# Patient Record
Sex: Female | Born: 1935 | Race: White | Hispanic: No | Smoking: Never smoker
Health system: Southern US, Community
[De-identification: ages and names within clinical notes are randomized; demographics above are authoritative.]

## PROBLEM LIST (undated history)

## (undated) DIAGNOSIS — D61818 Other pancytopenia: Secondary | ICD-10-CM

## (undated) DIAGNOSIS — F419 Anxiety disorder, unspecified: Secondary | ICD-10-CM

## (undated) DIAGNOSIS — A0472 Enterocolitis due to Clostridium difficile, not specified as recurrent: Secondary | ICD-10-CM

## (undated) DIAGNOSIS — F32A Depression, unspecified: Secondary | ICD-10-CM

## (undated) DIAGNOSIS — K219 Gastro-esophageal reflux disease without esophagitis: Secondary | ICD-10-CM

## (undated) DIAGNOSIS — I519 Heart disease, unspecified: Secondary | ICD-10-CM

## (undated) DIAGNOSIS — D469 Myelodysplastic syndrome, unspecified: Secondary | ICD-10-CM

## (undated) DIAGNOSIS — E785 Hyperlipidemia, unspecified: Secondary | ICD-10-CM

## (undated) DIAGNOSIS — Z860100 Personal history of colon polyps, unspecified: Secondary | ICD-10-CM

## (undated) DIAGNOSIS — D649 Anemia, unspecified: Secondary | ICD-10-CM

## (undated) DIAGNOSIS — M75100 Unspecified rotator cuff tear or rupture of unspecified shoulder, not specified as traumatic: Secondary | ICD-10-CM

## (undated) DIAGNOSIS — F329 Major depressive disorder, single episode, unspecified: Secondary | ICD-10-CM

## (undated) DIAGNOSIS — K5790 Diverticulosis of intestine, part unspecified, without perforation or abscess without bleeding: Secondary | ICD-10-CM

## (undated) DIAGNOSIS — I639 Cerebral infarction, unspecified: Secondary | ICD-10-CM

## (undated) DIAGNOSIS — I10 Essential (primary) hypertension: Secondary | ICD-10-CM

## (undated) DIAGNOSIS — M1990 Unspecified osteoarthritis, unspecified site: Secondary | ICD-10-CM

## (undated) DIAGNOSIS — S72009A Fracture of unspecified part of neck of unspecified femur, initial encounter for closed fracture: Secondary | ICD-10-CM

## (undated) DIAGNOSIS — I7300 Raynaud's syndrome without gangrene: Secondary | ICD-10-CM

## (undated) DIAGNOSIS — I509 Heart failure, unspecified: Secondary | ICD-10-CM

## (undated) DIAGNOSIS — K2960 Other gastritis without bleeding: Secondary | ICD-10-CM

## (undated) DIAGNOSIS — K222 Esophageal obstruction: Secondary | ICD-10-CM

## (undated) DIAGNOSIS — E7211 Homocystinuria: Secondary | ICD-10-CM

## (undated) DIAGNOSIS — N189 Chronic kidney disease, unspecified: Secondary | ICD-10-CM

## (undated) HISTORY — DX: Raynaud's syndrome without gangrene: I73.00

## (undated) HISTORY — DX: Major depressive disorder, single episode, unspecified: F32.9

## (undated) HISTORY — DX: Other gastritis without bleeding: K29.60

## (undated) HISTORY — DX: Esophageal obstruction: K22.2

## (undated) HISTORY — PX: CATARACT EXTRACTION: SUR2

## (undated) HISTORY — PX: HIP FRACTURE SURGERY: SHX118

## (undated) HISTORY — DX: Depression, unspecified: F32.A

## (undated) HISTORY — PX: SPINE SURGERY: SHX786

## (undated) HISTORY — DX: Personal history of colonic polyps: Z86.010

## (undated) HISTORY — PX: KIDNEY SURGERY: SHX687

## (undated) HISTORY — DX: Anemia, unspecified: D64.9

## (undated) HISTORY — DX: Heart failure, unspecified: I50.9

## (undated) HISTORY — DX: Diverticulosis of intestine, part unspecified, without perforation or abscess without bleeding: K57.90

## (undated) HISTORY — DX: Homocystinuria: E72.11

## (undated) HISTORY — DX: Anxiety disorder, unspecified: F41.9

## (undated) HISTORY — DX: Chronic kidney disease, unspecified: N18.9

## (undated) HISTORY — DX: Essential (primary) hypertension: I10

## (undated) HISTORY — DX: Fracture of unspecified part of neck of unspecified femur, initial encounter for closed fracture: S72.009A

## (undated) HISTORY — DX: Gastro-esophageal reflux disease without esophagitis: K21.9

## (undated) HISTORY — DX: Heart disease, unspecified: I51.9

## (undated) HISTORY — DX: Enterocolitis due to Clostridium difficile, not specified as recurrent: A04.72

## (undated) HISTORY — DX: Unspecified osteoarthritis, unspecified site: M19.90

## (undated) HISTORY — PX: TUBAL LIGATION: SHX77

## (undated) HISTORY — DX: Cerebral infarction, unspecified: I63.9

## (undated) HISTORY — DX: Myelodysplastic syndrome, unspecified: D46.9

## (undated) HISTORY — DX: Other pancytopenia: D61.818

## (undated) HISTORY — DX: Hyperlipidemia, unspecified: E78.5

## (undated) HISTORY — PX: VARICOSE VEIN SURGERY: SHX832

## (undated) HISTORY — PX: ABDOMINAL HYSTERECTOMY: SHX81

## (undated) HISTORY — DX: Personal history of colon polyps, unspecified: Z86.0100

---

## 1997-09-24 ENCOUNTER — Ambulatory Visit (HOSPITAL_COMMUNITY): Admission: RE | Admit: 1997-09-24 | Discharge: 1997-09-24 | Payer: Self-pay | Admitting: Orthopedic Surgery

## 1998-08-20 ENCOUNTER — Other Ambulatory Visit: Admission: RE | Admit: 1998-08-20 | Discharge: 1998-08-20 | Payer: Self-pay | Admitting: Obstetrics and Gynecology

## 1998-09-18 ENCOUNTER — Ambulatory Visit (HOSPITAL_COMMUNITY): Admission: RE | Admit: 1998-09-18 | Discharge: 1998-09-18 | Payer: Self-pay | Admitting: Internal Medicine

## 1998-09-18 ENCOUNTER — Encounter: Payer: Self-pay | Admitting: Internal Medicine

## 1999-05-09 ENCOUNTER — Encounter: Payer: Self-pay | Admitting: Orthopedic Surgery

## 1999-05-09 ENCOUNTER — Encounter: Admission: RE | Admit: 1999-05-09 | Discharge: 1999-05-09 | Payer: Self-pay | Admitting: Orthopedic Surgery

## 1999-05-12 ENCOUNTER — Ambulatory Visit (HOSPITAL_BASED_OUTPATIENT_CLINIC_OR_DEPARTMENT_OTHER): Admission: RE | Admit: 1999-05-12 | Discharge: 1999-05-12 | Payer: Self-pay | Admitting: Orthopedic Surgery

## 1999-06-11 ENCOUNTER — Encounter: Payer: Self-pay | Admitting: Internal Medicine

## 1999-06-11 ENCOUNTER — Ambulatory Visit (HOSPITAL_COMMUNITY): Admission: RE | Admit: 1999-06-11 | Discharge: 1999-06-11 | Payer: Self-pay | Admitting: Internal Medicine

## 2000-02-25 ENCOUNTER — Emergency Department (HOSPITAL_COMMUNITY): Admission: EM | Admit: 2000-02-25 | Discharge: 2000-02-25 | Payer: Self-pay | Admitting: Emergency Medicine

## 2003-04-16 ENCOUNTER — Encounter: Admission: RE | Admit: 2003-04-16 | Discharge: 2003-04-16 | Payer: Self-pay

## 2003-09-14 ENCOUNTER — Inpatient Hospital Stay (HOSPITAL_COMMUNITY): Admission: EM | Admit: 2003-09-14 | Discharge: 2003-09-18 | Payer: Self-pay

## 2003-09-17 ENCOUNTER — Encounter: Payer: Self-pay | Admitting: Cardiology

## 2003-09-18 ENCOUNTER — Inpatient Hospital Stay
Admission: RE | Admit: 2003-09-18 | Discharge: 2003-09-28 | Payer: Self-pay | Admitting: Physical Medicine & Rehabilitation

## 2003-11-01 ENCOUNTER — Emergency Department (HOSPITAL_COMMUNITY): Admission: EM | Admit: 2003-11-01 | Discharge: 2003-11-01 | Payer: Self-pay | Admitting: Emergency Medicine

## 2003-11-05 ENCOUNTER — Encounter
Admission: RE | Admit: 2003-11-05 | Discharge: 2004-02-03 | Payer: Self-pay | Admitting: Physical Medicine & Rehabilitation

## 2003-11-08 ENCOUNTER — Emergency Department (HOSPITAL_COMMUNITY): Admission: EM | Admit: 2003-11-08 | Discharge: 2003-11-08 | Payer: Self-pay | Admitting: Emergency Medicine

## 2003-11-20 ENCOUNTER — Emergency Department (HOSPITAL_COMMUNITY): Admission: EM | Admit: 2003-11-20 | Discharge: 2003-11-20 | Payer: Self-pay | Admitting: *Deleted

## 2004-01-15 ENCOUNTER — Encounter: Admission: RE | Admit: 2004-01-15 | Discharge: 2004-03-13 | Payer: Self-pay | Admitting: Neurology

## 2004-02-25 ENCOUNTER — Encounter
Admission: RE | Admit: 2004-02-25 | Discharge: 2004-05-25 | Payer: Self-pay | Admitting: Physical Medicine & Rehabilitation

## 2004-02-26 ENCOUNTER — Ambulatory Visit: Payer: Self-pay | Admitting: Physical Medicine & Rehabilitation

## 2004-03-20 ENCOUNTER — Ambulatory Visit: Payer: Self-pay | Admitting: Family Medicine

## 2004-04-03 ENCOUNTER — Ambulatory Visit: Payer: Self-pay | Admitting: Family Medicine

## 2004-04-14 ENCOUNTER — Ambulatory Visit: Payer: Self-pay | Admitting: Cardiology

## 2004-04-17 ENCOUNTER — Ambulatory Visit: Payer: Self-pay | Admitting: Family Medicine

## 2004-04-17 ENCOUNTER — Ambulatory Visit: Payer: Self-pay

## 2004-05-03 ENCOUNTER — Emergency Department (HOSPITAL_COMMUNITY): Admission: EM | Admit: 2004-05-03 | Discharge: 2004-05-03 | Payer: Self-pay | Admitting: Emergency Medicine

## 2004-06-24 ENCOUNTER — Ambulatory Visit: Payer: Self-pay | Admitting: Family Medicine

## 2004-07-07 ENCOUNTER — Encounter: Admission: RE | Admit: 2004-07-07 | Discharge: 2004-07-07 | Payer: Self-pay | Admitting: Neurology

## 2004-07-21 ENCOUNTER — Inpatient Hospital Stay (HOSPITAL_COMMUNITY): Admission: EM | Admit: 2004-07-21 | Discharge: 2004-07-26 | Payer: Self-pay | Admitting: Emergency Medicine

## 2004-07-21 ENCOUNTER — Ambulatory Visit: Payer: Self-pay | Admitting: Internal Medicine

## 2004-07-25 ENCOUNTER — Ambulatory Visit: Payer: Self-pay | Admitting: Internal Medicine

## 2004-07-31 ENCOUNTER — Ambulatory Visit: Payer: Self-pay | Admitting: Internal Medicine

## 2004-08-05 ENCOUNTER — Ambulatory Visit: Payer: Self-pay | Admitting: Internal Medicine

## 2004-08-05 ENCOUNTER — Encounter: Admission: RE | Admit: 2004-08-05 | Discharge: 2004-08-05 | Payer: Self-pay | Admitting: Internal Medicine

## 2004-08-22 ENCOUNTER — Ambulatory Visit: Payer: Self-pay | Admitting: Internal Medicine

## 2004-08-29 ENCOUNTER — Emergency Department (HOSPITAL_COMMUNITY): Admission: EM | Admit: 2004-08-29 | Discharge: 2004-08-29 | Payer: Self-pay

## 2004-09-01 ENCOUNTER — Ambulatory Visit: Payer: Self-pay | Admitting: Internal Medicine

## 2004-10-29 ENCOUNTER — Ambulatory Visit: Payer: Self-pay | Admitting: Internal Medicine

## 2004-11-26 ENCOUNTER — Encounter: Admission: RE | Admit: 2004-11-26 | Discharge: 2005-01-28 | Payer: Self-pay | Admitting: Internal Medicine

## 2004-12-09 ENCOUNTER — Ambulatory Visit: Payer: Self-pay | Admitting: Internal Medicine

## 2004-12-15 ENCOUNTER — Encounter: Admission: RE | Admit: 2004-12-15 | Discharge: 2004-12-15 | Payer: Self-pay | Admitting: Internal Medicine

## 2004-12-25 ENCOUNTER — Ambulatory Visit: Payer: Self-pay | Admitting: Internal Medicine

## 2004-12-26 ENCOUNTER — Ambulatory Visit: Payer: Self-pay | Admitting: Internal Medicine

## 2004-12-26 ENCOUNTER — Encounter (INDEPENDENT_AMBULATORY_CARE_PROVIDER_SITE_OTHER): Payer: Self-pay | Admitting: *Deleted

## 2005-02-03 ENCOUNTER — Ambulatory Visit: Payer: Self-pay | Admitting: Internal Medicine

## 2005-02-19 ENCOUNTER — Ambulatory Visit: Payer: Self-pay | Admitting: Internal Medicine

## 2005-03-09 ENCOUNTER — Ambulatory Visit: Payer: Self-pay | Admitting: Internal Medicine

## 2005-03-22 ENCOUNTER — Emergency Department (HOSPITAL_COMMUNITY): Admission: EM | Admit: 2005-03-22 | Discharge: 2005-03-22 | Payer: Self-pay | Admitting: Emergency Medicine

## 2005-03-30 ENCOUNTER — Emergency Department (HOSPITAL_COMMUNITY): Admission: EM | Admit: 2005-03-30 | Discharge: 2005-03-30 | Payer: Self-pay | Admitting: Emergency Medicine

## 2005-04-02 ENCOUNTER — Encounter: Admission: RE | Admit: 2005-04-02 | Discharge: 2005-04-02 | Payer: Self-pay | Admitting: Urology

## 2005-04-15 ENCOUNTER — Observation Stay (HOSPITAL_COMMUNITY): Admission: RE | Admit: 2005-04-15 | Discharge: 2005-04-16 | Payer: Self-pay | Admitting: Interventional Radiology

## 2005-04-15 ENCOUNTER — Encounter (INDEPENDENT_AMBULATORY_CARE_PROVIDER_SITE_OTHER): Payer: Self-pay | Admitting: Specialist

## 2005-05-14 ENCOUNTER — Ambulatory Visit: Payer: Self-pay | Admitting: Internal Medicine

## 2005-05-21 ENCOUNTER — Encounter: Admission: RE | Admit: 2005-05-21 | Discharge: 2005-05-21 | Payer: Self-pay | Admitting: Interventional Radiology

## 2005-05-21 ENCOUNTER — Ambulatory Visit: Payer: Self-pay | Admitting: Internal Medicine

## 2005-05-23 ENCOUNTER — Encounter: Admission: RE | Admit: 2005-05-23 | Discharge: 2005-05-23 | Payer: Self-pay | Admitting: Interventional Radiology

## 2005-06-04 ENCOUNTER — Ambulatory Visit: Payer: Self-pay | Admitting: Internal Medicine

## 2005-06-11 ENCOUNTER — Encounter: Admission: RE | Admit: 2005-06-11 | Discharge: 2005-06-11 | Payer: Self-pay | Admitting: Interventional Radiology

## 2005-06-15 ENCOUNTER — Encounter: Admission: RE | Admit: 2005-06-15 | Discharge: 2005-06-15 | Payer: Self-pay | Admitting: Internal Medicine

## 2005-07-06 ENCOUNTER — Encounter (HOSPITAL_COMMUNITY): Admission: RE | Admit: 2005-07-06 | Discharge: 2005-10-04 | Payer: Self-pay | Admitting: Nephrology

## 2005-07-15 ENCOUNTER — Ambulatory Visit: Payer: Self-pay | Admitting: Internal Medicine

## 2005-07-29 ENCOUNTER — Ambulatory Visit: Payer: Self-pay | Admitting: Internal Medicine

## 2005-07-29 ENCOUNTER — Ambulatory Visit: Payer: Self-pay | Admitting: Physical Medicine & Rehabilitation

## 2005-07-29 ENCOUNTER — Inpatient Hospital Stay (HOSPITAL_COMMUNITY): Admission: EM | Admit: 2005-07-29 | Discharge: 2005-08-03 | Payer: Self-pay | Admitting: Emergency Medicine

## 2005-08-03 ENCOUNTER — Inpatient Hospital Stay (HOSPITAL_COMMUNITY)
Admission: RE | Admit: 2005-08-03 | Discharge: 2005-08-18 | Payer: Self-pay | Admitting: Physical Medicine & Rehabilitation

## 2005-08-03 ENCOUNTER — Ambulatory Visit: Payer: Self-pay | Admitting: Physical Medicine & Rehabilitation

## 2005-10-27 ENCOUNTER — Encounter: Admission: RE | Admit: 2005-10-27 | Discharge: 2005-10-27 | Payer: Self-pay | Admitting: Interventional Radiology

## 2005-12-24 ENCOUNTER — Emergency Department (HOSPITAL_COMMUNITY): Admission: EM | Admit: 2005-12-24 | Discharge: 2005-12-24 | Payer: Self-pay | Admitting: Emergency Medicine

## 2006-02-13 ENCOUNTER — Emergency Department (HOSPITAL_COMMUNITY): Admission: EM | Admit: 2006-02-13 | Discharge: 2006-02-13 | Payer: Self-pay | Admitting: Emergency Medicine

## 2006-03-05 ENCOUNTER — Ambulatory Visit: Payer: Self-pay | Admitting: Internal Medicine

## 2006-04-05 ENCOUNTER — Encounter: Payer: Self-pay | Admitting: Internal Medicine

## 2006-04-07 ENCOUNTER — Ambulatory Visit: Payer: Self-pay | Admitting: Internal Medicine

## 2006-04-29 ENCOUNTER — Encounter: Admission: RE | Admit: 2006-04-29 | Discharge: 2006-04-29 | Payer: Self-pay | Admitting: Interventional Radiology

## 2006-04-30 ENCOUNTER — Encounter (HOSPITAL_COMMUNITY): Admission: RE | Admit: 2006-04-30 | Discharge: 2006-05-27 | Payer: Self-pay | Admitting: Internal Medicine

## 2006-06-08 ENCOUNTER — Ambulatory Visit: Payer: Self-pay | Admitting: Internal Medicine

## 2006-06-10 ENCOUNTER — Ambulatory Visit: Payer: Self-pay | Admitting: Internal Medicine

## 2006-06-10 ENCOUNTER — Encounter: Payer: Self-pay | Admitting: Internal Medicine

## 2006-06-10 ENCOUNTER — Encounter (INDEPENDENT_AMBULATORY_CARE_PROVIDER_SITE_OTHER): Payer: Self-pay | Admitting: *Deleted

## 2006-07-02 ENCOUNTER — Ambulatory Visit: Payer: Self-pay | Admitting: Internal Medicine

## 2006-07-30 ENCOUNTER — Encounter (HOSPITAL_COMMUNITY): Admission: RE | Admit: 2006-07-30 | Discharge: 2006-08-02 | Payer: Self-pay | Admitting: Internal Medicine

## 2006-08-25 ENCOUNTER — Encounter: Payer: Self-pay | Admitting: Internal Medicine

## 2006-08-25 LAB — CONVERTED CEMR LAB: INR: 2

## 2006-09-21 DIAGNOSIS — I5020 Unspecified systolic (congestive) heart failure: Secondary | ICD-10-CM | POA: Insufficient documentation

## 2006-09-21 DIAGNOSIS — M810 Age-related osteoporosis without current pathological fracture: Secondary | ICD-10-CM | POA: Insufficient documentation

## 2006-09-21 DIAGNOSIS — I10 Essential (primary) hypertension: Secondary | ICD-10-CM | POA: Insufficient documentation

## 2006-09-21 DIAGNOSIS — H409 Unspecified glaucoma: Secondary | ICD-10-CM | POA: Insufficient documentation

## 2006-09-21 DIAGNOSIS — E785 Hyperlipidemia, unspecified: Secondary | ICD-10-CM

## 2006-10-11 ENCOUNTER — Ambulatory Visit: Payer: Self-pay | Admitting: Internal Medicine

## 2006-10-12 ENCOUNTER — Encounter: Payer: Self-pay | Admitting: Internal Medicine

## 2006-11-02 ENCOUNTER — Ambulatory Visit: Payer: Self-pay | Admitting: Internal Medicine

## 2006-11-09 ENCOUNTER — Ambulatory Visit: Payer: Self-pay | Admitting: Internal Medicine

## 2006-11-09 DIAGNOSIS — N189 Chronic kidney disease, unspecified: Secondary | ICD-10-CM

## 2006-11-09 DIAGNOSIS — I6350 Cerebral infarction due to unspecified occlusion or stenosis of unspecified cerebral artery: Secondary | ICD-10-CM | POA: Insufficient documentation

## 2006-11-09 DIAGNOSIS — D0910 Carcinoma in situ of unspecified urinary organ: Secondary | ICD-10-CM | POA: Insufficient documentation

## 2006-11-17 ENCOUNTER — Ambulatory Visit: Payer: Self-pay | Admitting: Internal Medicine

## 2006-11-18 ENCOUNTER — Encounter: Payer: Self-pay | Admitting: Internal Medicine

## 2006-11-19 ENCOUNTER — Telehealth (INDEPENDENT_AMBULATORY_CARE_PROVIDER_SITE_OTHER): Payer: Self-pay | Admitting: *Deleted

## 2006-11-22 ENCOUNTER — Encounter: Payer: Self-pay | Admitting: Internal Medicine

## 2006-11-22 LAB — CONVERTED CEMR LAB
Cholesterol: 188 mg/dL (ref 0–200)
HDL: 51 mg/dL (ref >39.0)
Total CHOL/HDL Ratio: 3.7
Triglycerides: 71 mg/dL (ref 0–149)

## 2006-11-23 ENCOUNTER — Encounter (INDEPENDENT_AMBULATORY_CARE_PROVIDER_SITE_OTHER): Payer: Self-pay | Admitting: *Deleted

## 2006-11-23 ENCOUNTER — Encounter: Payer: Self-pay | Admitting: Internal Medicine

## 2006-11-30 ENCOUNTER — Telehealth: Payer: Self-pay | Admitting: Internal Medicine

## 2006-12-07 ENCOUNTER — Ambulatory Visit: Payer: Self-pay

## 2006-12-07 ENCOUNTER — Encounter: Payer: Self-pay | Admitting: Internal Medicine

## 2006-12-10 ENCOUNTER — Telehealth: Payer: Self-pay | Admitting: Internal Medicine

## 2006-12-16 ENCOUNTER — Encounter (INDEPENDENT_AMBULATORY_CARE_PROVIDER_SITE_OTHER): Payer: Self-pay | Admitting: *Deleted

## 2006-12-18 ENCOUNTER — Telehealth (INDEPENDENT_AMBULATORY_CARE_PROVIDER_SITE_OTHER): Payer: Self-pay | Admitting: *Deleted

## 2007-01-06 ENCOUNTER — Telehealth: Payer: Self-pay | Admitting: Internal Medicine

## 2007-02-04 ENCOUNTER — Telehealth (INDEPENDENT_AMBULATORY_CARE_PROVIDER_SITE_OTHER): Payer: Self-pay | Admitting: *Deleted

## 2007-02-22 ENCOUNTER — Encounter: Payer: Self-pay | Admitting: Internal Medicine

## 2007-03-15 ENCOUNTER — Telehealth: Payer: Self-pay | Admitting: Internal Medicine

## 2007-03-16 ENCOUNTER — Encounter (INDEPENDENT_AMBULATORY_CARE_PROVIDER_SITE_OTHER): Payer: Self-pay | Admitting: *Deleted

## 2007-03-28 ENCOUNTER — Inpatient Hospital Stay (HOSPITAL_COMMUNITY): Admission: EM | Admit: 2007-03-28 | Discharge: 2007-04-01 | Payer: Self-pay | Admitting: Emergency Medicine

## 2007-03-30 ENCOUNTER — Ambulatory Visit: Payer: Self-pay | Admitting: Physical Medicine & Rehabilitation

## 2007-04-06 ENCOUNTER — Telehealth (INDEPENDENT_AMBULATORY_CARE_PROVIDER_SITE_OTHER): Payer: Self-pay | Admitting: *Deleted

## 2007-05-16 ENCOUNTER — Telehealth: Payer: Self-pay | Admitting: Internal Medicine

## 2007-05-19 ENCOUNTER — Telehealth (INDEPENDENT_AMBULATORY_CARE_PROVIDER_SITE_OTHER): Payer: Self-pay | Admitting: *Deleted

## 2007-05-24 ENCOUNTER — Ambulatory Visit: Payer: Self-pay | Admitting: Internal Medicine

## 2007-05-25 ENCOUNTER — Telehealth: Payer: Self-pay | Admitting: Internal Medicine

## 2007-05-31 ENCOUNTER — Telehealth (INDEPENDENT_AMBULATORY_CARE_PROVIDER_SITE_OTHER): Payer: Self-pay | Admitting: *Deleted

## 2007-05-31 ENCOUNTER — Encounter: Payer: Self-pay | Admitting: Internal Medicine

## 2007-06-28 ENCOUNTER — Telehealth: Payer: Self-pay | Admitting: Internal Medicine

## 2007-06-29 ENCOUNTER — Encounter: Payer: Self-pay | Admitting: Internal Medicine

## 2007-07-07 ENCOUNTER — Telehealth: Payer: Self-pay | Admitting: Internal Medicine

## 2007-07-08 ENCOUNTER — Telehealth (INDEPENDENT_AMBULATORY_CARE_PROVIDER_SITE_OTHER): Payer: Self-pay | Admitting: *Deleted

## 2007-07-14 ENCOUNTER — Encounter: Payer: Self-pay | Admitting: Internal Medicine

## 2007-07-19 ENCOUNTER — Encounter: Payer: Self-pay | Admitting: Internal Medicine

## 2007-07-26 ENCOUNTER — Encounter: Admission: RE | Admit: 2007-07-26 | Discharge: 2007-07-26 | Payer: Self-pay | Admitting: Interventional Radiology

## 2007-08-05 ENCOUNTER — Encounter (INDEPENDENT_AMBULATORY_CARE_PROVIDER_SITE_OTHER): Payer: Self-pay | Admitting: *Deleted

## 2007-08-05 ENCOUNTER — Encounter: Payer: Self-pay | Admitting: Internal Medicine

## 2007-08-11 ENCOUNTER — Encounter: Admission: RE | Admit: 2007-08-11 | Discharge: 2007-08-11 | Payer: Self-pay | Admitting: Interventional Radiology

## 2007-08-22 ENCOUNTER — Ambulatory Visit: Payer: Self-pay | Admitting: Internal Medicine

## 2007-08-22 DIAGNOSIS — F341 Dysthymic disorder: Secondary | ICD-10-CM

## 2007-08-26 ENCOUNTER — Telehealth (INDEPENDENT_AMBULATORY_CARE_PROVIDER_SITE_OTHER): Payer: Self-pay | Admitting: *Deleted

## 2007-08-29 ENCOUNTER — Encounter: Payer: Self-pay | Admitting: Internal Medicine

## 2007-09-01 ENCOUNTER — Telehealth (INDEPENDENT_AMBULATORY_CARE_PROVIDER_SITE_OTHER): Payer: Self-pay | Admitting: *Deleted

## 2007-09-16 ENCOUNTER — Telehealth (INDEPENDENT_AMBULATORY_CARE_PROVIDER_SITE_OTHER): Payer: Self-pay | Admitting: *Deleted

## 2007-09-19 ENCOUNTER — Ambulatory Visit: Payer: Self-pay | Admitting: Internal Medicine

## 2007-09-19 DIAGNOSIS — M109 Gout, unspecified: Secondary | ICD-10-CM

## 2007-09-23 ENCOUNTER — Telehealth (INDEPENDENT_AMBULATORY_CARE_PROVIDER_SITE_OTHER): Payer: Self-pay | Admitting: *Deleted

## 2007-09-23 LAB — CONVERTED CEMR LAB
ALT: 10 units/L (ref 0–35)
Albumin: 3.2 g/dL — ABNORMAL LOW (ref 3.5–5.2)
BUN: 51 mg/dL — ABNORMAL HIGH (ref 6–23)
Calcium: 9 mg/dL (ref 8.4–10.5)
Creatinine, Ser: 2 mg/dL — ABNORMAL HIGH (ref 0.4–1.2)
GFR calc Af Amer: 31 mL/min
Glucose, Bld: 114 mg/dL — ABNORMAL HIGH (ref 70–99)
Sodium: 139 meq/L (ref 135–145)
Total Protein: 6.3 g/dL (ref 6.0–8.3)
Uric Acid, Serum: 12 mg/dL — ABNORMAL HIGH (ref 2.4–7.0)

## 2007-09-26 ENCOUNTER — Telehealth (INDEPENDENT_AMBULATORY_CARE_PROVIDER_SITE_OTHER): Payer: Self-pay | Admitting: *Deleted

## 2007-09-28 ENCOUNTER — Ambulatory Visit: Payer: Self-pay | Admitting: Internal Medicine

## 2007-09-28 LAB — CONVERTED CEMR LAB
Bacteria, UA: NONE SEEN
Bilirubin Urine: NEGATIVE
Glucose, Urine, Semiquant: NEGATIVE
RBC / HPF: NONE SEEN (ref <3)
Specific Gravity, Urine: 1.01
pH: 5

## 2007-09-29 ENCOUNTER — Encounter: Payer: Self-pay | Admitting: Internal Medicine

## 2007-10-05 ENCOUNTER — Telehealth (INDEPENDENT_AMBULATORY_CARE_PROVIDER_SITE_OTHER): Payer: Self-pay | Admitting: *Deleted

## 2007-10-06 ENCOUNTER — Encounter: Payer: Self-pay | Admitting: Internal Medicine

## 2007-10-14 ENCOUNTER — Telehealth (INDEPENDENT_AMBULATORY_CARE_PROVIDER_SITE_OTHER): Payer: Self-pay | Admitting: *Deleted

## 2007-10-18 ENCOUNTER — Telehealth (INDEPENDENT_AMBULATORY_CARE_PROVIDER_SITE_OTHER): Payer: Self-pay | Admitting: *Deleted

## 2007-10-31 ENCOUNTER — Encounter: Payer: Self-pay | Admitting: Internal Medicine

## 2007-10-31 ENCOUNTER — Encounter: Admission: RE | Admit: 2007-10-31 | Discharge: 2007-10-31 | Payer: Self-pay | Admitting: Neurological Surgery

## 2007-11-17 ENCOUNTER — Telehealth: Payer: Self-pay | Admitting: Internal Medicine

## 2007-12-08 ENCOUNTER — Inpatient Hospital Stay (HOSPITAL_COMMUNITY): Admission: AD | Admit: 2007-12-08 | Discharge: 2007-12-09 | Payer: Self-pay | Admitting: Neurological Surgery

## 2008-01-09 ENCOUNTER — Encounter: Admission: RE | Admit: 2008-01-09 | Discharge: 2008-01-09 | Payer: Self-pay | Admitting: Neurological Surgery

## 2008-01-19 ENCOUNTER — Encounter: Payer: Self-pay | Admitting: Internal Medicine

## 2008-02-21 ENCOUNTER — Telehealth: Payer: Self-pay | Admitting: Internal Medicine

## 2008-02-24 ENCOUNTER — Encounter: Admission: RE | Admit: 2008-02-24 | Discharge: 2008-02-24 | Payer: Self-pay | Admitting: Neurological Surgery

## 2008-03-09 DIAGNOSIS — Z8719 Personal history of other diseases of the digestive system: Secondary | ICD-10-CM

## 2008-03-09 DIAGNOSIS — K5730 Diverticulosis of large intestine without perforation or abscess without bleeding: Secondary | ICD-10-CM | POA: Insufficient documentation

## 2008-03-09 DIAGNOSIS — K219 Gastro-esophageal reflux disease without esophagitis: Secondary | ICD-10-CM | POA: Insufficient documentation

## 2008-03-09 DIAGNOSIS — K449 Diaphragmatic hernia without obstruction or gangrene: Secondary | ICD-10-CM | POA: Insufficient documentation

## 2008-03-13 ENCOUNTER — Telehealth (INDEPENDENT_AMBULATORY_CARE_PROVIDER_SITE_OTHER): Payer: Self-pay | Admitting: *Deleted

## 2008-03-14 ENCOUNTER — Encounter: Payer: Self-pay | Admitting: Internal Medicine

## 2008-04-06 ENCOUNTER — Telehealth (INDEPENDENT_AMBULATORY_CARE_PROVIDER_SITE_OTHER): Payer: Self-pay | Admitting: *Deleted

## 2008-04-09 ENCOUNTER — Ambulatory Visit: Payer: Self-pay | Admitting: Internal Medicine

## 2008-04-12 ENCOUNTER — Ambulatory Visit: Payer: Self-pay | Admitting: Internal Medicine

## 2008-04-19 ENCOUNTER — Ambulatory Visit: Payer: Self-pay | Admitting: Internal Medicine

## 2008-04-23 ENCOUNTER — Telehealth: Payer: Self-pay | Admitting: Internal Medicine

## 2008-04-24 ENCOUNTER — Telehealth (INDEPENDENT_AMBULATORY_CARE_PROVIDER_SITE_OTHER): Payer: Self-pay | Admitting: *Deleted

## 2008-04-24 LAB — CONVERTED CEMR LAB
TSH: 1.51 microintl units/mL (ref 0.35–5.50)
Total CHOL/HDL Ratio: 3.5

## 2008-05-01 ENCOUNTER — Encounter: Admission: RE | Admit: 2008-05-01 | Discharge: 2008-05-01 | Payer: Self-pay | Admitting: Neurological Surgery

## 2008-05-08 ENCOUNTER — Telehealth (INDEPENDENT_AMBULATORY_CARE_PROVIDER_SITE_OTHER): Payer: Self-pay | Admitting: *Deleted

## 2008-05-09 ENCOUNTER — Ambulatory Visit: Payer: Self-pay | Admitting: Family Medicine

## 2008-05-24 ENCOUNTER — Telehealth: Payer: Self-pay | Admitting: Family Medicine

## 2008-05-24 ENCOUNTER — Encounter (INDEPENDENT_AMBULATORY_CARE_PROVIDER_SITE_OTHER): Payer: Self-pay | Admitting: *Deleted

## 2008-05-26 LAB — CONVERTED CEMR LAB
CO2: 32 meq/L (ref 19–32)
Calcium: 8.9 mg/dL (ref 8.4–10.5)
Chloride: 107 meq/L (ref 96–112)
Creatinine, Ser: 2 mg/dL — ABNORMAL HIGH (ref 0.4–1.2)
GFR calc non Af Amer: 25 mL/min
Sodium: 143 meq/L (ref 135–145)
Uric Acid, Serum: 7.5 mg/dL — ABNORMAL HIGH (ref 2.4–7.0)

## 2008-06-08 ENCOUNTER — Telehealth (INDEPENDENT_AMBULATORY_CARE_PROVIDER_SITE_OTHER): Payer: Self-pay | Admitting: *Deleted

## 2008-06-11 ENCOUNTER — Telehealth (INDEPENDENT_AMBULATORY_CARE_PROVIDER_SITE_OTHER): Payer: Self-pay | Admitting: *Deleted

## 2008-06-14 ENCOUNTER — Ambulatory Visit: Payer: Self-pay | Admitting: Internal Medicine

## 2008-06-26 ENCOUNTER — Encounter: Admission: RE | Admit: 2008-06-26 | Discharge: 2008-06-26 | Payer: Self-pay | Admitting: Interventional Radiology

## 2008-06-26 ENCOUNTER — Ambulatory Visit (HOSPITAL_COMMUNITY): Admission: RE | Admit: 2008-06-26 | Discharge: 2008-06-26 | Payer: Self-pay | Admitting: Interventional Radiology

## 2008-07-19 ENCOUNTER — Encounter: Admission: RE | Admit: 2008-07-19 | Discharge: 2008-07-19 | Payer: Self-pay | Admitting: Internal Medicine

## 2008-07-23 ENCOUNTER — Encounter (INDEPENDENT_AMBULATORY_CARE_PROVIDER_SITE_OTHER): Payer: Self-pay | Admitting: *Deleted

## 2008-07-26 ENCOUNTER — Telehealth (INDEPENDENT_AMBULATORY_CARE_PROVIDER_SITE_OTHER): Payer: Self-pay | Admitting: *Deleted

## 2008-09-11 ENCOUNTER — Encounter: Payer: Self-pay | Admitting: Internal Medicine

## 2008-09-17 ENCOUNTER — Telehealth (INDEPENDENT_AMBULATORY_CARE_PROVIDER_SITE_OTHER): Payer: Self-pay | Admitting: *Deleted

## 2008-09-21 ENCOUNTER — Ambulatory Visit: Payer: Self-pay | Admitting: Oncology

## 2008-09-28 ENCOUNTER — Ambulatory Visit: Payer: Self-pay | Admitting: Internal Medicine

## 2008-10-03 ENCOUNTER — Other Ambulatory Visit: Admission: RE | Admit: 2008-10-03 | Discharge: 2008-10-03 | Payer: Self-pay | Admitting: Oncology

## 2008-10-03 ENCOUNTER — Encounter: Payer: Self-pay | Admitting: Oncology

## 2008-10-03 ENCOUNTER — Encounter: Payer: Self-pay | Admitting: Internal Medicine

## 2008-10-03 LAB — CBC WITH DIFFERENTIAL/PLATELET
Basophils Absolute: 0 10*3/uL (ref 0.0–0.1)
Eosinophils Absolute: 0.1 10*3/uL (ref 0.0–0.5)
HCT: 27 % — ABNORMAL LOW (ref 34.8–46.6)
HGB: 9.5 g/dL — ABNORMAL LOW (ref 11.6–15.9)
MONO#: 0.1 10*3/uL (ref 0.1–0.9)
NEUT#: 0.9 10*3/uL — ABNORMAL LOW (ref 1.5–6.5)
NEUT%: 31.7 % — ABNORMAL LOW (ref 38.4–76.8)
WBC: 2.9 10*3/uL — ABNORMAL LOW (ref 3.9–10.3)
lymph#: 1.8 10*3/uL (ref 0.9–3.3)

## 2008-10-03 LAB — COMPREHENSIVE METABOLIC PANEL
ALT: 8 U/L (ref 0–35)
Albumin: 3.8 g/dL (ref 3.5–5.2)
CO2: 24 mEq/L (ref 19–32)
Glucose, Bld: 94 mg/dL (ref 70–99)
Potassium: 5.2 mEq/L (ref 3.5–5.3)
Sodium: 142 mEq/L (ref 135–145)
Total Protein: 6.5 g/dL (ref 6.0–8.3)

## 2008-10-03 LAB — LACTATE DEHYDROGENASE: LDH: 183 U/L (ref 94–250)

## 2008-10-09 LAB — FLOW CYTOMETRY

## 2008-10-15 ENCOUNTER — Ambulatory Visit (HOSPITAL_COMMUNITY): Admission: RE | Admit: 2008-10-15 | Discharge: 2008-10-15 | Payer: Self-pay | Admitting: Oncology

## 2008-10-15 ENCOUNTER — Ambulatory Visit: Payer: Self-pay | Admitting: Oncology

## 2008-10-15 ENCOUNTER — Encounter: Payer: Self-pay | Admitting: Oncology

## 2008-10-19 ENCOUNTER — Encounter: Payer: Self-pay | Admitting: Internal Medicine

## 2008-10-22 ENCOUNTER — Encounter: Admission: RE | Admit: 2008-10-22 | Discharge: 2008-12-06 | Payer: Self-pay | Admitting: Internal Medicine

## 2008-11-02 ENCOUNTER — Telehealth (INDEPENDENT_AMBULATORY_CARE_PROVIDER_SITE_OTHER): Payer: Self-pay | Admitting: *Deleted

## 2008-11-08 ENCOUNTER — Encounter: Payer: Self-pay | Admitting: Internal Medicine

## 2008-11-16 ENCOUNTER — Telehealth: Payer: Self-pay | Admitting: Internal Medicine

## 2008-11-19 ENCOUNTER — Ambulatory Visit: Payer: Self-pay | Admitting: Internal Medicine

## 2008-11-19 LAB — CONVERTED CEMR LAB
Blood in Urine, dipstick: NEGATIVE
Glucose, Urine, Semiquant: NEGATIVE
Ketones, urine, test strip: NEGATIVE
Nitrite: NEGATIVE
WBC Urine, dipstick: NEGATIVE
pH: 5

## 2008-11-20 ENCOUNTER — Encounter: Payer: Self-pay | Admitting: Internal Medicine

## 2008-11-20 ENCOUNTER — Ambulatory Visit: Payer: Self-pay | Admitting: Oncology

## 2008-11-20 LAB — CONVERTED CEMR LAB
RBC / HPF: NONE SEEN (ref <3)
WBC, UA: NONE SEEN cells/hpf (ref <3)

## 2008-11-22 ENCOUNTER — Encounter: Payer: Self-pay | Admitting: Internal Medicine

## 2008-11-22 LAB — CBC WITH DIFFERENTIAL/PLATELET
BASO%: 0.4 % (ref 0.0–2.0)
EOS%: 3.1 % (ref 0.0–7.0)
HCT: 27.3 % — ABNORMAL LOW (ref 34.8–46.6)
HGB: 9.6 g/dL — ABNORMAL LOW (ref 11.6–15.9)
MCH: 39.1 pg — ABNORMAL HIGH (ref 25.1–34.0)
MCHC: 35.3 g/dL (ref 31.5–36.0)
MONO#: 0.1 10*3/uL (ref 0.1–0.9)
NEUT%: 35.2 % — ABNORMAL LOW (ref 38.4–76.8)
RDW: 14.6 % — ABNORMAL HIGH (ref 11.2–14.5)
WBC: 2.4 10*3/uL — ABNORMAL LOW (ref 3.9–10.3)
lymph#: 1.4 10*3/uL (ref 0.9–3.3)

## 2008-11-26 LAB — SPEP & IFE WITH QIG
Albumin ELP: 59.6 % (ref 55.8–66.1)
Alpha-1-Globulin: 7 % — ABNORMAL HIGH (ref 2.9–4.9)
Beta 2: 3 % — ABNORMAL LOW (ref 3.2–6.5)
IgM, Serum: 76 mg/dL (ref 60–263)
Total Protein, Serum Electrophoresis: 6.5 g/dL (ref 6.0–8.3)

## 2008-11-26 LAB — DIRECT ANTIGLOBULIN TEST (NOT AT ARMC): DAT (Complement): NEGATIVE

## 2008-11-26 LAB — ANA: Anti Nuclear Antibody(ANA): NEGATIVE

## 2008-12-06 ENCOUNTER — Telehealth (INDEPENDENT_AMBULATORY_CARE_PROVIDER_SITE_OTHER): Payer: Self-pay | Admitting: *Deleted

## 2008-12-10 ENCOUNTER — Telehealth (INDEPENDENT_AMBULATORY_CARE_PROVIDER_SITE_OTHER): Payer: Self-pay | Admitting: *Deleted

## 2008-12-11 ENCOUNTER — Encounter: Payer: Self-pay | Admitting: Internal Medicine

## 2008-12-28 ENCOUNTER — Telehealth (INDEPENDENT_AMBULATORY_CARE_PROVIDER_SITE_OTHER): Payer: Self-pay | Admitting: *Deleted

## 2008-12-31 ENCOUNTER — Telehealth (INDEPENDENT_AMBULATORY_CARE_PROVIDER_SITE_OTHER): Payer: Self-pay | Admitting: *Deleted

## 2009-01-11 ENCOUNTER — Telehealth (INDEPENDENT_AMBULATORY_CARE_PROVIDER_SITE_OTHER): Payer: Self-pay | Admitting: *Deleted

## 2009-01-23 ENCOUNTER — Encounter: Admission: RE | Admit: 2009-01-23 | Discharge: 2009-01-23 | Payer: Self-pay | Admitting: Orthopedic Surgery

## 2009-01-25 ENCOUNTER — Ambulatory Visit: Payer: Self-pay | Admitting: Oncology

## 2009-01-29 ENCOUNTER — Encounter: Payer: Self-pay | Admitting: Internal Medicine

## 2009-01-29 LAB — CBC WITH DIFFERENTIAL/PLATELET
Basophils Absolute: 0 10*3/uL (ref 0.0–0.1)
EOS%: 2.4 % (ref 0.0–7.0)
Eosinophils Absolute: 0.1 10*3/uL (ref 0.0–0.5)
HCT: 23.4 % — ABNORMAL LOW (ref 34.8–46.6)
HGB: 8.3 g/dL — ABNORMAL LOW (ref 11.6–15.9)
MCH: 40 pg — ABNORMAL HIGH (ref 25.1–34.0)
MCV: 112.9 fL — ABNORMAL HIGH (ref 79.5–101.0)
MONO%: 1.6 % (ref 0.0–14.0)
NEUT#: 0.7 10*3/uL — ABNORMAL LOW (ref 1.5–6.5)
NEUT%: 29.7 % — ABNORMAL LOW (ref 38.4–76.8)
Platelets: 51 10*3/uL — ABNORMAL LOW (ref 145–400)

## 2009-01-29 LAB — FOLATE: Folate: >20 ng/mL

## 2009-01-29 LAB — IRON AND TIBC
%SAT: 42 % (ref 20–55)
UIBC: 157 ug/dL

## 2009-02-15 ENCOUNTER — Encounter: Payer: Self-pay | Admitting: Internal Medicine

## 2009-02-15 ENCOUNTER — Ambulatory Visit: Payer: Self-pay | Admitting: Oncology

## 2009-02-15 LAB — CBC WITH DIFFERENTIAL/PLATELET
Basophils Absolute: 0 10*3/uL (ref 0.0–0.1)
EOS%: 0.8 % (ref 0.0–7.0)
Eosinophils Absolute: 0 10*3/uL (ref 0.0–0.5)
HCT: 21.5 % — ABNORMAL LOW (ref 34.8–46.6)
HGB: 8.2 g/dL — ABNORMAL LOW (ref 11.6–15.9)
MCH: 43.5 pg — ABNORMAL HIGH (ref 25.1–34.0)
NEUT#: 0.9 10*3/uL — ABNORMAL LOW (ref 1.5–6.5)
NEUT%: 35.9 % — ABNORMAL LOW (ref 38.4–76.8)
lymph#: 1.5 10*3/uL (ref 0.9–3.3)

## 2009-02-25 ENCOUNTER — Encounter (HOSPITAL_COMMUNITY): Admission: RE | Admit: 2009-02-25 | Discharge: 2009-02-28 | Payer: Self-pay | Admitting: Oncology

## 2009-02-25 ENCOUNTER — Ambulatory Visit: Payer: Self-pay | Admitting: Oncology

## 2009-02-25 LAB — CBC WITH DIFFERENTIAL/PLATELET
Basophils Absolute: 0 10*3/uL (ref 0.0–0.1)
Eosinophils Absolute: 0 10*3/uL (ref 0.0–0.5)
HCT: 21.7 % — ABNORMAL LOW (ref 34.8–46.6)
HGB: 7.6 g/dL — ABNORMAL LOW (ref 11.6–15.9)
LYMPH%: 62.6 % — ABNORMAL HIGH (ref 14.0–49.7)
MCV: 115.6 fL — ABNORMAL HIGH (ref 79.5–101.0)
MONO#: 0 10*3/uL — ABNORMAL LOW (ref 0.1–0.9)
MONO%: 1.9 % (ref 0.0–14.0)
NEUT#: 0.7 10*3/uL — ABNORMAL LOW (ref 1.5–6.5)
NEUT%: 33.4 % — ABNORMAL LOW (ref 38.4–76.8)
Platelets: 39 10*3/uL — ABNORMAL LOW (ref 145–400)
RBC: 1.87 10*6/uL — ABNORMAL LOW (ref 3.70–5.45)

## 2009-03-14 ENCOUNTER — Encounter: Payer: Self-pay | Admitting: Internal Medicine

## 2009-03-14 LAB — CBC WITH DIFFERENTIAL/PLATELET
BASO%: 0.2 % (ref 0.0–2.0)
HCT: 24.6 % — ABNORMAL LOW (ref 34.8–46.6)
MCHC: 35.8 g/dL (ref 31.5–36.0)
MONO#: 0 10*3/uL — ABNORMAL LOW (ref 0.1–0.9)
RBC: 2.3 10*6/uL — ABNORMAL LOW (ref 3.70–5.45)
WBC: 2.8 10*3/uL — ABNORMAL LOW (ref 3.9–10.3)
lymph#: 1.7 10*3/uL (ref 0.9–3.3)

## 2009-03-14 LAB — RENAL FUNCTION PANEL
CO2: 23 mEq/L (ref 19–32)
Chloride: 105 mEq/L (ref 96–112)
Sodium: 138 mEq/L (ref 135–145)

## 2009-03-18 ENCOUNTER — Encounter: Payer: Self-pay | Admitting: Internal Medicine

## 2009-03-18 LAB — CONVERTED CEMR LAB
Calcium: 9.8 mg/dL
Glucose, Bld: 98 mg/dL
Sodium, serum: 140 mmol/L

## 2009-03-22 ENCOUNTER — Encounter: Payer: Self-pay | Admitting: Internal Medicine

## 2009-03-22 LAB — CONVERTED CEMR LAB
BUN: 63 mg/dL
CO2, serum: 28 mmol/L
Calcium: 9.2 mg/dL
Creatinine, Ser: 2.29 mg/dL
Glucose, Bld: 83 mg/dL
Sodium, serum: 141 mmol/L

## 2009-03-28 ENCOUNTER — Ambulatory Visit: Payer: Self-pay | Admitting: Oncology

## 2009-03-28 LAB — CBC WITH DIFFERENTIAL/PLATELET
Eosinophils Absolute: 0.1 10*3/uL (ref 0.0–0.5)
HCT: 24 % — ABNORMAL LOW (ref 34.8–46.6)
LYMPH%: 77.2 % — ABNORMAL HIGH (ref 14.0–49.7)
MONO#: 0.1 10*3/uL (ref 0.1–0.9)
NEUT#: 0.6 10*3/uL — ABNORMAL LOW (ref 1.5–6.5)
NEUT%: 18.6 % — ABNORMAL LOW (ref 38.4–76.8)
Platelets: 39 10*3/uL — ABNORMAL LOW (ref 145–400)
RBC: 2.27 10*6/uL — ABNORMAL LOW (ref 3.70–5.45)
WBC: 3.4 10*3/uL — ABNORMAL LOW (ref 3.9–10.3)
nRBC: 0 % (ref 0–0)

## 2009-04-08 ENCOUNTER — Ambulatory Visit: Payer: Self-pay | Admitting: Internal Medicine

## 2009-04-08 DIAGNOSIS — D469 Myelodysplastic syndrome, unspecified: Secondary | ICD-10-CM

## 2009-04-09 ENCOUNTER — Telehealth: Payer: Self-pay | Admitting: Internal Medicine

## 2009-04-11 ENCOUNTER — Telehealth (INDEPENDENT_AMBULATORY_CARE_PROVIDER_SITE_OTHER): Payer: Self-pay | Admitting: *Deleted

## 2009-04-11 ENCOUNTER — Encounter: Payer: Self-pay | Admitting: Internal Medicine

## 2009-04-11 ENCOUNTER — Encounter (HOSPITAL_COMMUNITY): Admission: RE | Admit: 2009-04-11 | Discharge: 2009-05-30 | Payer: Self-pay | Admitting: Oncology

## 2009-04-11 LAB — CBC WITH DIFFERENTIAL/PLATELET
BASO%: 0.4 % (ref 0.0–2.0)
EOS%: 4.3 % (ref 0.0–7.0)
HCT: 20.3 % — ABNORMAL LOW (ref 34.8–46.6)
MCH: 39 pg — ABNORMAL HIGH (ref 25.1–34.0)
MCHC: 35 g/dL (ref 31.5–36.0)
NEUT%: 19.5 % — ABNORMAL LOW (ref 38.4–76.8)
RBC: 1.82 10*6/uL — ABNORMAL LOW (ref 3.70–5.45)
RDW: 22.4 % — ABNORMAL HIGH (ref 11.2–14.5)
lymph#: 1.5 10*3/uL (ref 0.9–3.3)

## 2009-04-12 LAB — VITAMIN B12: Vitamin B-12: 623 pg/mL (ref 211–911)

## 2009-04-12 LAB — TYPE & CROSSMATCH - CHCC

## 2009-04-13 ENCOUNTER — Telehealth: Payer: Self-pay | Admitting: Family Medicine

## 2009-04-15 ENCOUNTER — Telehealth: Payer: Self-pay | Admitting: Internal Medicine

## 2009-04-16 ENCOUNTER — Telehealth: Payer: Self-pay | Admitting: Internal Medicine

## 2009-04-17 ENCOUNTER — Telehealth: Payer: Self-pay | Admitting: Internal Medicine

## 2009-04-23 ENCOUNTER — Ambulatory Visit: Payer: Self-pay | Admitting: Internal Medicine

## 2009-04-24 ENCOUNTER — Encounter: Payer: Self-pay | Admitting: Internal Medicine

## 2009-04-24 LAB — CBC WITH DIFFERENTIAL/PLATELET
BASO%: 0.2 % (ref 0.0–2.0)
LYMPH%: 43.9 % (ref 14.0–49.7)
MCHC: 34.2 g/dL (ref 31.5–36.0)
MONO#: 0 10*3/uL — ABNORMAL LOW (ref 0.1–0.9)
MONO%: 1.1 % (ref 0.0–14.0)
Platelets: 35 10*3/uL — ABNORMAL LOW (ref 145–400)
RBC: 2.6 10*6/uL — ABNORMAL LOW (ref 3.70–5.45)
WBC: 1.9 10*3/uL — ABNORMAL LOW (ref 3.9–10.3)

## 2009-04-28 ENCOUNTER — Telehealth: Payer: Self-pay | Admitting: Family Medicine

## 2009-04-29 ENCOUNTER — Telehealth: Payer: Self-pay | Admitting: Internal Medicine

## 2009-04-29 ENCOUNTER — Ambulatory Visit: Payer: Self-pay | Admitting: Internal Medicine

## 2009-04-30 ENCOUNTER — Telehealth (INDEPENDENT_AMBULATORY_CARE_PROVIDER_SITE_OTHER): Payer: Self-pay | Admitting: *Deleted

## 2009-04-30 LAB — CONVERTED CEMR LAB
CO2: 31 meq/L (ref 19–32)
Chloride: 105 meq/L (ref 96–112)
Creatinine, Ser: 1.7 mg/dL — ABNORMAL HIGH (ref 0.4–1.2)
Potassium: 4.2 meq/L (ref 3.5–5.1)
Sodium: 143 meq/L (ref 135–145)

## 2009-05-01 ENCOUNTER — Encounter: Payer: Self-pay | Admitting: Internal Medicine

## 2009-05-01 ENCOUNTER — Telehealth: Payer: Self-pay | Admitting: Internal Medicine

## 2009-05-03 ENCOUNTER — Telehealth: Payer: Self-pay | Admitting: Internal Medicine

## 2009-05-06 ENCOUNTER — Ambulatory Visit: Payer: Self-pay | Admitting: Oncology

## 2009-05-07 ENCOUNTER — Ambulatory Visit: Payer: Self-pay | Admitting: Internal Medicine

## 2009-05-07 ENCOUNTER — Telehealth: Payer: Self-pay | Admitting: Internal Medicine

## 2009-05-08 LAB — CONVERTED CEMR LAB
BUN: 60 mg/dL — ABNORMAL HIGH (ref 6–23)
CO2: 29 meq/L (ref 19–32)
Chloride: 107 meq/L (ref 96–112)
GFR calc non Af Amer: 20.55 mL/min (ref >60)
Glucose, Bld: 101 mg/dL — ABNORMAL HIGH (ref 70–99)
Potassium: 4.4 meq/L (ref 3.5–5.1)
Sodium: 144 meq/L (ref 135–145)

## 2009-05-09 ENCOUNTER — Telehealth: Payer: Self-pay | Admitting: Internal Medicine

## 2009-05-09 ENCOUNTER — Encounter: Payer: Self-pay | Admitting: Internal Medicine

## 2009-05-09 LAB — CBC WITH DIFFERENTIAL/PLATELET
BASO%: 0.4 % (ref 0.0–2.0)
Basophils Absolute: 0 10*3/uL (ref 0.0–0.1)
EOS%: 9.3 % — ABNORMAL HIGH (ref 0.0–7.0)
HCT: 27.3 % — ABNORMAL LOW (ref 34.8–46.6)
HGB: 9.4 g/dL — ABNORMAL LOW (ref 11.6–15.9)
MCH: 37 pg — ABNORMAL HIGH (ref 25.1–34.0)
MCHC: 34.5 g/dL (ref 31.5–36.0)
MCV: 107.5 fL — ABNORMAL HIGH (ref 79.5–101.0)
MONO%: 1.9 % (ref 0.0–14.0)
NEUT%: 30.7 % — ABNORMAL LOW (ref 38.4–76.8)

## 2009-05-13 ENCOUNTER — Ambulatory Visit: Payer: Self-pay | Admitting: Internal Medicine

## 2009-05-15 ENCOUNTER — Telehealth (INDEPENDENT_AMBULATORY_CARE_PROVIDER_SITE_OTHER): Payer: Self-pay | Admitting: *Deleted

## 2009-05-20 ENCOUNTER — Telehealth: Payer: Self-pay | Admitting: Internal Medicine

## 2009-05-22 ENCOUNTER — Encounter: Payer: Self-pay | Admitting: Internal Medicine

## 2009-05-22 ENCOUNTER — Ambulatory Visit: Payer: Self-pay | Admitting: Oncology

## 2009-05-22 LAB — CBC WITH DIFFERENTIAL/PLATELET
BASO%: 0.5 % (ref 0.0–2.0)
EOS%: 1.9 % (ref 0.0–7.0)
LYMPH%: 40.3 % (ref 14.0–49.7)
MCH: 38.4 pg — ABNORMAL HIGH (ref 25.1–34.0)
MCHC: 35.1 g/dL (ref 31.5–36.0)
MCV: 109.3 fL — ABNORMAL HIGH (ref 79.5–101.0)
MONO%: 1.7 % (ref 0.0–14.0)
Platelets: 48 10*3/uL — ABNORMAL LOW (ref 145–400)
RBC: 2.31 10*6/uL — ABNORMAL LOW (ref 3.70–5.45)
RDW: 23.6 % — ABNORMAL HIGH (ref 11.2–14.5)

## 2009-05-28 ENCOUNTER — Ambulatory Visit: Payer: Self-pay | Admitting: Internal Medicine

## 2009-05-29 LAB — CONVERTED CEMR LAB
CO2: 29 meq/L (ref 19–32)
Calcium: 9.4 mg/dL (ref 8.4–10.5)
Chloride: 109 meq/L (ref 96–112)
Creatinine, Ser: 2.5 mg/dL — ABNORMAL HIGH (ref 0.4–1.2)
Glucose, Bld: 103 mg/dL — ABNORMAL HIGH (ref 70–99)

## 2009-06-03 ENCOUNTER — Ambulatory Visit: Payer: Self-pay | Admitting: Oncology

## 2009-06-04 ENCOUNTER — Ambulatory Visit: Payer: Self-pay | Admitting: Internal Medicine

## 2009-06-10 ENCOUNTER — Encounter (HOSPITAL_COMMUNITY): Admission: RE | Admit: 2009-06-10 | Discharge: 2009-09-08 | Payer: Self-pay | Admitting: Oncology

## 2009-06-10 LAB — CBC WITH DIFFERENTIAL/PLATELET
BASO%: 0.6 % (ref 0.0–2.0)
Eosinophils Absolute: 0 10*3/uL (ref 0.0–0.5)
HCT: 18.7 % — ABNORMAL LOW (ref 34.8–46.6)
MCHC: 35.5 g/dL (ref 31.5–36.0)
MONO#: 0 10*3/uL — ABNORMAL LOW (ref 0.1–0.9)
NEUT#: 0.8 10*3/uL — ABNORMAL LOW (ref 1.5–6.5)
NEUT%: 49 % (ref 38.4–76.8)
Platelets: 41 10*3/uL — ABNORMAL LOW (ref 145–400)
WBC: 1.5 10*3/uL — ABNORMAL LOW (ref 3.9–10.3)
lymph#: 0.7 10*3/uL — ABNORMAL LOW (ref 0.9–3.3)

## 2009-06-11 LAB — TYPE & CROSSMATCH - CHCC

## 2009-06-12 ENCOUNTER — Ambulatory Visit: Payer: Self-pay | Admitting: Internal Medicine

## 2009-06-12 LAB — CONVERTED CEMR LAB
Basophils Absolute: 0 10*3/uL (ref 0.0–0.1)
Eosinophils Relative: 5 % (ref 0.0–5.0)
HCT: 27.5 % — ABNORMAL LOW (ref 36.0–46.0)
Hemoglobin: 9.4 g/dL — ABNORMAL LOW (ref 12.0–15.0)
Lymphs Abs: 1.2 10*3/uL (ref 0.7–4.0)
MCV: 101.5 fL — ABNORMAL HIGH (ref 78.0–100.0)
Monocytes Absolute: 0 10*3/uL — ABNORMAL LOW (ref 0.1–1.0)
Monocytes Relative: 2 % — ABNORMAL LOW (ref 3.0–12.0)
Neutro Abs: 0.8 10*3/uL — ABNORMAL LOW (ref 1.4–7.7)
RDW: 26.5 % — ABNORMAL HIGH (ref 11.5–14.6)
Rhuematoid fact SerPl-aCnc: 20 intl units/mL (ref 0.0–20.0)

## 2009-06-17 ENCOUNTER — Telehealth: Payer: Self-pay | Admitting: Internal Medicine

## 2009-06-21 ENCOUNTER — Encounter: Payer: Self-pay | Admitting: Internal Medicine

## 2009-06-21 LAB — CBC WITH DIFFERENTIAL/PLATELET
Basophils Absolute: 0 10*3/uL (ref 0.0–0.1)
Eosinophils Absolute: 0.2 10*3/uL (ref 0.0–0.5)
HGB: 9.1 g/dL — ABNORMAL LOW (ref 11.6–15.9)
MONO#: 0 10*3/uL — ABNORMAL LOW (ref 0.1–0.9)
NEUT#: 0.8 10*3/uL — ABNORMAL LOW (ref 1.5–6.5)
RBC: 2.72 10*6/uL — ABNORMAL LOW (ref 3.70–5.45)
RDW: 23.3 % — ABNORMAL HIGH (ref 11.2–14.5)
WBC: 2.9 10*3/uL — ABNORMAL LOW (ref 3.9–10.3)
nRBC: 0 % (ref 0–0)

## 2009-06-24 ENCOUNTER — Telehealth: Payer: Self-pay | Admitting: Internal Medicine

## 2009-07-02 ENCOUNTER — Encounter: Payer: Self-pay | Admitting: Internal Medicine

## 2009-07-03 ENCOUNTER — Encounter (INDEPENDENT_AMBULATORY_CARE_PROVIDER_SITE_OTHER): Payer: Self-pay | Admitting: Internal Medicine

## 2009-07-03 ENCOUNTER — Ambulatory Visit: Payer: Self-pay | Admitting: Oncology

## 2009-07-04 ENCOUNTER — Encounter (INDEPENDENT_AMBULATORY_CARE_PROVIDER_SITE_OTHER): Payer: Self-pay | Admitting: *Deleted

## 2009-07-08 ENCOUNTER — Encounter: Payer: Self-pay | Admitting: Internal Medicine

## 2009-07-09 ENCOUNTER — Telehealth: Payer: Self-pay | Admitting: Internal Medicine

## 2009-07-15 ENCOUNTER — Ambulatory Visit: Payer: Self-pay | Admitting: Oncology

## 2009-07-17 ENCOUNTER — Encounter: Payer: Self-pay | Admitting: Internal Medicine

## 2009-07-17 LAB — CBC WITH DIFFERENTIAL/PLATELET
BASO%: 0.3 % (ref 0.0–2.0)
Eosinophils Absolute: 0.1 10*3/uL (ref 0.0–0.5)
MCHC: 33.4 g/dL (ref 31.5–36.0)
MCV: 95.8 fL (ref 79.5–101.0)
MONO%: 2.3 % (ref 0.0–14.0)
NEUT#: 1 10*3/uL — ABNORMAL LOW (ref 1.5–6.5)
RBC: 3.06 10*6/uL — ABNORMAL LOW (ref 3.70–5.45)
RDW: 21.6 % — ABNORMAL HIGH (ref 11.2–14.5)
WBC: 3.4 10*3/uL — ABNORMAL LOW (ref 3.9–10.3)
nRBC: 0 % (ref 0–0)

## 2009-07-18 ENCOUNTER — Encounter: Payer: Self-pay | Admitting: Internal Medicine

## 2009-07-22 ENCOUNTER — Telehealth: Payer: Self-pay | Admitting: Internal Medicine

## 2009-07-23 ENCOUNTER — Telehealth: Payer: Self-pay | Admitting: Internal Medicine

## 2009-07-30 ENCOUNTER — Encounter: Payer: Self-pay | Admitting: Internal Medicine

## 2009-07-31 ENCOUNTER — Ambulatory Visit: Payer: Self-pay | Admitting: Internal Medicine

## 2009-07-31 LAB — CBC WITH DIFFERENTIAL/PLATELET
BASO%: 0.3 % (ref 0.0–2.0)
HCT: 27.1 % — ABNORMAL LOW (ref 34.8–46.6)
MCHC: 32.8 g/dL (ref 31.5–36.0)
MONO#: 0.1 10*3/uL (ref 0.1–0.9)
NEUT%: 43.2 % (ref 38.4–76.8)
RBC: 2.71 10*6/uL — ABNORMAL LOW (ref 3.70–5.45)
WBC: 3.9 10*3/uL (ref 3.9–10.3)
lymph#: 2 10*3/uL (ref 0.9–3.3)
nRBC: 0 % (ref 0–0)

## 2009-08-09 ENCOUNTER — Emergency Department (HOSPITAL_COMMUNITY)
Admission: EM | Admit: 2009-08-09 | Discharge: 2009-08-09 | Payer: Self-pay | Source: Home / Self Care | Admitting: Emergency Medicine

## 2009-08-09 ENCOUNTER — Ambulatory Visit: Payer: Self-pay | Admitting: Oncology

## 2009-08-09 ENCOUNTER — Telehealth: Payer: Self-pay | Admitting: Internal Medicine

## 2009-08-13 ENCOUNTER — Encounter: Payer: Self-pay | Admitting: Internal Medicine

## 2009-08-13 LAB — CBC WITH DIFFERENTIAL/PLATELET
Basophils Absolute: 0 10*3/uL (ref 0.0–0.1)
Eosinophils Absolute: 0.1 10*3/uL (ref 0.0–0.5)
HGB: 8.4 g/dL — ABNORMAL LOW (ref 11.6–15.9)
MONO#: 0 10*3/uL — ABNORMAL LOW (ref 0.1–0.9)
NEUT#: 0.8 10*3/uL — ABNORMAL LOW (ref 1.5–6.5)
RDW: 23.9 % — ABNORMAL HIGH (ref 11.2–14.5)
lymph#: 1.7 10*3/uL (ref 0.9–3.3)

## 2009-08-13 LAB — T4, FREE: Free T4: 0.91 ng/dL (ref 0.80–1.80)

## 2009-08-13 LAB — T4: T4, Total: 8.4 ug/dL (ref 5.0–12.5)

## 2009-08-13 LAB — TSH: TSH: 1.477 u[IU]/mL (ref 0.350–4.500)

## 2009-08-13 LAB — T3: T3, Total: 99.8 ng/dL (ref 80.0–204.0)

## 2009-08-21 ENCOUNTER — Encounter: Admission: RE | Admit: 2009-08-21 | Discharge: 2009-08-21 | Payer: Self-pay | Admitting: Interventional Radiology

## 2009-08-22 ENCOUNTER — Encounter: Payer: Self-pay | Admitting: Internal Medicine

## 2009-08-22 LAB — CONVERTED CEMR LAB
BUN: 33 mg/dL
Chloride, Serum: 108 mmol/L
Creatinine, Ser: 1.66 mg/dL
Glucose, Bld: 91 mg/dL
Potassium, serum: 4.7 mmol/L

## 2009-08-23 ENCOUNTER — Encounter: Admission: RE | Admit: 2009-08-23 | Discharge: 2009-08-23 | Payer: Self-pay | Admitting: Internal Medicine

## 2009-08-28 LAB — CBC WITH DIFFERENTIAL/PLATELET
Basophils Absolute: 0 10*3/uL (ref 0.0–0.1)
EOS%: 5 % (ref 0.0–7.0)
LYMPH%: 65.9 % — ABNORMAL HIGH (ref 14.0–49.7)
MCH: 34.9 pg — ABNORMAL HIGH (ref 25.1–34.0)
MCV: 101.2 fL — ABNORMAL HIGH (ref 79.5–101.0)
MONO%: 2.5 % (ref 0.0–14.0)
RBC: 3.14 10*6/uL — ABNORMAL LOW (ref 3.70–5.45)
RDW: 25 % — ABNORMAL HIGH (ref 11.2–14.5)

## 2009-08-29 ENCOUNTER — Encounter: Admission: RE | Admit: 2009-08-29 | Discharge: 2009-08-29 | Payer: Self-pay | Admitting: Internal Medicine

## 2009-08-29 LAB — HM MAMMOGRAPHY: HM Mammogram: NORMAL

## 2009-09-02 ENCOUNTER — Ambulatory Visit: Payer: Self-pay | Admitting: Oncology

## 2009-09-02 ENCOUNTER — Ambulatory Visit (HOSPITAL_COMMUNITY): Admission: RE | Admit: 2009-09-02 | Discharge: 2009-09-02 | Payer: Self-pay | Admitting: Oncology

## 2009-09-05 ENCOUNTER — Telehealth: Payer: Self-pay | Admitting: Internal Medicine

## 2009-09-05 ENCOUNTER — Encounter: Payer: Self-pay | Admitting: Internal Medicine

## 2009-09-11 ENCOUNTER — Ambulatory Visit: Payer: Self-pay | Admitting: Oncology

## 2009-09-11 ENCOUNTER — Encounter (HOSPITAL_COMMUNITY): Admission: RE | Admit: 2009-09-11 | Discharge: 2009-12-10 | Payer: Self-pay | Admitting: Oncology

## 2009-09-11 LAB — CBC WITH DIFFERENTIAL/PLATELET
Eosinophils Absolute: 0.1 10*3/uL (ref 0.0–0.5)
HCT: 28.6 % — ABNORMAL LOW (ref 34.8–46.6)
LYMPH%: 44.7 % (ref 14.0–49.7)
MCHC: 32.2 g/dL (ref 31.5–36.0)
MCV: 104.4 fL — ABNORMAL HIGH (ref 79.5–101.0)
MONO#: 0.1 10*3/uL (ref 0.1–0.9)
MONO%: 3.1 % (ref 0.0–14.0)
NEUT#: 1.3 10*3/uL — ABNORMAL LOW (ref 1.5–6.5)
NEUT%: 49.8 % (ref 38.4–76.8)
Platelets: 27 10*3/uL — ABNORMAL LOW (ref 145–400)
WBC: 2.6 10*3/uL — ABNORMAL LOW (ref 3.9–10.3)

## 2009-09-26 ENCOUNTER — Encounter: Payer: Self-pay | Admitting: Internal Medicine

## 2009-09-26 LAB — CBC WITH DIFFERENTIAL/PLATELET
Eosinophils Absolute: 0.1 10*3/uL (ref 0.0–0.5)
MCV: 106.6 fL — ABNORMAL HIGH (ref 79.5–101.0)
MONO#: 0 10*3/uL — ABNORMAL LOW (ref 0.1–0.9)
MONO%: 1.5 % (ref 0.0–14.0)
NEUT#: 0.7 10*3/uL — ABNORMAL LOW (ref 1.5–6.5)
RBC: 2.71 10*6/uL — ABNORMAL LOW (ref 3.70–5.45)
RDW: 20.7 % — ABNORMAL HIGH (ref 11.2–14.5)
WBC: 2 10*3/uL — ABNORMAL LOW (ref 3.9–10.3)
nRBC: 0 % (ref 0–0)

## 2009-10-10 LAB — CBC WITH DIFFERENTIAL/PLATELET
BASO%: 0 % (ref 0.0–2.0)
Basophils Absolute: 0 10*3/uL (ref 0.0–0.1)
EOS%: 5.6 % (ref 0.0–7.0)
HCT: 29.3 % — ABNORMAL LOW (ref 34.8–46.6)
HGB: 9.7 g/dL — ABNORMAL LOW (ref 11.6–15.9)
LYMPH%: 53.7 % — ABNORMAL HIGH (ref 14.0–49.7)
MCH: 35.5 pg — ABNORMAL HIGH (ref 25.1–34.0)
MCHC: 33.1 g/dL (ref 31.5–36.0)
MCV: 107.3 fL — ABNORMAL HIGH (ref 79.5–101.0)
MONO%: 3.1 % (ref 0.0–14.0)
NEUT%: 37.6 % — ABNORMAL LOW (ref 38.4–76.8)

## 2009-10-14 ENCOUNTER — Ambulatory Visit: Payer: Self-pay | Admitting: Oncology

## 2009-10-15 ENCOUNTER — Ambulatory Visit: Payer: Self-pay | Admitting: Gastroenterology

## 2009-10-15 ENCOUNTER — Telehealth: Payer: Self-pay | Admitting: Internal Medicine

## 2009-10-15 ENCOUNTER — Inpatient Hospital Stay (HOSPITAL_COMMUNITY): Admission: AD | Admit: 2009-10-15 | Discharge: 2009-10-18 | Payer: Self-pay | Admitting: Gastroenterology

## 2009-10-15 DIAGNOSIS — Z860100 Personal history of colon polyps, unspecified: Secondary | ICD-10-CM | POA: Insufficient documentation

## 2009-10-16 ENCOUNTER — Encounter: Payer: Self-pay | Admitting: Gastroenterology

## 2009-10-16 ENCOUNTER — Ambulatory Visit: Payer: Self-pay | Admitting: Gastroenterology

## 2009-10-17 ENCOUNTER — Encounter: Payer: Self-pay | Admitting: Gastroenterology

## 2009-10-21 ENCOUNTER — Telehealth (INDEPENDENT_AMBULATORY_CARE_PROVIDER_SITE_OTHER): Payer: Self-pay | Admitting: *Deleted

## 2009-10-21 ENCOUNTER — Ambulatory Visit: Payer: Self-pay | Admitting: Oncology

## 2009-10-22 ENCOUNTER — Encounter: Payer: Self-pay | Admitting: Internal Medicine

## 2009-10-22 LAB — CBC WITH DIFFERENTIAL/PLATELET
EOS%: 5.7 % (ref 0.0–7.0)
Eosinophils Absolute: 0.1 10*3/uL (ref 0.0–0.5)
LYMPH%: 56.5 % — ABNORMAL HIGH (ref 14.0–49.7)
MCH: 38.4 pg — ABNORMAL HIGH (ref 25.1–34.0)
MCHC: 34.8 g/dL (ref 31.5–36.0)
MCV: 110.4 fL — ABNORMAL HIGH (ref 79.5–101.0)
MONO%: 2.4 % (ref 0.0–14.0)
Platelets: 27 10*3/uL — ABNORMAL LOW (ref 145–400)
RBC: 2.43 10*6/uL — ABNORMAL LOW (ref 3.70–5.45)
RDW: 19 % — ABNORMAL HIGH (ref 11.2–14.5)

## 2009-10-22 LAB — COMPREHENSIVE METABOLIC PANEL
AST: 20 U/L (ref 0–37)
Alkaline Phosphatase: 236 U/L — ABNORMAL HIGH (ref 39–117)
Glucose, Bld: 91 mg/dL (ref 70–99)
Potassium: 4.6 mEq/L (ref 3.5–5.3)
Sodium: 143 mEq/L (ref 135–145)
Total Bilirubin: 0.5 mg/dL (ref 0.3–1.2)
Total Protein: 5.3 g/dL — ABNORMAL LOW (ref 6.0–8.3)

## 2009-10-24 ENCOUNTER — Encounter (INDEPENDENT_AMBULATORY_CARE_PROVIDER_SITE_OTHER): Payer: Self-pay | Admitting: *Deleted

## 2009-10-24 ENCOUNTER — Encounter: Payer: Self-pay | Admitting: Internal Medicine

## 2009-10-25 ENCOUNTER — Telehealth: Payer: Self-pay | Admitting: Internal Medicine

## 2009-10-25 ENCOUNTER — Encounter: Payer: Self-pay | Admitting: Internal Medicine

## 2009-10-25 ENCOUNTER — Encounter (INDEPENDENT_AMBULATORY_CARE_PROVIDER_SITE_OTHER): Payer: Self-pay | Admitting: Internal Medicine

## 2009-10-25 ENCOUNTER — Inpatient Hospital Stay (HOSPITAL_COMMUNITY): Admission: EM | Admit: 2009-10-25 | Discharge: 2009-10-27 | Payer: Self-pay | Admitting: Emergency Medicine

## 2009-10-27 ENCOUNTER — Encounter (INDEPENDENT_AMBULATORY_CARE_PROVIDER_SITE_OTHER): Payer: Self-pay | Admitting: *Deleted

## 2009-10-28 ENCOUNTER — Encounter (INDEPENDENT_AMBULATORY_CARE_PROVIDER_SITE_OTHER): Payer: Self-pay | Admitting: *Deleted

## 2009-10-28 ENCOUNTER — Inpatient Hospital Stay (HOSPITAL_COMMUNITY)
Admission: EM | Admit: 2009-10-28 | Discharge: 2009-11-05 | Disposition: A | Discharge status: Nursing Home (Includes ICF) | Payer: Self-pay | Source: Home / Self Care | Admitting: Emergency Medicine

## 2009-11-01 ENCOUNTER — Encounter: Payer: Self-pay | Admitting: Gastroenterology

## 2009-11-01 ENCOUNTER — Encounter: Payer: Self-pay | Admitting: Internal Medicine

## 2009-11-05 ENCOUNTER — Institutional Professional Consult (permissible substitution): Admission: RE | Admit: 2009-11-05 | Discharge: 2009-11-23 | Payer: Self-pay | Admitting: Internal Medicine

## 2009-11-20 ENCOUNTER — Ambulatory Visit: Payer: Self-pay | Admitting: Oncology

## 2009-12-03 ENCOUNTER — Telehealth: Payer: Self-pay | Admitting: Internal Medicine

## 2009-12-03 ENCOUNTER — Ambulatory Visit: Payer: Self-pay | Admitting: Internal Medicine

## 2009-12-03 LAB — CONVERTED CEMR LAB
Basophils Absolute: 0 10*3/uL (ref 0.0–0.1)
Eosinophils Absolute: 0 10*3/uL (ref 0.0–0.7)
HCT: 26.2 % — ABNORMAL LOW (ref 36.0–46.0)
Lymphs Abs: 1.9 10*3/uL (ref 0.7–4.0)
MCV: 113 fL — ABNORMAL HIGH (ref 78.0–100.0)
Monocytes Absolute: 0 10*3/uL — ABNORMAL LOW (ref 0.1–1.0)
Platelets: 31 10*3/uL — CL (ref 150.0–400.0)
RDW: 15.6 % — ABNORMAL HIGH (ref 11.5–14.6)

## 2009-12-04 ENCOUNTER — Telehealth: Payer: Self-pay | Admitting: Internal Medicine

## 2009-12-04 ENCOUNTER — Encounter: Payer: Self-pay | Admitting: Internal Medicine

## 2009-12-04 LAB — CBC WITH DIFFERENTIAL/PLATELET
Basophils Absolute: 0 10*3/uL (ref 0.0–0.1)
Eosinophils Absolute: 0 10*3/uL (ref 0.0–0.5)
HGB: 8.5 g/dL — ABNORMAL LOW (ref 11.6–15.9)
LYMPH%: 70.8 % — ABNORMAL HIGH (ref 14.0–49.7)
MCV: 112.7 fL — ABNORMAL HIGH (ref 79.5–101.0)
MONO#: 0 10*3/uL — ABNORMAL LOW (ref 0.1–0.9)
MONO%: 1 % (ref 0.0–14.0)
NEUT#: 0.6 10*3/uL — ABNORMAL LOW (ref 1.5–6.5)
Platelets: 23 10*3/uL — ABNORMAL LOW (ref 145–400)
WBC: 2.3 10*3/uL — ABNORMAL LOW (ref 3.9–10.3)

## 2009-12-09 ENCOUNTER — Telehealth: Payer: Self-pay | Admitting: Internal Medicine

## 2009-12-11 ENCOUNTER — Telehealth: Payer: Self-pay | Admitting: Internal Medicine

## 2009-12-13 ENCOUNTER — Ambulatory Visit: Payer: Self-pay | Admitting: Internal Medicine

## 2009-12-13 LAB — CONVERTED CEMR LAB
Glucose, Urine, Semiquant: NEGATIVE
Protein, U semiquant: 100
Specific Gravity, Urine: 1.015
WBC Urine, dipstick: NEGATIVE
pH: 5

## 2009-12-20 ENCOUNTER — Ambulatory Visit: Payer: Self-pay | Admitting: Oncology

## 2009-12-20 ENCOUNTER — Encounter (HOSPITAL_COMMUNITY)
Admission: RE | Admit: 2009-12-20 | Discharge: 2010-01-23 | Payer: Self-pay | Source: Home / Self Care | Admitting: Oncology

## 2009-12-20 ENCOUNTER — Encounter: Payer: Self-pay | Admitting: Internal Medicine

## 2009-12-20 LAB — CBC WITH DIFFERENTIAL/PLATELET
Eosinophils Absolute: 0 10*3/uL (ref 0.0–0.5)
HCT: 21.7 % — ABNORMAL LOW (ref 34.8–46.6)
LYMPH%: 73 % — ABNORMAL HIGH (ref 14.0–49.7)
MCHC: 35.5 g/dL (ref 31.5–36.0)
MCV: 114.1 fL — ABNORMAL HIGH (ref 79.5–101.0)
MONO#: 0 10*3/uL — ABNORMAL LOW (ref 0.1–0.9)
MONO%: 1.2 % (ref 0.0–14.0)
NEUT#: 0.7 10*3/uL — ABNORMAL LOW (ref 1.5–6.5)
NEUT%: 24.5 % — ABNORMAL LOW (ref 38.4–76.8)
Platelets: 34 10*3/uL — ABNORMAL LOW (ref 145–400)
RBC: 1.9 10*6/uL — ABNORMAL LOW (ref 3.70–5.45)
WBC: 2.7 10*3/uL — ABNORMAL LOW (ref 3.9–10.3)

## 2009-12-23 LAB — TYPE & CROSSMATCH - CHCC

## 2009-12-27 ENCOUNTER — Ambulatory Visit: Payer: Self-pay | Admitting: Internal Medicine

## 2009-12-27 ENCOUNTER — Telehealth: Payer: Self-pay | Admitting: Internal Medicine

## 2009-12-30 LAB — CONVERTED CEMR LAB
Basophils Relative: 0.2 % (ref 0.0–3.0)
Bilirubin, Direct: 0.1 mg/dL (ref 0.0–0.3)
CO2: 24 meq/L (ref 19–32)
Calcium: 9.1 mg/dL (ref 8.4–10.5)
Creatinine, Ser: 1.8 mg/dL — ABNORMAL HIGH (ref 0.4–1.2)
Eosinophils Absolute: 0 10*3/uL (ref 0.0–0.7)
Hemoglobin: 9.2 g/dL — ABNORMAL LOW (ref 12.0–15.0)
Lymphocytes Relative: 67.5 % — ABNORMAL HIGH (ref 12.0–46.0)
MCHC: 34.4 g/dL (ref 30.0–36.0)
Monocytes Relative: 2.5 % — ABNORMAL LOW (ref 3.0–12.0)
Neutro Abs: 0.6 10*3/uL — ABNORMAL LOW (ref 1.4–7.7)
RBC: 2.48 M/uL — ABNORMAL LOW (ref 3.87–5.11)
Total Protein: 5.9 g/dL — ABNORMAL LOW (ref 6.0–8.3)

## 2010-01-01 ENCOUNTER — Telehealth: Payer: Self-pay | Admitting: Internal Medicine

## 2010-01-02 ENCOUNTER — Ambulatory Visit: Payer: Self-pay | Admitting: Internal Medicine

## 2010-01-02 LAB — CBC WITH DIFFERENTIAL/PLATELET
Basophils Absolute: 0 10*3/uL (ref 0.0–0.1)
Eosinophils Absolute: 0 10*3/uL (ref 0.0–0.5)
HCT: 24.1 % — ABNORMAL LOW (ref 34.8–46.6)
LYMPH%: 73.5 % — ABNORMAL HIGH (ref 14.0–49.7)
MCV: 105.7 fL — ABNORMAL HIGH (ref 79.5–101.0)
MONO#: 0 10*3/uL — ABNORMAL LOW (ref 0.1–0.9)
MONO%: 1.9 % (ref 0.0–14.0)
NEUT#: 0.5 10*3/uL — ABNORMAL LOW (ref 1.5–6.5)
NEUT%: 22.8 % — ABNORMAL LOW (ref 38.4–76.8)
Platelets: 25 10*3/uL — ABNORMAL LOW (ref 145–400)
RBC: 2.28 10*6/uL — ABNORMAL LOW (ref 3.70–5.45)
WBC: 2.3 10*3/uL — ABNORMAL LOW (ref 3.9–10.3)

## 2010-01-07 ENCOUNTER — Telehealth: Payer: Self-pay | Admitting: Internal Medicine

## 2010-01-15 ENCOUNTER — Telehealth: Payer: Self-pay | Admitting: Internal Medicine

## 2010-01-16 LAB — CBC WITH DIFFERENTIAL/PLATELET
BASO%: 0.2 % (ref 0.0–2.0)
HCT: 21.9 % — ABNORMAL LOW (ref 34.8–46.6)
LYMPH%: 73.7 % — ABNORMAL HIGH (ref 14.0–49.7)
MCH: 38.7 pg — ABNORMAL HIGH (ref 25.1–34.0)
MCHC: 36 g/dL (ref 31.5–36.0)
MCV: 107.7 fL — ABNORMAL HIGH (ref 79.5–101.0)
MONO#: 0.1 10*3/uL (ref 0.1–0.9)
MONO%: 2.5 % (ref 0.0–14.0)
NEUT%: 22.5 % — ABNORMAL LOW (ref 38.4–76.8)
Platelets: 29 10*3/uL — ABNORMAL LOW (ref 145–400)
RBC: 2.03 10*6/uL — ABNORMAL LOW (ref 3.70–5.45)

## 2010-01-23 ENCOUNTER — Ambulatory Visit: Payer: Self-pay | Admitting: Oncology

## 2010-01-23 LAB — CBC WITH DIFFERENTIAL/PLATELET
Basophils Absolute: 0 10*3/uL (ref 0.0–0.1)
Eosinophils Absolute: 0 10*3/uL (ref 0.0–0.5)
HCT: 22.5 % — ABNORMAL LOW (ref 34.8–46.6)
HGB: 7.3 g/dL — ABNORMAL LOW (ref 11.6–15.9)
LYMPH%: 71.9 % — ABNORMAL HIGH (ref 14.0–49.7)
MONO#: 0.1 10*3/uL (ref 0.1–0.9)
NEUT#: 0.5 10*3/uL — ABNORMAL LOW (ref 1.5–6.5)
NEUT%: 23.5 % — ABNORMAL LOW (ref 38.4–76.8)
Platelets: 27 10*3/uL — ABNORMAL LOW (ref 145–400)
WBC: 2.2 10*3/uL — ABNORMAL LOW (ref 3.9–10.3)
lymph#: 1.6 10*3/uL (ref 0.9–3.3)

## 2010-01-27 ENCOUNTER — Ambulatory Visit: Payer: Self-pay | Admitting: Internal Medicine

## 2010-01-30 ENCOUNTER — Encounter: Payer: Self-pay | Admitting: Internal Medicine

## 2010-01-30 LAB — CBC WITH DIFFERENTIAL/PLATELET
Basophils Absolute: 0 10*3/uL (ref 0.0–0.1)
EOS%: 1.4 % (ref 0.0–7.0)
HGB: 9.5 g/dL — ABNORMAL LOW (ref 11.6–15.9)
MCH: 36.2 pg — ABNORMAL HIGH (ref 25.1–34.0)
MCV: 104.8 fL — ABNORMAL HIGH (ref 79.5–101.0)
MONO%: 2.6 % (ref 0.0–14.0)
NEUT%: 30.2 % — ABNORMAL LOW (ref 38.4–76.8)
RDW: 24.4 % — ABNORMAL HIGH (ref 11.2–14.5)

## 2010-02-10 ENCOUNTER — Telehealth: Payer: Self-pay | Admitting: Internal Medicine

## 2010-02-13 LAB — CBC WITH DIFFERENTIAL/PLATELET
BASO%: 0.3 % (ref 0.0–2.0)
EOS%: 1.7 % (ref 0.0–7.0)
MCH: 36 pg — ABNORMAL HIGH (ref 25.1–34.0)
MCHC: 34 g/dL (ref 31.5–36.0)
MONO%: 2.3 % (ref 0.0–14.0)
RBC: 2.57 10*6/uL — ABNORMAL LOW (ref 3.70–5.45)
RDW: 23.6 % — ABNORMAL HIGH (ref 11.2–14.5)
lymph#: 1.9 10*3/uL (ref 0.9–3.3)

## 2010-02-14 ENCOUNTER — Encounter: Payer: Self-pay | Admitting: Internal Medicine

## 2010-02-24 ENCOUNTER — Ambulatory Visit: Payer: Self-pay | Admitting: Internal Medicine

## 2010-02-25 ENCOUNTER — Ambulatory Visit: Payer: Self-pay | Admitting: Oncology

## 2010-02-26 ENCOUNTER — Encounter: Payer: Self-pay | Admitting: Internal Medicine

## 2010-02-27 ENCOUNTER — Encounter: Payer: Self-pay | Admitting: Internal Medicine

## 2010-02-27 ENCOUNTER — Encounter
Admission: RE | Admit: 2010-02-27 | Discharge: 2010-02-27 | Payer: Self-pay | Source: Home / Self Care | Attending: Oncology | Admitting: Oncology

## 2010-02-27 LAB — CBC WITH DIFFERENTIAL/PLATELET
BASO%: 0.4 % (ref 0.0–2.0)
EOS%: 0 % (ref 0.0–7.0)
MCH: 38.5 pg — ABNORMAL HIGH (ref 25.1–34.0)
MCHC: 35.5 g/dL (ref 31.5–36.0)
MCV: 108.6 fL — ABNORMAL HIGH (ref 79.5–101.0)
MONO%: 3.5 % (ref 0.0–14.0)
NEUT%: 54.7 % (ref 38.4–76.8)
RDW: 23.4 % — ABNORMAL HIGH (ref 11.2–14.5)
lymph#: 0.9 10*3/uL (ref 0.9–3.3)

## 2010-02-27 LAB — COMPREHENSIVE METABOLIC PANEL
AST: 26 U/L (ref 0–37)
Albumin: 3.9 g/dL (ref 3.5–5.2)
Alkaline Phosphatase: 83 U/L (ref 39–117)
BUN: 45 mg/dL — ABNORMAL HIGH (ref 6–23)
Potassium: 5.1 mEq/L (ref 3.5–5.3)
Sodium: 143 mEq/L (ref 135–145)
Total Bilirubin: 0.8 mg/dL (ref 0.3–1.2)

## 2010-03-10 ENCOUNTER — Telehealth: Payer: Self-pay | Admitting: Internal Medicine

## 2010-03-11 LAB — CBC WITH DIFFERENTIAL/PLATELET
BASO%: 0.4 % (ref 0.0–2.0)
Basophils Absolute: 0 10*3/uL (ref 0.0–0.1)
Eosinophils Absolute: 0 10*3/uL (ref 0.0–0.5)
HCT: 31.7 % — ABNORMAL LOW (ref 34.8–46.6)
HGB: 10.9 g/dL — ABNORMAL LOW (ref 11.6–15.9)
MONO#: 0.1 10*3/uL (ref 0.1–0.9)
NEUT#: 0.8 10*3/uL — ABNORMAL LOW (ref 1.5–6.5)
NEUT%: 34.8 % — ABNORMAL LOW (ref 38.4–76.8)
WBC: 2.4 10*3/uL — ABNORMAL LOW (ref 3.9–10.3)
lymph#: 1.4 10*3/uL (ref 0.9–3.3)

## 2010-03-13 ENCOUNTER — Encounter: Payer: Self-pay | Admitting: Internal Medicine

## 2010-03-18 ENCOUNTER — Ambulatory Visit: Payer: Self-pay | Admitting: Internal Medicine

## 2010-03-19 ENCOUNTER — Encounter: Payer: Self-pay | Admitting: Internal Medicine

## 2010-03-19 ENCOUNTER — Telehealth: Payer: Self-pay | Admitting: Internal Medicine

## 2010-03-20 ENCOUNTER — Telehealth: Payer: Self-pay | Admitting: Internal Medicine

## 2010-03-25 ENCOUNTER — Telehealth: Payer: Self-pay | Admitting: Internal Medicine

## 2010-03-26 ENCOUNTER — Telehealth: Payer: Self-pay | Admitting: Internal Medicine

## 2010-03-26 ENCOUNTER — Encounter: Payer: Self-pay | Admitting: Internal Medicine

## 2010-03-26 LAB — CBC WITH DIFFERENTIAL/PLATELET
Basophils Absolute: 0 10*3/uL (ref 0.0–0.1)
EOS%: 3.1 % (ref 0.0–7.0)
HCT: 26.9 % — ABNORMAL LOW (ref 34.8–46.6)
HGB: 9.4 g/dL — ABNORMAL LOW (ref 11.6–15.9)
MCH: 37.1 pg — ABNORMAL HIGH (ref 25.1–34.0)
MCV: 105.7 fL — ABNORMAL HIGH (ref 79.5–101.0)
NEUT%: 26.4 % — ABNORMAL LOW (ref 38.4–76.8)
Platelets: 22 10*3/uL — ABNORMAL LOW (ref 145–400)
lymph#: 1.4 10*3/uL (ref 0.9–3.3)

## 2010-03-27 ENCOUNTER — Telehealth: Payer: Self-pay | Admitting: Internal Medicine

## 2010-03-31 ENCOUNTER — Telehealth: Payer: Self-pay | Admitting: Internal Medicine

## 2010-04-02 ENCOUNTER — Telehealth: Payer: Self-pay | Admitting: Internal Medicine

## 2010-04-03 ENCOUNTER — Ambulatory Visit: Payer: Self-pay | Admitting: Gastroenterology

## 2010-04-03 LAB — CONVERTED CEMR LAB
Basophils Relative: 0.4 % (ref 0.0–3.0)
CRP, High Sensitivity: 8.35 — ABNORMAL HIGH (ref 0.00–5.00)
Eosinophils Relative: 4.5 % (ref 0.0–5.0)
HCT: 26.1 % — ABNORMAL LOW (ref 36.0–46.0)
Hemoglobin: 9.2 g/dL — ABNORMAL LOW (ref 12.0–15.0)
Lymphs Abs: 1.4 10*3/uL (ref 0.7–4.0)
Monocytes Relative: 2.7 % — ABNORMAL LOW (ref 3.0–12.0)
Neutro Abs: 0.7 10*3/uL — ABNORMAL LOW (ref 1.4–7.7)
RBC: 2.44 M/uL — ABNORMAL LOW (ref 3.87–5.11)
WBC: 2.3 10*3/uL — ABNORMAL LOW (ref 4.5–10.5)

## 2010-04-09 ENCOUNTER — Other Ambulatory Visit: Payer: Self-pay | Admitting: Oncology

## 2010-04-09 LAB — CBC WITH DIFFERENTIAL/PLATELET
Basophils Absolute: 0 10*3/uL (ref 0.0–0.1)
Eosinophils Absolute: 0 10*3/uL (ref 0.0–0.5)
HGB: 9.1 g/dL — ABNORMAL LOW (ref 11.6–15.9)
MCV: 108.4 fL — ABNORMAL HIGH (ref 79.5–101.0)
NEUT#: 0.9 10*3/uL — ABNORMAL LOW (ref 1.5–6.5)
RDW: 20.4 % — ABNORMAL HIGH (ref 11.2–14.5)
lymph#: 0.7 10*3/uL — ABNORMAL LOW (ref 0.9–3.3)

## 2010-04-17 ENCOUNTER — Ambulatory Visit: Payer: Self-pay | Admitting: Family Medicine

## 2010-04-17 LAB — CONVERTED CEMR LAB
Bilirubin Urine: NEGATIVE
Ketones, urine, test strip: NEGATIVE
pH: 6.5

## 2010-04-18 ENCOUNTER — Encounter: Payer: Self-pay | Admitting: Internal Medicine

## 2010-04-22 ENCOUNTER — Ambulatory Visit: Payer: Self-pay | Admitting: Gastroenterology

## 2010-04-22 ENCOUNTER — Telehealth: Payer: Self-pay | Admitting: Internal Medicine

## 2010-04-22 ENCOUNTER — Encounter: Payer: Self-pay | Admitting: Internal Medicine

## 2010-04-23 ENCOUNTER — Encounter: Payer: Self-pay | Admitting: Internal Medicine

## 2010-04-23 ENCOUNTER — Other Ambulatory Visit: Payer: Self-pay | Admitting: Medical

## 2010-04-23 LAB — CBC WITH DIFFERENTIAL/PLATELET
Basophils Absolute: 0 10*3/uL (ref 0.0–0.1)
Eosinophils Absolute: 0 10*3/uL (ref 0.0–0.5)
HCT: 24.2 % — ABNORMAL LOW (ref 34.8–46.6)
HGB: 8.6 g/dL — ABNORMAL LOW (ref 11.6–15.9)
LYMPH%: 29.2 % (ref 14.0–49.7)
MCV: 110.5 fL — ABNORMAL HIGH (ref 79.5–101.0)
MONO%: 1.4 % (ref 0.0–14.0)
NEUT#: 1.2 10*3/uL — ABNORMAL LOW (ref 1.5–6.5)
Platelets: 20 10*3/uL — ABNORMAL LOW (ref 145–400)

## 2010-04-28 ENCOUNTER — Encounter: Payer: Self-pay | Admitting: Internal Medicine

## 2010-04-29 ENCOUNTER — Ambulatory Visit: Payer: Self-pay | Admitting: Internal Medicine

## 2010-04-30 ENCOUNTER — Ambulatory Visit: Payer: Self-pay | Admitting: Internal Medicine

## 2010-05-01 ENCOUNTER — Encounter: Payer: Self-pay | Admitting: Internal Medicine

## 2010-05-01 ENCOUNTER — Ambulatory Visit: Payer: Self-pay | Admitting: Internal Medicine

## 2010-05-01 LAB — CONVERTED CEMR LAB
Nitrite: NEGATIVE
Specific Gravity, Urine: 1.025

## 2010-05-07 ENCOUNTER — Ambulatory Visit: Payer: Self-pay | Admitting: Oncology

## 2010-05-07 LAB — CBC WITH DIFFERENTIAL/PLATELET
Basophils Absolute: 0 10*3/uL (ref 0.0–0.1)
Eosinophils Absolute: 0 10*3/uL (ref 0.0–0.5)
HCT: 22.3 % — ABNORMAL LOW (ref 34.8–46.6)
HGB: 7.8 g/dL — ABNORMAL LOW (ref 11.6–15.9)
MONO#: 0 10*3/uL — ABNORMAL LOW (ref 0.1–0.9)
NEUT%: 15.8 % — ABNORMAL LOW (ref 38.4–76.8)
Platelets: 21 10*3/uL — ABNORMAL LOW (ref 145–400)
WBC: 3.4 10*3/uL — ABNORMAL LOW (ref 3.9–10.3)
lymph#: 2.8 10*3/uL (ref 0.9–3.3)

## 2010-05-08 ENCOUNTER — Inpatient Hospital Stay (HOSPITAL_COMMUNITY): Admission: EM | Admit: 2010-05-08 | Discharge: 2009-07-05 | Payer: Self-pay | Admitting: Emergency Medicine

## 2010-05-09 ENCOUNTER — Encounter (HOSPITAL_COMMUNITY)
Admission: RE | Admit: 2010-05-09 | Discharge: 2010-05-30 | Payer: Self-pay | Source: Home / Self Care | Attending: Medical | Admitting: Medical

## 2010-05-21 LAB — CBC WITH DIFFERENTIAL/PLATELET
Basophils Absolute: 0 10*3/uL (ref 0.0–0.1)
Eosinophils Absolute: 0 10*3/uL (ref 0.0–0.5)
HCT: 27.1 % — ABNORMAL LOW (ref 34.8–46.6)
HGB: 9.3 g/dL — ABNORMAL LOW (ref 11.6–15.9)
LYMPH%: 79.3 % — ABNORMAL HIGH (ref 14.0–49.7)
MCV: 107.3 fL — ABNORMAL HIGH (ref 79.5–101.0)
MONO%: 1.1 % (ref 0.0–14.0)
NEUT#: 0.5 10*3/uL — ABNORMAL LOW (ref 1.5–6.5)
NEUT%: 18.6 % — ABNORMAL LOW (ref 38.4–76.8)
Platelets: 15 10*3/uL — ABNORMAL LOW (ref 145–400)
RDW: 26.6 % — ABNORMAL HIGH (ref 11.2–14.5)

## 2010-05-21 LAB — URINALYSIS, MICROSCOPIC - CHCC
Bilirubin (Urine): NEGATIVE
Blood: NEGATIVE
pH: 6 (ref 4.6–8.0)

## 2010-05-23 LAB — URINE CULTURE

## 2010-05-29 ENCOUNTER — Ambulatory Visit (HOSPITAL_COMMUNITY)
Admission: RE | Admit: 2010-05-29 | Discharge: 2010-05-29 | Payer: Self-pay | Source: Home / Self Care | Attending: Oncology | Admitting: Oncology

## 2010-06-04 ENCOUNTER — Encounter: Payer: Self-pay | Admitting: Internal Medicine

## 2010-06-04 LAB — COMPREHENSIVE METABOLIC PANEL
ALT: <8 U/L (ref 0–35)
AST: 13 U/L (ref 0–37)
Albumin: 3.9 g/dL (ref 3.5–5.2)
Alkaline Phosphatase: 87 U/L (ref 39–117)
BUN: 59 mg/dL — ABNORMAL HIGH (ref 6–23)
CO2: 19 mEq/L (ref 19–32)
Calcium: 9 mg/dL (ref 8.4–10.5)
Chloride: 111 mEq/L (ref 96–112)
Creatinine, Ser: 1.79 mg/dL — ABNORMAL HIGH (ref 0.40–1.20)
Glucose, Bld: 137 mg/dL — ABNORMAL HIGH (ref 70–99)
Potassium: 5.3 mEq/L (ref 3.5–5.3)
Sodium: 139 mEq/L (ref 135–145)
Total Bilirubin: 0.4 mg/dL (ref 0.3–1.2)
Total Protein: 6.4 g/dL (ref 6.0–8.3)

## 2010-06-04 LAB — CBC WITH DIFFERENTIAL/PLATELET
BASO%: 0.2 % (ref 0.0–2.0)
Basophils Absolute: 0 10*3/uL (ref 0.0–0.1)
EOS%: 1.4 % (ref 0.0–7.0)
Eosinophils Absolute: 0 10*3/uL (ref 0.0–0.5)
HCT: 22.6 % — ABNORMAL LOW (ref 34.8–46.6)
HGB: 7.9 g/dL — ABNORMAL LOW (ref 11.6–15.9)
LYMPH%: 79.6 % — ABNORMAL HIGH (ref 14.0–49.7)
MCH: 38 pg — ABNORMAL HIGH (ref 25.1–34.0)
MCHC: 35 g/dL (ref 31.5–36.0)
MCV: 108.3 fL — ABNORMAL HIGH (ref 79.5–101.0)
MONO#: 0 10*3/uL — ABNORMAL LOW (ref 0.1–0.9)
MONO%: 1 % (ref 0.0–14.0)
NEUT#: 0.3 10*3/uL — CL (ref 1.5–6.5)
NEUT%: 17.8 % — ABNORMAL LOW (ref 38.4–76.8)
Platelets: 13 10*3/uL — ABNORMAL LOW (ref 145–400)
RBC: 2.09 10*6/uL — ABNORMAL LOW (ref 3.70–5.45)
RDW: 26 % — ABNORMAL HIGH (ref 11.2–14.5)
WBC: 1.9 10*3/uL — ABNORMAL LOW (ref 3.9–10.3)
lymph#: 1.5 10*3/uL (ref 0.9–3.3)

## 2010-06-05 ENCOUNTER — Encounter (HOSPITAL_COMMUNITY)
Admission: RE | Admit: 2010-06-05 | Discharge: 2010-07-01 | Payer: Self-pay | Source: Home / Self Care | Attending: Oncology | Admitting: Oncology

## 2010-06-05 LAB — TYPE & CROSSMATCH - CHCC

## 2010-06-06 LAB — CROSSMATCH
ABO/RH(D): O NEG
Antibody Screen: NEGATIVE
Unit division: 0
Unit division: 0

## 2010-06-10 ENCOUNTER — Other Ambulatory Visit: Payer: Self-pay | Admitting: Internal Medicine

## 2010-06-10 ENCOUNTER — Telehealth: Payer: Self-pay | Admitting: Internal Medicine

## 2010-06-10 ENCOUNTER — Ambulatory Visit
Admission: RE | Admit: 2010-06-10 | Discharge: 2010-06-10 | Payer: Self-pay | Source: Home / Self Care | Attending: Internal Medicine | Admitting: Internal Medicine

## 2010-06-10 ENCOUNTER — Encounter: Payer: Self-pay | Admitting: Internal Medicine

## 2010-06-10 DIAGNOSIS — R109 Unspecified abdominal pain: Secondary | ICD-10-CM | POA: Insufficient documentation

## 2010-06-10 DIAGNOSIS — C649 Malignant neoplasm of unspecified kidney, except renal pelvis: Secondary | ICD-10-CM | POA: Insufficient documentation

## 2010-06-10 LAB — CONVERTED CEMR LAB
Blood in Urine, dipstick: NEGATIVE
Ketones, urine, test strip: NEGATIVE
Nitrite: POSITIVE
Urobilinogen, UA: 0.2

## 2010-06-10 LAB — BASIC METABOLIC PANEL WITH GFR
BUN: 56 mg/dL — ABNORMAL HIGH (ref 6–23)
CO2: 25 meq/L (ref 19–32)
Calcium: 9.5 mg/dL (ref 8.4–10.5)
Chloride: 114 meq/L — ABNORMAL HIGH (ref 96–112)
Creatinine, Ser: 1.9 mg/dL — ABNORMAL HIGH (ref 0.4–1.2)
GFR: 26.52 mL/min — ABNORMAL LOW
Glucose, Bld: 90 mg/dL (ref 70–99)
Potassium: 5.9 meq/L — ABNORMAL HIGH (ref 3.5–5.1)
Sodium: 143 meq/L (ref 135–145)

## 2010-06-10 LAB — HEPATIC FUNCTION PANEL
ALT: 9 U/L (ref 0–35)
AST: 12 U/L (ref 0–37)
Albumin: 3.5 g/dL (ref 3.5–5.2)
Alkaline Phosphatase: 90 U/L (ref 39–117)
Bilirubin, Direct: 0.1 mg/dL (ref 0.0–0.3)
Total Bilirubin: 0.6 mg/dL (ref 0.3–1.2)
Total Protein: 6 g/dL (ref 6.0–8.3)

## 2010-06-10 LAB — AMYLASE: Amylase: 76 U/L (ref 27–131)

## 2010-06-11 ENCOUNTER — Encounter
Admission: RE | Admit: 2010-06-11 | Discharge: 2010-06-11 | Payer: Self-pay | Source: Home / Self Care | Attending: Oncology | Admitting: Oncology

## 2010-06-11 ENCOUNTER — Telehealth: Payer: Self-pay | Admitting: Internal Medicine

## 2010-06-12 LAB — CONVERTED CEMR LAB
Basophils Relative: 0 % (ref 0–1)
Eosinophils Absolute: 0 10*3/uL (ref 0.0–0.7)
Eosinophils Relative: 1 % (ref 0–5)
Hemoglobin: 10.6 g/dL — ABNORMAL LOW (ref 12.0–15.0)
MCHC: 32.6 g/dL (ref 30.0–36.0)
MCV: 106.6 fL — ABNORMAL HIGH (ref 78.0–100.0)
Monocytes Absolute: 0 10*3/uL — ABNORMAL LOW (ref 0.1–1.0)
Monocytes Relative: 2 % — ABNORMAL LOW (ref 3–12)
RBC: 3.05 M/uL — ABNORMAL LOW (ref 3.87–5.11)

## 2010-06-13 ENCOUNTER — Ambulatory Visit
Admission: RE | Admit: 2010-06-13 | Discharge: 2010-06-13 | Payer: Self-pay | Source: Home / Self Care | Attending: Internal Medicine | Admitting: Internal Medicine

## 2010-06-13 ENCOUNTER — Other Ambulatory Visit: Payer: Self-pay | Admitting: Internal Medicine

## 2010-06-13 LAB — BASIC METABOLIC PANEL
BUN: 57 mg/dL — ABNORMAL HIGH (ref 6–23)
CO2: 24 mEq/L (ref 19–32)
Calcium: 9.2 mg/dL (ref 8.4–10.5)
Chloride: 111 mEq/L (ref 96–112)
Creatinine, Ser: 2.1 mg/dL — ABNORMAL HIGH (ref 0.4–1.2)
GFR: 24.59 mL/min — ABNORMAL LOW (ref >60.00)
Glucose, Bld: 104 mg/dL — ABNORMAL HIGH (ref 70–99)
Potassium: 5.6 mEq/L — ABNORMAL HIGH (ref 3.5–5.1)
Sodium: 141 mEq/L (ref 135–145)

## 2010-06-16 ENCOUNTER — Ambulatory Visit: Payer: Self-pay | Admitting: Oncology

## 2010-06-18 LAB — CBC WITH DIFFERENTIAL/PLATELET
BASO%: 0.4 % (ref 0.0–2.0)
Basophils Absolute: 0 10*3/uL (ref 0.0–0.1)
EOS%: 1.2 % (ref 0.0–7.0)
Eosinophils Absolute: 0 10*3/uL (ref 0.0–0.5)
HCT: 28.4 % — ABNORMAL LOW (ref 34.8–46.6)
HGB: 9.8 g/dL — ABNORMAL LOW (ref 11.6–15.9)
LYMPH%: 68.9 % — ABNORMAL HIGH (ref 14.0–49.7)
MCH: 36.1 pg — ABNORMAL HIGH (ref 25.1–34.0)
MCHC: 34.6 g/dL (ref 31.5–36.0)
MCV: 104.4 fL — ABNORMAL HIGH (ref 79.5–101.0)
MONO#: 0 10*3/uL — ABNORMAL LOW (ref 0.1–0.9)
MONO%: 1.7 % (ref 0.0–14.0)
NEUT#: 0.6 10*3/uL — ABNORMAL LOW (ref 1.5–6.5)
NEUT%: 27.8 % — ABNORMAL LOW (ref 38.4–76.8)
Platelets: 15 10*3/uL — ABNORMAL LOW (ref 145–400)
RBC: 2.72 10*6/uL — ABNORMAL LOW (ref 3.70–5.45)
RDW: 24.5 % — ABNORMAL HIGH (ref 11.2–14.5)
WBC: 2.2 10*3/uL — ABNORMAL LOW (ref 3.9–10.3)
lymph#: 1.5 10*3/uL (ref 0.9–3.3)

## 2010-06-21 ENCOUNTER — Encounter: Payer: Self-pay | Admitting: Interventional Radiology

## 2010-06-22 ENCOUNTER — Encounter: Payer: Self-pay | Admitting: Interventional Radiology

## 2010-06-22 ENCOUNTER — Encounter: Payer: Self-pay | Admitting: Internal Medicine

## 2010-06-22 ENCOUNTER — Encounter: Payer: Self-pay | Admitting: Oncology

## 2010-06-27 ENCOUNTER — Ambulatory Visit (HOSPITAL_COMMUNITY)
Admission: RE | Admit: 2010-06-27 | Discharge: 2010-06-27 | Payer: Self-pay | Source: Home / Self Care | Attending: Interventional Radiology | Admitting: Interventional Radiology

## 2010-06-27 LAB — CBC
HCT: 26.7 % — ABNORMAL LOW (ref 36.0–46.0)
Hemoglobin: 9 g/dL — ABNORMAL LOW (ref 12.0–15.0)
MCHC: 33.7 g/dL (ref 30.0–36.0)
RBC: 2.52 MIL/uL — ABNORMAL LOW (ref 3.87–5.11)

## 2010-06-30 ENCOUNTER — Inpatient Hospital Stay (HOSPITAL_COMMUNITY)
Admission: RE | Admit: 2010-06-30 | Discharge: 2010-07-03 | DRG: 690 | Disposition: A | Discharge status: Home / Self Care | Payer: Medicare Other | Attending: Oncology | Admitting: Oncology

## 2010-06-30 DIAGNOSIS — K869 Disease of pancreas, unspecified: Secondary | ICD-10-CM | POA: Diagnosis present

## 2010-06-30 DIAGNOSIS — M810 Age-related osteoporosis without current pathological fracture: Secondary | ICD-10-CM | POA: Diagnosis present

## 2010-06-30 DIAGNOSIS — B9689 Other specified bacterial agents as the cause of diseases classified elsewhere: Secondary | ICD-10-CM | POA: Diagnosis present

## 2010-06-30 DIAGNOSIS — J90 Pleural effusion, not elsewhere classified: Secondary | ICD-10-CM | POA: Diagnosis present

## 2010-06-30 DIAGNOSIS — N281 Cyst of kidney, acquired: Secondary | ICD-10-CM | POA: Diagnosis present

## 2010-06-30 DIAGNOSIS — C649 Malignant neoplasm of unspecified kidney, except renal pelvis: Secondary | ICD-10-CM | POA: Diagnosis present

## 2010-06-30 DIAGNOSIS — I129 Hypertensive chronic kidney disease with stage 1 through stage 4 chronic kidney disease, or unspecified chronic kidney disease: Secondary | ICD-10-CM | POA: Diagnosis present

## 2010-06-30 DIAGNOSIS — M109 Gout, unspecified: Secondary | ICD-10-CM | POA: Diagnosis present

## 2010-06-30 DIAGNOSIS — N189 Chronic kidney disease, unspecified: Secondary | ICD-10-CM | POA: Diagnosis present

## 2010-06-30 DIAGNOSIS — D469 Myelodysplastic syndrome, unspecified: Secondary | ICD-10-CM | POA: Diagnosis present

## 2010-06-30 DIAGNOSIS — D61818 Other pancytopenia: Secondary | ICD-10-CM | POA: Diagnosis present

## 2010-06-30 DIAGNOSIS — N390 Urinary tract infection, site not specified: Principal | ICD-10-CM | POA: Diagnosis present

## 2010-06-30 LAB — URINE MICROSCOPIC-ADD ON

## 2010-06-30 LAB — CBC WITH DIFFERENTIAL/PLATELET
EOS%: 1.4 % (ref 0.0–7.0)
MCH: 36.6 pg — ABNORMAL HIGH (ref 25.1–34.0)
MCHC: 34.1 g/dL (ref 31.5–36.0)
MCV: 107.3 fL — ABNORMAL HIGH (ref 79.5–101.0)
MONO%: 1.7 % (ref 0.0–14.0)
RBC: 2.46 10*6/uL — ABNORMAL LOW (ref 3.70–5.45)
RDW: 24.6 % — ABNORMAL HIGH (ref 11.2–14.5)

## 2010-06-30 LAB — URINALYSIS, ROUTINE W REFLEX MICROSCOPIC
Bilirubin Urine: NEGATIVE
Ketones, ur: NEGATIVE mg/dL
Leukocytes, UA: NEGATIVE
Nitrite: NEGATIVE
Specific Gravity, Urine: 1.014 (ref 1.005–1.030)
Urobilinogen, UA: 0.2 mg/dL (ref 0.0–1.0)
pH: 6 (ref 5.0–8.0)

## 2010-06-30 LAB — BASIC METABOLIC PANEL
Chloride: 117 mEq/L — ABNORMAL HIGH (ref 96–112)
Creatinine, Ser: 2.12 mg/dL — ABNORMAL HIGH (ref 0.40–1.20)
Potassium: 5.4 mEq/L — ABNORMAL HIGH (ref 3.5–5.3)

## 2010-07-01 DIAGNOSIS — D61818 Other pancytopenia: Secondary | ICD-10-CM

## 2010-07-01 DIAGNOSIS — R509 Fever, unspecified: Secondary | ICD-10-CM

## 2010-07-01 LAB — DIFFERENTIAL
Basophils Relative: 0 % (ref 0–1)
Eosinophils Relative: 0 % (ref 0–5)
Monocytes Relative: 2 % — ABNORMAL LOW (ref 3–12)
Neutrophils Relative %: 68 % (ref 43–77)

## 2010-07-01 LAB — PREPARE PLATELETS

## 2010-07-01 LAB — COMPREHENSIVE METABOLIC PANEL
Albumin: 2.8 g/dL — ABNORMAL LOW (ref 3.5–5.2)
BUN: 49 mg/dL — ABNORMAL HIGH (ref 6–23)
Calcium: 9 mg/dL (ref 8.4–10.5)
Glucose, Bld: 161 mg/dL — ABNORMAL HIGH (ref 70–99)
Potassium: 5.3 mEq/L — ABNORMAL HIGH (ref 3.5–5.1)
Sodium: 143 mEq/L (ref 135–145)
Total Protein: 5.5 g/dL — ABNORMAL LOW (ref 6.0–8.3)

## 2010-07-01 LAB — CBC
HCT: 22.8 % — ABNORMAL LOW (ref 36.0–46.0)
Hemoglobin: 7.6 g/dL — ABNORMAL LOW (ref 12.0–15.0)
RBC: 2.12 MIL/uL — ABNORMAL LOW (ref 3.87–5.11)
WBC: 1.2 10*3/uL — CL (ref 4.0–10.5)

## 2010-07-02 ENCOUNTER — Inpatient Hospital Stay (HOSPITAL_COMMUNITY): Payer: Medicare Other

## 2010-07-02 LAB — PREPARE PLATELETS

## 2010-07-02 LAB — URINE CULTURE
Colony Count: >=100000
Culture  Setup Time: 201201310322

## 2010-07-02 LAB — BASIC METABOLIC PANEL
BUN: 44 mg/dL — ABNORMAL HIGH (ref 6–23)
Calcium: 9 mg/dL (ref 8.4–10.5)
GFR calc non Af Amer: 26 mL/min — ABNORMAL LOW (ref >60)
Glucose, Bld: 102 mg/dL — ABNORMAL HIGH (ref 70–99)

## 2010-07-02 LAB — CBC
HCT: 24.1 % — ABNORMAL LOW (ref 36.0–46.0)
MCHC: 33.6 g/dL (ref 30.0–36.0)
RDW: 21.9 % — ABNORMAL HIGH (ref 11.5–15.5)

## 2010-07-03 LAB — CBC
Hemoglobin: 8.5 g/dL — ABNORMAL LOW (ref 12.0–15.0)
MCHC: 34.8 g/dL (ref 30.0–36.0)
Platelets: 12 10*3/uL — CL (ref 150–400)

## 2010-07-03 NOTE — Letter (Signed)
[facility]   Imported By: Sherian Rein 09/25/2009 09:54:18  _____________________________________________________________________  External Attachment:    Type:   Image     Comment:   External Document

## 2010-07-03 NOTE — Progress Notes (Signed)
Summary: FYI  Phone Note From Other Clinic   Caller: Onalee Hua, PT Genevieve Norlander Call For: Swords Summary of Call: pt was d/c on Wednesday.  He just wanted you to know that she is doing much better.  Her indurance is better and she has better balance.  Initial call taken by: Alfred Levins, CMA,  April 22, 2010 11:09 AM

## 2010-07-03 NOTE — Assessment & Plan Note (Signed)
Summary: rov/mm   Vital Signs:  Patient profile:   77 year old female Weight:      133 pounds Temp:     98.9 degrees F oral Pulse rate:   64 / minute Pulse rhythm:   regular BP sitting:   170 / 64  (left arm) Cuff size:   regular  Vitals Entered By: Alfred Levins, CMA (April 29, 2010 11:51 AM) CC: f/u   Primary Care Provider:  Birdie Sons MD  CC:  f/u.  History of Present Illness:  Follow-Up Visit      This is an 77 year old woman who presents for Follow-up visit.  The patient denies chest pain and palpitations.  Since the last visit the patient notes no new problems or concerns.  The patient reports taking meds as prescribed.  When questioned about possible medication side effects, the patient notes none.    general fatigue but no other compliants in ROS  Current Medications (verified): 1)  Folic Acid 1 Mg Tabs (Folic Acid) .... One P.o. Daily 2)  Alprazolam 0.5 Mg  Tabs (Alprazolam) .Marland Kitchen.. 1 By Mouth Qhs 3)  Vicodin 5-500 Mg Tabs (Hydrocodone-Acetaminophen) .... Take 1/2 Two Times A Day As Needed 4)  Bisoprolol Fumarate 10 Mg Tabs (Bisoprolol Fumarate) .... Take 1 Tablet By Mouth Once A Day 5)  Lisinopril 40 Mg Tabs (Lisinopril) .... One By Mouth Daily 6)  Hydralazine Hcl 25 Mg Tabs (Hydralazine Hcl) .... Take 1 Tablet By Mouth Three Times A Day. Must Get Further Refills From Primary Care Dr! 7)  Omeprazole 20 Mg Cpdr (Omeprazole) .... One By Mouth Daily 8)  Felodipine 5 Mg Xr24h-Tab (Felodipine) .... One By Mouth Once Daily 9)  Miralax  Powd (Polyethylene Glycol 3350) .Marland Kitchen.. 1 Cap Ful At Bedtime 10)  Prednisone 10 Mg Tabs (Prednisone) .... Take 1 Tab Daily  Allergies (verified): 1)  ! Doxycycline Hyclate (Doxycycline Hyclate) 2)  ! Cardura 3)  ! Clonidine Hcl (Clonidine Hcl) 4)  Erythromycin Ethylsuccinate 5)  * Penicillins Group 6)  * Sulfa (Sulfonamides) Group 7)  Demerol (Meperidine Hcl)  Past History:  Past Medical History: Last updated:  04/03/2010 pancytopenia, anemia.  Status-post hematology workup, negative (2010)-MYELODYSPLASTIC SYNDROME LYMPHOCYTIC GASTRITIS DX 7/11 Hypertension anxiety-depression Osteoporosis--s/p forteo x 2 years , per nephrology  no need to Rx actonel Hyperlipidemia HIP FRACTURE, Bilateral (2007-2008) . s/p L hip surgery (Dr Lajoyce Corners) DJD: hip pain, s/p local injection w/  Dr Ethelene Hal, was refered to neurosurgery 10-2007 (Dr Yetta Barre) for spinal stenosis , had surgery Colonoscopy 06-2006: hyperplastic polyps h/o  CVA KIDNEY DISEASE, CHRONIC  h/o CARCINOMA, RENAL CELL--> s/p radiofrecuency ablation STRICTURE, ESOPHAGEAL REMOTE GASTRIC ULCER GLAUCOMA NOS   CONGESTIVE HEART FAILURE w/  SYSTOLIC DYSFUNCTION   RAYNAUD'S DISEASE (ICD-443.0) HYPERHOMOCYSTEINEMIA   Past Surgical History: Last updated: 12/13/2009 Hysterectomy Cataract extraction stripping of varicose veins Tubal Ligation hip surgery x 2  Spine surgery status post RFA right renal carcinoma  Family History: Last updated: 04/03/2010 [place] Family History of Breast Cancer:Sister Family History of Diabetes: Brother No FH of Colon Cancer:  Social History: Last updated: 06/12/2009 used to live w/ husband at their house until 10-08 Now live by self, lost husband in 08-07-07 1 son  Alcohol Use - no Illicit Drug Use - no still drives has a living will, states wouldn't like CPR or intubation , requested patient  provide a copy (06-15-07)  Risk Factors: Smoking Status: never (01/27/2010)  Physical Exam  General:  well-developed, elderly female in no acute  distress. She is using a cane was. HEENT exam atraumatic, normocephalic her muscles are intact. Neck is supple. Chest clear auscultation cardiac exam S1-S2 are regular. Abdomen soft bowel sounds, soft her extremities no clubbing cyanosis or edema   Impression & Recommendations:  Problem # 1:  CONGESTIVE HEART FAILURE (ICD-428.00) no sxs Her updated medication list for this problem  includes:    Bisoprolol Fumarate 10 Mg Tabs (Bisoprolol fumarate) .Marland Kitchen... Take 1 tablet by mouth once a day    Lisinopril 40 Mg Tabs (Lisinopril) ..... One by mouth daily  Problem # 2:  MYELODYSPLASTIC SYNDROME (ICD-238.75) f/u with oncology  Problem # 3:  UNSTEADY GAIT (ICD-781.2) completed [facility] encounter for [facility]   counselling and from completion  Problem # 4:  CVA (ICD-434.91) no recurrence  Problem # 5:  GERD (ICD-530.81) has f/u with dr. Juanda Chance tomorrow---endoscopy Her updated medication list for this problem includes:    Omeprazole 20 Mg Cpdr (Omeprazole) ..... One by mouth daily  Problem # 6:  HYPERTENSION (ICD-401.9) Home BPs in the 130s/70s she will continue to monitor at home (improved BPs) Her updated medication list for this problem includes:    Bisoprolol Fumarate 10 Mg Tabs (Bisoprolol fumarate) .Marland Kitchen... Take 1 tablet by mouth once a day    Lisinopril 40 Mg Tabs (Lisinopril) ..... One by mouth daily    Hydralazine Hcl 25 Mg Tabs (Hydralazine hcl) .Marland Kitchen... Take 1 tablet by mouth three times a day. must get further refills from primary care dr!    Felodipine 5 Mg Xr24h-tab (Felodipine) ..... One by mouth once daily  BP today: 170/64 Prior BP: 140/60 (04/22/2010)  Labs Reviewed: K+: 5.0 (01/02/2010) Creat: : 1.8 (12/27/2009)   Chol: 174 (04/19/2008)   HDL: 49.1 (04/19/2008)   LDL: 105 (04/19/2008)   TG: 99 (04/19/2008)  Complete Medication List: 1)  Folic Acid 1 Mg Tabs (Folic acid) .... One p.o. daily 2)  Alprazolam 0.5 Mg Tabs (Alprazolam) .Marland Kitchen.. 1 by mouth qhs 3)  Vicodin 5-500 Mg Tabs (Hydrocodone-acetaminophen) .... Take 1/2 two times a day as needed 4)  Bisoprolol Fumarate 10 Mg Tabs (Bisoprolol fumarate) .... Take 1 tablet by mouth once a day 5)  Lisinopril 40 Mg Tabs (Lisinopril) .... One by mouth daily 6)  Hydralazine Hcl 25 Mg Tabs (Hydralazine hcl) .... Take 1 tablet by mouth three times a day. must get further refills from primary care dr! 7)  Omeprazole 20  Mg Cpdr (Omeprazole) .... One by mouth daily 8)  Felodipine 5 Mg Xr24h-tab (Felodipine) .... One by mouth once daily 9)  Miralax Powd (Polyethylene glycol 3350) .Marland Kitchen.. 1 cap ful at bedtime 10)  Prednisone 10 Mg Tabs (Prednisone) .... Take 1 tab daily  Patient Instructions: 1)  see me 6 weeks   Orders Added: 1)  Est. Patient Level III [[address]]

## 2010-07-03 NOTE — Progress Notes (Signed)
Summary: hydrocodone question  Phone Note Call from Patient Call back at Home Phone [phone]   Caller: Patient via VM Call For: Birdie Sons MD Summary of Call: Refill alprazolam one every bedtime and hydrocodone  1/2 every AM for back pain to [facility]. Initial call taken by: Gladis Riffle, RN,  January 01, 2010 1:46 PM  Follow-up for Phone Call        Alprazolam was done yesterday.  Do you want to continue hydrocodone?  Was seen as new pt so no history of med to which to refer. Follow-up by: Gladis Riffle, RN,  January 01, 2010 1:47 PM  Additional Follow-up for Phone Call Additional follow up Details #1::        can probably stop hydrocodone  Pt. wants to stay on the pain meds or someting for arthritis???  She is not sure if the hydrocodone is coming in the mail in the next few days, but will call and let us know.  Will use Tylenol in the meantime if she runs out of Hydrocodone. Additional Follow-up by: Birdie Sons MD,  January 03, 2010 10:20 AM

## 2010-07-03 NOTE — Letter (Signed)
[facility]   Imported By: Lester [place] 11/11/2009 10:17:43  _____________________________________________________________________  External Attachment:    Type:   Image     Comment:   External Document

## 2010-07-03 NOTE — Progress Notes (Signed)
Summary: allergic reaction  Phone Note Call from Patient Call back at Home Phone [phone]   Caller: Patient Call For: young Reason for Call: Talk to Nurse Summary of Call: pt says she is having an allergic reaction, she said it is a stinging and burning sensation all over, itch on neck and chest and legs.  Feels like she is on fire.  Please respond.  She is also anemic.   Initial call taken by: Eugene Gavia,  June 17, 2009 11:42 AM  Follow-up for Phone Call        pt states she is burning and itching all over her body. Pt states this has been going on x 2 months. Pt states she has hives all over her body as well. Pt ha not eaten anything different. The only med change was done by CY at last OV and pt was changed from Benazepril to Benicar. Pt denies difficuly breathing or any swelling in face. Please advise. Carron Curie CMA  June 17, 2009 11:50 AM 1)  ! Doxycycline Hyclate (Doxycycline Hyclate) 2)  ! Cardura 3)  Erythromycin Ethylsuccinate 4)  * Penicillins Group 5)  * Sulfa (Sulfonamides) Group 6)  Demerol (Meperidine Hcl)   Additional Follow-up for Phone Call Additional follow up Details #1::        She is still uncomfortable with what looks like a drug rash. If possible, would like Dr Drue Novel to consider taking her off/ substtuting for lasix and amlodipine for a 2 week trial.  Additional Follow-up by: Waymon Budge MD,  June 20, 2009 5:36 PM    Additional Follow-up for Phone Call Additional follow up Details #2::    Stacia: will start by switch Lasix to edecrin 25mg  two tablets in the morning and two tablets at 2 p.m. nurse visit for BP check and BMP in two weeks Naithan Delage E. Marshayla Mitschke MD  June 21, 2009 9:41 AM   discussed with pt -- rx called Follow-up by: Shary Decamp,  June 21, 2009 5:15 PM  New/Updated Medications: EDECRIN 25 MG TABS (ETHACRYNIC ACID) 2 by mouth qam, 2 by mouth @ 2pm, needs blood pressure checked in2 weeks Prescriptions: EDECRIN 25 MG  TABS (ETHACRYNIC ACID) 2 by mouth qam, 2 by mouth @ 2pm, needs blood pressure checked in2 weeks  #120 x 0   Entered by:   Shary Decamp   Authorized by:   Nolon Rod. Neco Kling MD   Signed by:   Shary Decamp on 06/21/2009   Method used:   Electronically to        [facility]  [phone]* (retail)       [address]       [place], [place]  [address]       Ph: [phone] or [phone]       Fax: [phone]   RxID:   [id number]

## 2010-07-03 NOTE — Progress Notes (Signed)
Summary: [facility] refills  Phone Note Refill Request   Refills Requested: Medication #1:  ALPRAZOLAM 0.5 MG  TABS 1 by mouth qhs  Medication #2:  FOLIC ACID 1 MG TABS one p.o. daily  Medication #3:  VICODIN 5-500 MG TABS Take 1/2 two times a day as needed  Medication #4:  BISOPROLOL FUMARATE 10 MG TABS Take 1 tablet by mouth once a day pt need rx fax to [facility] [phone] also lisinopril 40mg  and hydralazine 25 mg #90 with 3 refills  Initial call taken by: Heron Sabins,  March 10, 2010 10:55 AM  Follow-up for Phone Call        Pt is scheduled for OV 10/18    Prescriptions: BISOPROLOL FUMARATE 10 MG TABS (BISOPROLOL FUMARATE) Take 1 tablet by mouth once a day  #30 x 11   Entered by:   Willy Eddy, LPN   Authorized by:   Birdie Sons MD   Signed by:   Willy Eddy, LPN on 47/82/9562   Method used:   Printed then faxed to ...       [facility]  [address]  [phone]* (retail)       [address]       [place], [place]  [address]       Ph: [phone] or [phone]       Fax: [phone]   RxID:   [id number] LISINOPRIL 40 MG TABS (LISINOPRIL) one by mouth daily  #90 x 3   Entered by:   Willy Eddy, LPN   Authorized by:   Birdie Sons MD   Signed by:   Willy Eddy, LPN on 51/88/4166   Method used:   Printed then faxed to ...       [facility]  [address]  [phone]* (retail)       [address]       [place], [place]  [address]       Ph: [phone] or [phone]       Fax: [phone]   RxID:   [id number] ALPRAZOLAM 0.5 MG  TABS (ALPRAZOLAM) 1 by mouth qhs  #90 x 2   Entered by:   Willy Eddy, LPN   Authorized by:   Birdie Sons MD   Signed by:   Willy Eddy, LPN on 62/70/3500   Method used:   Printed then faxed to ...       [facility]  [address]  [phone]* (retail)       [address]       [place], [place]  [address]       Ph: [phone]  or [phone]       Fax: [phone]   RxID:   [id number] FOLIC ACID 1 MG TABS (FOLIC ACID) one p.o. daily  #90 x 3   Entered by:   Willy Eddy, LPN   Authorized by:   Birdie Sons MD   Signed by:   Willy Eddy, LPN on 95/01/3266   Method used:   Printed then faxed to ...       [facility]  [address]  [phone]* (retail)       [address]       [place], [place]  [address]       Ph: [phone] or [phone]  Fax: [phone]   RxID:   [id number]

## 2010-07-03 NOTE — Progress Notes (Signed)
Summary: TRIAGE   Phone Note Call from Patient Call back at Home Phone [phone]   Caller: Patient Call For: Dr. Juanda Chance Reason for Call: Talk to Nurse Summary of Call: not feeling well since EGD Initial call taken by: Vallarie Mare,  December 09, 2009 1:54 PM  Follow-up for Phone Call        Had Endo. and GES on 10-25-09, Flex. in the hospital, 11-28-09, last OV 12-03-09.  Pt. c/o continued fatigue, states she feels worse than when she was in the hospital. C/o leg weakness, dizziness  & back pain. Denies n/v, no abd.pain, constipation, diarrhea, blood, black stools, fever.   DR.Rhyen Mazariego PLEASE ADVISE  Follow-up by: Laureen Ochs LPN,  December 09, 2009 2:52 PM  Additional Follow-up for Phone Call Additional follow up Details #1::        The reason for her not feeling well is her multiple medical problems, mainly her lymphoproliferative disease. She needs to follow up with her oncologist or her PCP. Additional Follow-up by: Hart Carwin MD,  December 09, 2009 3:07 PM    Additional Follow-up for Phone Call Additional follow up Details #2::    Above MD orders reviewed with patient. Pt. instructed to call back as needed.  Follow-up by: Laureen Ochs LPN,  December 09, 2009 3:15 PM

## 2010-07-03 NOTE — Letter (Signed)
Summary: [facility]  [facility]   Imported By: Maryln Gottron 03/17/2010 09:52:29  _____________________________________________________________________  External Attachment:    Type:   Image     Comment:   External Document

## 2010-07-03 NOTE — Letter (Signed)
Summary: EGD Instructions  [facility]  [address] [place], [place] [address]   Phone: [phone]  Fax: [phone]       [name]    1926/05/05    MRN: [id number]       Procedure Day /Date: 04-30-2010     Arrival Time: 9:00 AM      Procedure Time: 10:00 AM     Location of Procedure:                    X      [facility] (4th Floor)   PREPARATION FOR ENDOSCOPY   On11-30-2011 THE DAY OF THE PROCEDURE:  1.   No solid foods, milk or milk products are allowed after midnight the night before your procedure.  2.   Do not drink anything colored red or purple.  Avoid juices with pulp.  No orange juice.  3.  You may drink clear liquids until 8:00 AM , which is 2 hours before your procedure.                                                                                                CLEAR LIQUIDS INCLUDE: Water Jello Ice Popsicles Tea (sugar ok, no milk/cream) Powdered fruit flavored drinks Coffee (sugar ok, no milk/cream) Gatorade Juice: apple, white grape, white cranberry  Lemonade Clear bullion, consomm, broth Carbonated beverages (any kind) Strained chicken noodle soup Hard Candy   MEDICATION INSTRUCTIONS  Unless otherwise instructed, you should take regular prescription medications with a small sip of water as early as possible the morning of your procedure.        OTHER INSTRUCTIONS  You will need a responsible adult at least 75 years of age to accompany you and drive you home.   This person must remain in the waiting room during your procedure.  Wear loose fitting clothing that is easily removed.  Leave jewelry and other valuables at home.  However, you may wish to bring a book to read or an iPod/MP3 player to listen to music as you wait for your procedure to start.  Remove all body piercing jewelry and leave at home.  Total time from sign-in until discharge is approximately 2-3 hours.  You should go home directly after  your procedure and rest.  You can resume normal activities the day after your procedure.  The day of your procedure you should not:   Drive   Make legal decisions   Operate machinery   Drink alcohol   Return to work  You will receive specific instructions about eating, activities and medications before you leave.    The above instructions have been reviewed and explained to me by   _______________________    I fully understand and can verbalize these instructions _____________________________ Date _________

## 2010-07-03 NOTE — Letter (Signed)
Summary: [facility]counter Notice/[facility]  Encounter Notice/[facility]   Imported By: Lanelle Bal 07/11/2009 15:50:43  _____________________________________________________________________  External Attachment:    Type:   Image     Comment:   External Document

## 2010-07-03 NOTE — Assessment & Plan Note (Signed)
Summary: 1 month follow up/cjr   Vital Signs:  Patient profile:   77 year old female Weight:      137 pounds BMI:     23.60 Temp:     98.3 degrees F oral Pulse rate:   60 / minute Pulse rhythm:   regular Resp:     12 per minute BP sitting:   190 / 80  (left arm) Cuff size:   regular  Vitals Entered By: Gladis Riffle, RN (January 27, 2010 11:16 AM) CC: 1 month rov--c/o unsteadiness with walker, uses cane Is Patient Diabetic? No   Primary Care Provider:  Birdie Sons MD  CC:  1 month rov--c/o unsteadiness with walker and uses cane.  History of Present Illness:  Follow-Up Visit      This is an 77 year old woman who presents for Follow-up visit.  The patient denies chest pain and palpitations.  Since the last visit the patient notes no new problems or concerns.  The patient reports taking meds as prescribed.  When questioned about possible medication side effects, the patient notes none.   She monitors BP at home sometimes BP elevated to 190/70---states she is sompliant with meds no complications of htn known.  pain---she uses vicodin. reviewed pharmacy note---multiple prescriptions  All other systems reviewed and were negative a  Preventive Screening-Counseling & Management  Alcohol-Tobacco     Smoking Status: never  Current Problems (verified): 1)  Fatigue  (ICD-780.79) 2)  Personal Hx Colonic Polyps  (ICD-V12.72) 3)  Myelodysplastic Syndrome  (ICD-238.75) 4)  Colitis, Hx of  (ICD-V12.79) 5)  Hiatal Hernia  (ICD-553.3) 6)  Diverticulosis of Colon  (ICD-562.10) 7)  Gerd  (ICD-530.81) 8)  Gout, Unspecified  (ICD-274.9) 9)  Anxiety Depression  (ICD-300.4) 10)  Hip Fracture, Left  (ICD-820.8) 11)  Colonoscopy, Hx of  (ICD-V12.79) 12)  Cva  (ICD-434.91) 13)  Kidney Disease, Chronic Nos  (ICD-585.9) 14)  Carcinoma, Renal Cell  (ICD-233.9) 15)  Glaucoma Nos  (ICD-365.9) 16)  Congestive Heart Failure, Systolic Dysfunction  (ICD-428.20) 17)  Raynaud's Disease   (ICD-443.0) 18)  Hyperlipidemia  (ICD-272.4) 19)  Osteoporosis  (ICD-733.00) 20)  Hypertension  (ICD-401.9)  Current Medications (verified): 1)  Folic Acid 1 Mg Tabs (Folic Acid) .... One P.o. Daily 2)  Alprazolam 0.5 Mg  Tabs (Alprazolam) .Marland Kitchen.. 1 By Mouth Qhs 3)  Vicodin 5-500 Mg Tabs (Hydrocodone-Acetaminophen) .... Take 1/2 Two Times A Day As Needed 4)  Bisoprolol Fumarate 5 Mg Tabs (Bisoprolol Fumarate) .... Take 1 Tablet By Mouth Once Daily. 5)  Lisinopril 10 Mg Tabs (Lisinopril) .... Take 1 Tablet By Mouth Once A Day. 6)  Hydralazine Hcl 25 Mg Tabs (Hydralazine Hcl) .... Take 1 Tablet By Mouth Three Times A Day. Must Get Further Refills From Primary Care Dr!  Allergies: 1)  ! Doxycycline Hyclate (Doxycycline Hyclate) 2)  ! Cardura 3)  Erythromycin Ethylsuccinate 4)  * Penicillins Group 5)  * Sulfa (Sulfonamides) Group 6)  Demerol (Meperidine Hcl)  Physical Exam  General:  alert and well-developed.  she walks with a cane. She appears in no distress. HEENT exam atraumatic, normocephalic. Neck is supple without lymphadenopathy. Chest is clear to auscultation without increased work of breathing. Cardiac exam S1-S2 are normal without murmurs or gallops. Abdominal exam bowel sounds, soft extremities without cyanosis or edema. Neurologic exam she is alert. She carries on a normal conversation. Sensory functions are grossly intact. She does walk with a cane.   Impression & Recommendations:  Problem # 1:  MYELODYSPLASTIC SYNDROME (ICD-238.75) followed by oncology received 2 u prbcs last week  Problem # 2:  HIP FRACTURE, LEFT (ICD-820.8) she reports chronic pain will get pain meds from Dr. Mikel Cella. al.  Problem # 3:  KIDNEY DISEASE, CHRONIC NOS (ICD-585.9) will continue to monitor Labs Reviewed: BUN: 42 (12/27/2009)   Cr: 1.8 (12/27/2009)    Hgb: 9.2 (12/27/2009)   Hct: 26.7 (12/27/2009)   Ca++: 9.1 (12/27/2009)    TP: 5.9 (12/27/2009)   Alb: 3.6 (12/27/2009)  Problem # 4:   GLAUCOMA NOS (ICD-365.9) has f/u with ophthalmology  Problem # 5:  CARCINOMA, RENAL CELL (ICD-233.9)  Problem # 6:  HYPERTENSION (ICD-401.9) not controlled. Needs more aggressive treatment. Will titrate medication slowly due to her age. Increase lisinopril to 20 mg p.o. q. day. She understands this. Prescription was called in. She understands that she could take 210 mg tablets and set up a new prescription. I'll see her back in one month. Her updated medication list for this problem includes:    Bisoprolol Fumarate 5 Mg Tabs (Bisoprolol fumarate) .Marland Kitchen... Take 1 tablet by mouth once daily.    Lisinopril 20 Mg Tabs (Lisinopril) .Marland Kitchen... 1 tablet by mouth daily    Hydralazine Hcl 25 Mg Tabs (Hydralazine hcl) .Marland Kitchen... Take 1 tablet by mouth three times a day. must get further refills from primary care dr!  BP today: 190/80 Prior BP: 124/58 (12/27/2009)  Labs Reviewed: K+: 5.0 (01/02/2010) Creat: : 1.8 (12/27/2009)   Chol: 174 (04/19/2008)   HDL: 49.1 (04/19/2008)   LDL: 105 (04/19/2008)   TG: 99 (04/19/2008)  Complete Medication List: 1)  Folic Acid 1 Mg Tabs (Folic acid) .... One p.o. daily 2)  Alprazolam 0.5 Mg Tabs (Alprazolam) .Marland Kitchen.. 1 by mouth qhs 3)  Vicodin 5-500 Mg Tabs (Hydrocodone-acetaminophen) .... Take 1/2 two times a day as needed 4)  Bisoprolol Fumarate 5 Mg Tabs (Bisoprolol fumarate) .... Take 1 tablet by mouth once daily. 5)  Lisinopril 20 Mg Tabs (Lisinopril) .Marland Kitchen.. 1 tablet by mouth daily 6)  Hydralazine Hcl 25 Mg Tabs (Hydralazine hcl) .... Take 1 tablet by mouth three times a day. must get further refills from primary care dr!  Patient Instructions: 1)  Please schedule a follow-up appointment in 1 month. Prescriptions: LISINOPRIL 20 MG TABS (LISINOPRIL) 1 tablet by mouth daily  #90 x 3   Entered and Authorized by:   Birdie Sons MD   Signed by:   Birdie Sons MD on 01/27/2010   Method used:   Electronically to        [facility]  [phone]* (retail)       [address]       [place], [place]  [address]       Ph: [phone] or [phone]       Fax: [phone]   RxID:   [id number]

## 2010-07-03 NOTE — Progress Notes (Signed)
Summary: FYI  checked on pt  Phone Note Call from Patient Call back at Home Phone [phone]   Summary of Call: pt called says her speech is slurred this am, had  stroke in the past.  Has a friend  taking her to [facility]  hospital Initial call taken by: Kandice Hams,  August 09, 2009 9:19 AM  Follow-up for Phone Call        was seen in the ER with slurred speech, CT head  was non-acute. She was also diagnosed with a UTI, urine culture showed  Klebsiella sensitive to multiple antibiotics. she was given at least one Cipro on the E. R. Please check on the patient what she prescribed antibiotics after she was discharged from the E. R.? Khushbu Pippen E. Teale Goodgame MD  August 13, 2009 2:32 PM   Spoke with pt who was given Cipro 250mg  two times a day for 7 days she is still on this med. -Recommend pt should have urine rechecked after meds, pt says she will call,seeing so many doctors right now .Kandice Hams  August 14, 2009 9:10 AM   Follow-up by: Kandice Hams,  August 14, 2009 9:08 AM

## 2010-07-03 NOTE — Letter (Signed)
[facility]   Imported By: Maryln Gottron 01/16/2010 12:42:35  _____________________________________________________________________  External Attachment:    Type:   Image     Comment:   External Document

## 2010-07-03 NOTE — Letter (Signed)
[facility]   Imported By: Lanelle Bal 08/30/2009 10:31:12  _____________________________________________________________________  External Attachment:    Type:   Image     Comment:   External Document

## 2010-07-03 NOTE — Progress Notes (Signed)
Summary: REQUEST FOR RX  Phone Note From Other Clinic   Caller: Provider Enrigue Catena) w/ [facility] Summary of Call: Adv that pts bp today was  180 / 80 ....Marland KitchenMarland KitchenMarland Kitchen Enrigue Catena w/ [facility] req that Rx for med: Clonidine 0.1 mg  be sent to [facility] .Marland Kitchen... Onalee Hua can be reached at [phone] with any questions or concerns.   Initial call taken by: Debbra Riding,  March 26, 2010 3:35 PM  Follow-up for Phone Call        see previous phone note Follow-up by: Birdie Sons MD,  March 26, 2010 4:20 PM  Additional Follow-up for Phone Call Additional follow up Details #1::        Spoke with pt and adv her that Rx was sent to [facility].... Pt acknowledged same.  Additional Follow-up by: Debbra Riding,  March 26, 2010 4:39 PM

## 2010-07-03 NOTE — Progress Notes (Signed)
Summary: Would like to Switch PCP  Phone Note Refill Request Call back at Home Phone [phone]   Caller: Patient Summary of Call: Patient would like to switch PCP from Dr. Drue Novel to a physician at [facility], prefers a female.  Lives closer to [place].  Husband saw Dr. [name] before he died.  Please advise if ok to switch. Initial call taken by: Trixie Dredge,  Oct 21, 2009 3:48 PM  Follow-up for Phone Call        okay with me Nolon Rod. Paz MD  Oct 21, 2009 4:56 PM   lmom  Follow-up by: Heron Sabins,  November 04, 2009 11:02 AM  Additional Follow-up for Phone Call Additional follow up Details #1::        Per Dr. Fabian Sharp not currently accepting new medicare patients. Additional Follow-up by: Trixie Dredge,  October 31, 2009 11:56 AM    Additional Follow-up for Phone Call Additional follow up Details #2::    lmom lmom 11-06-2009 2pm also lmom 11-13-09 @ 9:19am lmom 11-19-2009 @8 .32am Follow-up by: Heron Sabins,  October 31, 2009 12:55 PM  Additional Follow-up for Phone Call Additional follow up Details #3:: Details for Additional Follow-up Action Taken: left several messages pt did not return call Additional Follow-up by: Heron Sabins,  November 20, 2009 8:03 AM

## 2010-07-03 NOTE — Assessment & Plan Note (Signed)
Summary: allergy consult/hives/kcw   Vital Signs:  Patient profile:   77 year old female Height:      63 inches Weight:      145.50 pounds BMI:     25.87 O2 Sat:      98 % on Room air Pulse rate:   54 / minute BP sitting:   160 / 68  (right arm) Cuff size:   regular  Vitals Entered By: Reynaldo Minium CMA (June 12, 2009 11:48 AM)  O2 Flow:  Room air  Primary Provider/Referring Provider:  Willow Ora MD   History of Present Illness: June 12, 2009- 83 yoF seen on kind referral from Dr Drue Novel for allergy evaluation concerned with hives. I had seen her with her husband in past, but he died in a fall at home. She has hx of renal isufficiency and had hematology consult for pancytopenia with transfusion, Hives began 2 months ago. She was getting Procrit for anemia by Dr Juliette Alcide. When given Aranesp instead of Procrit, hives would flare. Hives may also have flared with Cardura. She describes stinging red blotches, raised and shifting over most of body. Sent to dermatologist who gave creams. She has also tried Permethrin, mupiricin, triamcinolone, lubriderm, Sarna. Is now doing oatmeal baths and a prednisone taper, with hydroxyzine.   Current Medications (verified): 1)  Citalopram Hydrobromide 10 Mg Tabs (Citalopram Hydrobromide) .Marland Kitchen.. 1 By Mouth Once Daily 2)  Folic Acid 1 Mg Tabs (Folic Acid) .... One P.o. Daily 3)  Omeprazole 20 Mg  Cpdr (Omeprazole) .Marland Kitchen.. 1 By Mouth Qd 4)  Lasix 20 Mg  Tabs (Furosemide) .... 2 Every Am, 2 @ 2pm 5)  Alprazolam 0.5 Mg  Tabs (Alprazolam) .Marland Kitchen.. 1 By Mouth Qhs 6)  Metoprolol Tartrate 25 Mg  Tabs (Metoprolol Tartrate) .... 2 By Mouth Two Times A Day 7)  Kionex  Powd (Sodium Polystyrene Sulfonate) .... Dissolve in Water As Directed 8)  Amlodipine Besylate 10 Mg Tabs (Amlodipine Besylate) .Marland Kitchen.. 1 By Mouth Daily 9)  Benazepril Hcl 20 Mg Tabs (Benazepril Hcl) .Marland Kitchen.. 1 By Mouth Once Daily 10)  Zantac 75 75 Mg Tabs (Ranitidine Hcl) .... One A Day 11)  Doxepin Hcl 25 Mg  Caps (Doxepin Hcl) .Marland Kitchen.. 1 By Mouth Qhs 12)  Zyrtec Hives Relief 10 Mg Tabs (Cetirizine Hcl) .Marland Kitchen.. 1 By Mouth Once Daily As Needed 13)  Hydroxyzine Hcl 25 Mg Tabs (Hydroxyzine Hcl) .Marland Kitchen.. 1-2 By Mouth At Bedtime 14)  Prednisone 5 Mg Tabs (Prednisone) .... 4 By Mouth Once Daily X 3, 3x3,2x3,1x3 15)  Florastor 250 Mg Caps (Saccharomyces Boulardii) .... Take 1 By Mouth Once Daily 16)  Allopurinol 100 Mg Tabs (Allopurinol) .... Take 1 By Mouth Two Times A Day 17)  Colchicine 0.6 Mg Tabs (Colchicine) .... Take 1 By Mouth Two Times A Day As Needed Gout  Allergies (verified): 1)  ! Doxycycline Hyclate (Doxycycline Hyclate) 2)  ! Cardura 3)  Erythromycin Ethylsuccinate 4)  * Penicillins Group 5)  * Sulfa (Sulfonamides) Group 6)  Demerol (Meperidine Hcl)  Past History:  Past Medical History: Last updated: 04/08/2009 pancytopenia, anemia.  Status-post hematology workup, negative (2010) Hypertension anxiety-depression Osteoporosis--s/p forteo x 2 years , per nephrology  no need to Rx actonel Hyperlipidemia HIP FRACTURE, Bilateral (2007-2008) . s/p L hip surgery (Dr Lajoyce Corners) DJD: hip pain, s/p local injection w/  Dr Ethelene Hal, was refered to neurosurgery 10-2007 (Dr Yetta Barre) for spinal stenosis , had surgery Colonoscopy 06-2006: hyperplastic polyps h/o  CVA KIDNEY DISEASE, CHRONIC  h/o CARCINOMA, RENAL  CELL--> s/p radiofrecuency ablation STRICTURE, ESOPHAGEAL GLAUCOMA NOS   CONGESTIVE HEART FAILURE w/  SYSTOLIC DYSFUNCTION   RAYNAUD'S DISEASE (ICD-443.0) HYPERHOMOCYSTEINEMIA   Family History: [place] updated[place] 09/28/2008 [place]  Social History: Last updated: 06/12/2009 used to live w/ husband at their house until 10-08 Now live by self, lost husband in 08-07-07 1 son  Alcohol Use - no Illicit Drug Use - no still drives has a living will, states wouldn't like CPR or intubation , requested patient  provide a copy (06-15-07)  Risk Factors: Smoking Status: never (11/09/2006)  Past Surgical  History: Hysterectomy Cataract extraction stripping of varicose veins Tubal Ligation hip surgery x 2  Spine surgery  Social History: used to live w/ husband at their house until 10-08 Now live by self, lost husband in 08-07-07 1 son  Alcohol Use - no Illicit Drug Use - no still drives has a living will, states wouldn't like CPR or intubation , requested patient  provide a copy (06-15-07)  Review of Systems      See HPI       The patient complains of shortness of breath with activity, itching, and rash.  The patient denies shortness of breath at rest, productive cough, non-productive cough, coughing up blood, chest pain, irregular heartbeats, acid heartburn, indigestion, loss of appetite, weight change, abdominal pain, difficulty swallowing, sore throat, tooth/dental problems, headaches, nasal congestion/difficulty breathing through nose, sneezing, ear ache, anxiety, depression, hand/feet swelling, joint stiffness or pain, change in color of mucus, and fever.    Physical Exam  Additional Exam:  General: A/Ox3; pleasant and cooperative, NAD, alert, weak, elderly SKIN: Dry scaling, with fine macular erythematous rash diffusely on arms and trunk, less on back, not excoriated NODES: no lymphadenopathy HEENT: [place]/AT, EOM- WNL, Conjuctivae- clear, PERRLA, TM-WNL, Nose- clear, Throat- clear and wnl NECK: Supple w/ fair ROM, JVD- none, normal carotid impulses w/o bruits Thyroid- CHEST: Clear to P&A HEART: RRR, no m/g/r heard ABDOMEN: Soft and nl;  ZOX:WRUE, nl pulses, no edema  NEURO: Grossly intact to observation      Impression & Recommendations:  Problem # 1:  RASH-NONVESICULAR (ICD-782.1)  She refers to this as "hives", though I don't see obvious urticarial lesions. It may be related to one of her meds or to her pancytopenia.. An obvious association maight be with her benazepril and I will have her substitute an ARB for a while to assess that. There is potential for reaction to any  of her meds and uncertain relationship to the erythrocyte stimulating meds she has been taking. Less likely, she may have developed another systemic illness such as a vasculitis. She continues to see Dr Juliette Alcide at [facility]. I will give her samples we have here of Benicar 40 mg to try instread of Benazepril. I cautioned her to be careful about porential change in BP and orthostasis. It may take a couple of weeks for this drug change to show benefit. We will draw labs for allergic and vasculitic markers.  Medications Added to Medication List This Visit: 1)  Florastor 250 Mg Caps (Saccharomyces boulardii) .... Take 1 by mouth once daily 2)  Allopurinol 100 Mg Tabs (Allopurinol) .... Take 1 by mouth two times a day 3)  Colchicine 0.6 Mg Tabs (Colchicine) .... Take 1 by mouth two times a day as needed gout  Other Orders: Consultation Level IV (45409) T-Antinuclear Antib (ANA) [phone]) T-IgE (Immunoglobulin E) ([id number]) TLB-CBC Platelet - w/Differential (85025-CBCD) TLB-Sedimentation Rate (ESR) (85652-ESR) TLB-Rheumatoid Factor (RA) (84696-EX)  Patient Instructions: 1)  Please schedule a follow-up appointment in 3 weeks. 2)  Lab 3)  Try replacing benazepril for awhile with the samples of Benicar 40, one daily. Watch out for change in blood pressure so you don't get too light headed and unsteady. It may take 2-3 weeks to see improvement IF benazepril was causing the rash.

## 2010-07-03 NOTE — Progress Notes (Signed)
Summary: Triage   Phone Note Call from Patient Call back at Home Phone [phone]   Caller: Patient Call For: Dr. Juanda Chance Reason for Call: Talk to Nurse Summary of Call: Having abd pain "I am afraid to eat" Initial call taken by: Karna Christmas,  April 02, 2010 9:40 AM  Follow-up for Phone Call        Patient called to report nausea since yesterday.  States "I am afraid to eat something because of the nausea." Denies vomiting, diarrhea, constipation or fever. Has not taken anything for nausea. States she does not have any medications for nausea to take. GES and EGD on 10/25/09 and flex sig on 11/01/09. Hx of lymphocytic gastritis, mod. diverticulosis. Patient states she cannot come in today because she has therapy this afternoon Scheduled on 04/03/10 @ 10:30 with Mike Gip, PA. Patient also states she did find some medication for nausea that she had forgotten. Follow-up by: Jesse Fall RN,  April 02, 2010 10:02 AM

## 2010-07-03 NOTE — Progress Notes (Signed)
Summary: gout  Phone Note Call from Patient   Summary of Call: Pt was in the hospital in 10/2009, and was treated with Prednisone for gout.  Is having a flare, and does not have any RX to take. [phone] Initial call taken by: Lynann Beaver CMA,  December 11, 2009 9:41 AM  Follow-up for Phone Call        prednisone 20 mg by mouth once daily for 5 days Follow-up by: Birdie Sons MD,  December 11, 2009 2:51 PM  Additional Follow-up for Phone Call Additional follow up Details #1::        Phone Call Completed Additional Follow-up by: Rudy Jew, RN,  December 11, 2009 4:20 PM    New/Updated Medications: PREDNISONE 20 MG TABS (PREDNISONE) Once daily for 5 days Prescriptions: PREDNISONE 20 MG TABS (PREDNISONE) Once daily for 5 days  #5 x 0   Entered by:   Rudy Jew, RN   Authorized by:   Birdie Sons MD   Signed by:   Rudy Jew, RN on 12/11/2009   Method used:   Electronically to        [facility]  [id number]* (retail)       [address]       [place], [place]  [address]       Ph: [phone] or [phone]       Fax: [phone]   RxID:   [id number]

## 2010-07-03 NOTE — Assessment & Plan Note (Signed)
Summary: hives,swh   Vital Signs:  Patient profile:   77 year old female Height:      63 inches Weight:      142.6 pounds O2 Sat:      91 % on Room air Pulse rate:   69 / minute BP sitting:   140 / 72  Vitals Entered By: Shary Decamp (June 04, 2009 11:29 AM)  O2 Flow:  Room air CC: hives all over, itchy & burning   History of Present Illness: continue with a rash described by the patient as "hives" (+) pruritus derm Rx something for scabies? ("cream to use from the neck down") but she couldn't finish the cream b/c it  caused burning   Current Medications (verified): 1)  Citalopram Hydrobromide 10 Mg Tabs (Citalopram Hydrobromide) .Marland Kitchen.. 1 By Mouth Once Daily 2)  Folic Acid 1 Mg Tabs (Folic Acid) .... One P.o. Daily 3)  Omeprazole 20 Mg  Cpdr (Omeprazole) .Marland Kitchen.. 1 By Mouth Qd 4)  Lasix 20 Mg  Tabs (Furosemide) .... 2 Every Am, 2 @ 2pm 5)  Alprazolam 0.5 Mg  Tabs (Alprazolam) .Marland Kitchen.. 1 By Mouth Qhs 6)  Metoprolol Tartrate 25 Mg  Tabs (Metoprolol Tartrate) .... 2 By Mouth Two Times A Day 7)  Vicodin 5-500 Mg  Tabs (Hydrocodone-Acetaminophen) .... 1.5 By Mouth Three Times A Day 8)  Kionex  Powd (Sodium Polystyrene Sulfonate) .... Dissolve in Water As Directed 9)  Amlodipine Besylate 10 Mg Tabs (Amlodipine Besylate) .Marland Kitchen.. 1 By Mouth Daily 10)  Benazepril Hcl 20 Mg Tabs (Benazepril Hcl) .Marland Kitchen.. 1 By Mouth Once Daily 11)  Zantac 75 75 Mg Tabs (Ranitidine Hcl) .... One A Day 12)  Doxepin Hcl 25 Mg Caps (Doxepin Hcl) .Marland Kitchen.. 1 By Mouth Qhs 13)  Zyrtec Hives Relief 10 Mg Tabs (Cetirizine Hcl) .Marland Kitchen.. 1 By Mouth Once Daily As Needed 14)  Hydroxyzine Hcl 25 Mg Tabs (Hydroxyzine Hcl) .Marland Kitchen.. 1-2 By Mouth At Bedtime  Allergies (verified): 1)  ! Doxycycline Hyclate (Doxycycline Hyclate) 2)  ! Cardura 3)  Erythromycin Ethylsuccinate 4)  * Penicillins Group 5)  * Sulfa (Sulfonamides) Group 6)  Demerol (Meperidine Hcl)  Past History:  Past Medical History: Reviewed history from 04/08/2009 and no  changes required. pancytopenia, anemia.  Status-post hematology workup, negative (2010) Hypertension anxiety-depression Osteoporosis--s/p forteo x 2 years , per nephrology  no need to Rx actonel Hyperlipidemia HIP FRACTURE, Bilateral (2007-2008) . s/p L hip surgery (Dr Lajoyce Corners) DJD: hip pain, s/p local injection w/  Dr Ethelene Hal, was refered to neurosurgery 10-2007 (Dr Yetta Barre) for spinal stenosis , had surgery Colonoscopy 06-2006: hyperplastic polyps h/o  CVA KIDNEY DISEASE, CHRONIC  h/o CARCINOMA, RENAL CELL--> s/p radiofrecuency ablation STRICTURE, ESOPHAGEAL GLAUCOMA NOS   CONGESTIVE HEART FAILURE w/  SYSTOLIC DYSFUNCTION   RAYNAUD'S DISEASE (ICD-443.0) HYPERHOMOCYSTEINEMIA   Past Surgical History: Reviewed history from 06/14/2008 and no changes required. Hysterectomy Cataract extraction stripping of varicose veins Tubal Ligation hip surgery x 2   Social History: Reviewed history from 06/14/2008 and no changes required. used to live w/ husband at their house until 10-08 Now live by self, lost husband in 08-07-07 1 son  Alcohol Use - no Illicit Drug Use - no still drives has a living will, states wouldn't like CPR or intubation , requested patient a copy (06-15-07)  Review of Systems       denies fevers. She admits to some hip  pain, but no arthralgias per se   Physical Exam  General:  alert and well-developed.  Skin:  abdomen and legs :  macular , red, scaly, and dry patches upper back: very few similar lesions  no hives perse    Impression & Recommendations:  Problem # 1:  RASH-NONVESICULAR (ICD-782.1) etiology unclear.  It does get better when she takes prednisone.  1.- current meds for her rash: Zantac 75 75 Mg Tabs (Ranitidine hcl) .... One a day Doxepin Hcl 25 Mg Caps (Doxepin hcl) .Marland Kitchen.. 1 by mouth at bedtime  Zyrtec Hives Relief 10 Mg Tabs (Cetirizine hcl) .Marland Kitchen.. 1 by mouth once daily as needed  Hydroxyzine Hcl 25 Mg Tabs (Hydroxyzine hcl) .Marland Kitchen.. 1-2 by mouth at  bedtime 2.- Drug-related? -- had a mild rash initially, but worsened after Cardura  and after aranesp --in looking at her current medication list I don't see any new drugs  compared to few months ago. 3.- plan: will see  Dr. Maple Hudson in 1 month, will try to get a sooner appointment   4.- prednisone again  Problem # 2:  HYPERTENSION (ICD-401.9) Assessment: Improved improving  Her updated medication list for this problem includes:    Lasix 20 Mg Tabs (Furosemide) .Marland Kitchen... 2 every am, 2 @ 2pm    Metoprolol Tartrate 25 Mg Tabs (Metoprolol tartrate) .Marland Kitchen... 2 by mouth two times a day    Amlodipine Besylate 10 Mg Tabs (Amlodipine besylate) .Marland Kitchen... 1 by mouth daily    Benazepril Hcl 20 Mg Tabs (Benazepril hcl) .Marland Kitchen... 1 by mouth once daily  BP today: 140/72 Prior BP: 144/50 (05/13/2009)  Labs Reviewed: K+: 4.9 (05/28/2009) Creat: : 2.5 (05/28/2009)   Chol: 174 (04/19/2008)   HDL: 49.1 (04/19/2008)   LDL: 105 (04/19/2008)   TG: 99 (04/19/2008)  Complete Medication List: 1)  Citalopram Hydrobromide 10 Mg Tabs (Citalopram hydrobromide) .Marland Kitchen.. 1 by mouth once daily 2)  Folic Acid 1 Mg Tabs (Folic acid) .... One p.o. daily 3)  Omeprazole 20 Mg Cpdr (Omeprazole) .Marland Kitchen.. 1 by mouth qd 4)  Lasix 20 Mg Tabs (Furosemide) .... 2 every am, 2 @ 2pm 5)  Alprazolam 0.5 Mg Tabs (Alprazolam) .Marland Kitchen.. 1 by mouth qhs 6)  Metoprolol Tartrate 25 Mg Tabs (Metoprolol tartrate) .... 2 by mouth two times a day 7)  Vicodin 5-500 Mg Tabs (Hydrocodone-acetaminophen) .... 1.5 by mouth three times a day 8)  Kionex Powd (Sodium polystyrene sulfonate) .... Dissolve in water as directed 9)  Amlodipine Besylate 10 Mg Tabs (Amlodipine besylate) .Marland Kitchen.. 1 by mouth daily 10)  Benazepril Hcl 20 Mg Tabs (Benazepril hcl) .Marland Kitchen.. 1 by mouth once daily 11)  Zantac 75 75 Mg Tabs (Ranitidine hcl) .... One a day 12)  Doxepin Hcl 25 Mg Caps (Doxepin hcl) .Marland Kitchen.. 1 by mouth qhs 13)  Zyrtec Hives Relief 10 Mg Tabs (Cetirizine hcl) .Marland Kitchen.. 1 by mouth once daily as  needed 14)  Hydroxyzine Hcl 25 Mg Tabs (Hydroxyzine hcl) .Marland Kitchen.. 1-2 by mouth at bedtime 15)  Prednisone 5 Mg Tabs (Prednisone) .... 4 by mouth once daily x 3, 3x3,2x3,1x3  Patient Instructions: 1)  take  prednisone as prescribed. 2)  Will refer you to our allergist Prescriptions: PREDNISONE 5 MG TABS (PREDNISONE) 4 by mouth once daily x 3, 3x3,2x3,1x3  #30 x 0   Entered and Authorized by:   Nolon Rod. Xavi Tomasik MD   Signed by:   Nolon Rod. Timon Geissinger MD on 06/04/2009   Method used:   Electronically to        [facility]  [id number]* (retail)       [phone]  [address]       [place], [place]  [address]       Ph: [phone] or [phone]       Fax: [phone]   RxID:   [id number]

## 2010-07-03 NOTE — Miscellaneous (Signed)
[facility]   Imported By: Lanelle Bal 08/05/2009 13:37:23  _____________________________________________________________________  External Attachment:    Type:   Image     Comment:   External Document

## 2010-07-03 NOTE — Progress Notes (Signed)
Summary: FYI-verbal order for PT  Phone Note From Other Clinic Call back at [phone]   Caller: Nurse-[facility] Summary of Call: went to see patient after being discharge from hospital and needed to get verbal order for PT 3x a week at time of evaluation pt was still having some issues w/ balance gave verbal order says patient will also be receiving OT but they will call once they do there evaluation on patient.Marland KitchenMarland KitchenDoristine Devoid  July 09, 2009 5:22 PM   Follow-up for Phone Call        agree w/ PT Sahasra Belue E. Deshawn Witty MD  July 10, 2009 12:49 PM

## 2010-07-03 NOTE — Op Note (Signed)
Summary: [facility]  [facility]   Imported By: Maryln Gottron 03/05/2010 15:01:53  _____________________________________________________________________  External Attachment:    Type:   Image     Comment:   External Document

## 2010-07-03 NOTE — Letter (Signed)
Summary: [facility]  [facility]   Imported By: Lanelle Bal 07/11/2009 16:01:33  _____________________________________________________________________  External Attachment:    Type:   Image     Comment:   External Document

## 2010-07-03 NOTE — Progress Notes (Signed)
  Phone Note Call from Patient   Caller: Patient Call For: Birdie Sons MD Summary of Call: Pt wants a home health referral to [place]. Initial call taken by: Lynann Beaver CMA,  March 19, 2010 1:31 PM  Follow-up for Phone Call        ok Follow-up by: Birdie Sons MD,  March 19, 2010 3:46 PM  Additional Follow-up for Phone Call Additional follow up Details #1::        Faxed order to [facility].  They will contact pt directly to schedule. Additional Follow-up by: Corky Mull,  March 19, 2010 4:39 PM

## 2010-07-03 NOTE — Letter (Signed)
Summary: [facility]  [facility]   Imported By: Maryln Gottron 02/25/2010 14:52:23  _____________________________________________________________________  External Attachment:    Type:   Image     Comment:   External Document

## 2010-07-03 NOTE — Letter (Signed)
Summary: [facility]  [facility]   Imported By: Maryln Gottron 04/21/2010 09:52:05  _____________________________________________________________________  External Attachment:    Type:   Image     Comment:   External Document

## 2010-07-03 NOTE — Letter (Signed)
Summary: [facility]  [facility]   Imported By: Lanelle Bal 11/11/2009 13:29:39  _____________________________________________________________________  External Attachment:    Type:   Image     Comment:   External Document

## 2010-07-03 NOTE — Assessment & Plan Note (Signed)
Summary: Uti?dm   Vital Signs:  Patient profile:   77 year old female Weight:      133 pounds Temp:     97.7 degrees F oral BP sitting:   166 / 62  (left arm) Cuff size:   regular  Vitals Entered By: Alfred Levins, CMA (May 01, 2010 11:48 AM) CC: still having some dysuria   Primary Care Provider:  Birdie Sons MD  CC:  still having some dysuria.  History of Present Illness: patient has developed dysuria over the past one week. No fever, chills, back pain. She denies any hematuria.  Patient denies any other complaints.  Current Medications (verified): 1)  Folic Acid 1 Mg Tabs (Folic Acid) .... One P.o. Daily 2)  Alprazolam 0.5 Mg  Tabs (Alprazolam) .Marland Kitchen.. 1 By Mouth Qhs 3)  Vicodin 5-500 Mg Tabs (Hydrocodone-Acetaminophen) .... Take 1/2 Two Times A Day As Needed 4)  Bisoprolol Fumarate 10 Mg Tabs (Bisoprolol Fumarate) .... Take 1 Tablet By Mouth Once A Day 5)  Lisinopril 40 Mg Tabs (Lisinopril) .... One By Mouth Daily 6)  Hydralazine Hcl 25 Mg Tabs (Hydralazine Hcl) .... Take 1 Tablet By Mouth Three Times A Day. Must Get Further Refills From Primary Care Dr! 7)  Omeprazole 20 Mg Cpdr (Omeprazole) .... One By Mouth Daily 8)  Felodipine 5 Mg Xr24h-Tab (Felodipine) .... One By Mouth Once Daily 9)  Miralax  Powd (Polyethylene Glycol 3350) .Marland Kitchen.. 1 Cap Ful At Bedtime 10)  Prednisone 10 Mg Tabs (Prednisone) .... Take 1 Tab Daily  Allergies (verified): 1)  ! Doxycycline Hyclate (Doxycycline Hyclate) 2)  ! Cardura 3)  ! Clonidine Hcl (Clonidine Hcl) 4)  Erythromycin Ethylsuccinate 5)  * Penicillins Group 6)  * Sulfa (Sulfonamides) Group 7)  Demerol (Meperidine Hcl)  Past History:  Past Medical History: Last updated: 04/03/2010 pancytopenia, anemia.  Status-post hematology workup, negative (2010)-MYELODYSPLASTIC SYNDROME LYMPHOCYTIC GASTRITIS DX 7/11 Hypertension anxiety-depression Osteoporosis--s/p forteo x 2 years , per nephrology  no need to Rx  actonel Hyperlipidemia HIP FRACTURE, Bilateral (2007-2008) . s/p L hip surgery (Dr Lajoyce Corners) DJD: hip pain, s/p local injection w/  Dr Ethelene Hal, was refered to neurosurgery 10-2007 (Dr Yetta Barre) for spinal stenosis , had surgery Colonoscopy 06-2006: hyperplastic polyps h/o  CVA KIDNEY DISEASE, CHRONIC  h/o CARCINOMA, RENAL CELL--> s/p radiofrecuency ablation STRICTURE, ESOPHAGEAL REMOTE GASTRIC ULCER GLAUCOMA NOS   CONGESTIVE HEART FAILURE w/  SYSTOLIC DYSFUNCTION   RAYNAUD'S DISEASE (ICD-443.0) HYPERHOMOCYSTEINEMIA   Past Surgical History: Last updated: 12/13/2009 Hysterectomy Cataract extraction stripping of varicose veins Tubal Ligation hip surgery x 2  Spine surgery status post RFA right renal carcinoma  Family History: Last updated: 04/03/2010 [place] Family History of Breast Cancer:Sister Family History of Diabetes: Brother No FH of Colon Cancer:  Social History: Last updated: 06/12/2009 used to live w/ husband at their house until 10-08 Now live by self, lost husband in 08-07-07 1 son  Alcohol Use - no Illicit Drug Use - no still drives has a living will, states wouldn't like CPR or intubation , requested patient  provide a copy (06-15-07)  Risk Factors: Smoking Status: never (01/27/2010)  Physical Exam  General:  well-developed well-nourished female. HEENT exam atraumatic, normocephalic, neck supple. Chest clear to auscultation there is no CVA or suprapubic tenderness.   Impression & Recommendations:  Problem # 1:  UTI (ICD-599.0) will check urine culture. I don't want to commit her to long-term antibiotics for prophylactic use of antibiotics at this time. Orders: T-Culture, Urine ([id number]) UA Dipstick  w/o Micro (automated)  (81003)  Complete Medication List: 1)  Folic Acid 1 Mg Tabs (Folic acid) .... One p.o. daily 2)  Alprazolam 0.5 Mg Tabs (Alprazolam) .Marland Kitchen.. 1 by mouth qhs 3)  Vicodin 5-500 Mg Tabs (Hydrocodone-acetaminophen) .... Take 1/2 two times a day as  needed 4)  Bisoprolol Fumarate 10 Mg Tabs (Bisoprolol fumarate) .... Take 1 tablet by mouth once a day 5)  Lisinopril 40 Mg Tabs (Lisinopril) .... One by mouth daily 6)  Hydralazine Hcl 25 Mg Tabs (Hydralazine hcl) .... Take 1 tablet by mouth three times a day. must get further refills from primary care dr! 7)  Omeprazole 20 Mg Cpdr (Omeprazole) .... One by mouth daily 8)  Felodipine 5 Mg Xr24h-tab (Felodipine) .... One by mouth once daily 9)  Miralax Powd (Polyethylene glycol 3350) .Marland Kitchen.. 1 cap ful at bedtime 10)  Prednisone 10 Mg Tabs (Prednisone) .... Take 1 tab daily  Patient Instructions: 1)  . Prescriptions: NITROFURANTOIN MACROCRYSTAL 100 MG CAPS (NITROFURANTOIN MACROCRYSTAL) Take 1 tablet by mouth two times a day for 10 days.  #20 x 0   Entered and Authorized by:   Birdie Sons MD   Signed by:   Birdie Sons MD on 05/01/2010   Method used:   Electronically to        [facility]  [phone]* (retail)       [address]       [place], [place]  [address]       Ph: [phone] or [phone]       Fax: [phone]   RxID:   [id number]    Orders Added: 1)  Est. Patient Level III [10932] 2)  T-Culture, Urine [[id number]] 3)  UA Dipstick w/o Micro (automated)  [81003]    Laboratory Results   Urine Tests    Routine Urinalysis   Color: yellow Appearance: Clear Glucose: negative   (Normal Range: Negative) Bilirubin: negative   (Normal Range: Negative) Ketone: negative   (Normal Range: Negative) Spec. Gravity: 1.025   (Normal Range: 1.003-1.035) Blood: 1+   (Normal Range: Negative) pH: 5.0   (Normal Range: 5.0-8.0) Protein: 3+   (Normal Range: Negative) Urobilinogen: 0.2   (Normal Range: 0-1) Nitrite: negative   (Normal Range: Negative) Leukocyte Esterace: 1+   (Normal Range: Negative)    Comments: Rita Ohara  May 01, 2010 11:42 AM

## 2010-07-03 NOTE — Progress Notes (Signed)
  Phone Note Outgoing Call   Call placed by: [facility] CMA AAMA,  June 11, 2010 11:28 AM Call placed to: Specialist Action Taken: Phone Call Completed, Information Sent Reason for Call: Discuss lab or test results Summary of Call: Hematology called with abnormal labs, and faxed to Dr. Alver Fisher office with attention to [facility]. Initial call taken by: [facility] CMA AAMA,  June 11, 2010 11:30 AM

## 2010-07-03 NOTE — Progress Notes (Signed)
Summary: BP readings  Phone Note From Other Clinic   Caller: Physical Therapy Call For: Dr. [name] Summary of Call: 188/80 at PT, and wonders if there should be some scale to monitor and give meds by......Marland KitchenHer BP cuff is reading lower than the PT dept. [phone] David Initial call taken by: Lynann Beaver CMA AAMA,  March 25, 2010 9:26 AM  Follow-up for Phone Call        clonidine 0.1 mg by mouth three times a day as needed for BP > 160/90 #30/3 refills Follow-up by: Birdie Sons MD,  March 25, 2010 4:44 PM  Additional Follow-up for Phone Call Additional follow up Details #1::        Left message on PT voice mail. Additional Follow-up by: Lynann Beaver CMA AAMA,  March 26, 2010 10:06 AM

## 2010-07-03 NOTE — Letter (Signed)
Summary: [facility]  [facility]   Imported By: Maryln Gottron 03/11/2010 14:20:57  _____________________________________________________________________  External Attachment:    Type:   Image     Comment:   External Document

## 2010-07-03 NOTE — Assessment & Plan Note (Signed)
Summary: 3 wks/mm   Vital Signs:  Patient profile:   77 year old female BP sitting:   155 / 90  Serial Vital Signs/Assessments:  Time      Position  BP       Pulse  Resp  Temp     By                     155/90                         Birdie Sons MD   Primary Care Provider:  Birdie Sons MD   History of Present Illness: Chronic back pain---followed by dr. Ethelene Hal  She complains of being unsteady---no falls  HTN---tolerating medications  Intermittent dysuria for several weeks  Admits to chronic fatigue  All other systems reviewed and were negative   Allergies: 1)  ! Doxycycline Hyclate (Doxycycline Hyclate) 2)  ! Cardura 3)  Erythromycin Ethylsuccinate 4)  * Penicillins Group 5)  * Sulfa (Sulfonamides) Group 6)  Demerol (Meperidine Hcl)  Past History:  Past Medical History: Last updated: 10/15/2009 pancytopenia, anemia.  Status-post hematology workup, negative (2010)-MYELODYSPLASTIC SYNDROME Hypertension anxiety-depression Osteoporosis--s/p forteo x 2 years , per nephrology  no need to Rx actonel Hyperlipidemia HIP FRACTURE, Bilateral (2007-2008) . s/p L hip surgery (Dr Lajoyce Corners) DJD: hip pain, s/p local injection w/  Dr Ethelene Hal, was refered to neurosurgery 10-2007 (Dr Yetta Barre) for spinal stenosis , had surgery Colonoscopy 06-2006: hyperplastic polyps h/o  CVA KIDNEY DISEASE, CHRONIC  h/o CARCINOMA, RENAL CELL--> s/p radiofrecuency ablation STRICTURE, ESOPHAGEAL REMOTE GASTRIC ULCER GLAUCOMA NOS   CONGESTIVE HEART FAILURE w/  SYSTOLIC DYSFUNCTION   RAYNAUD'S DISEASE (ICD-443.0) HYPERHOMOCYSTEINEMIA   Past Surgical History: Last updated: 12/13/2009 Hysterectomy Cataract extraction stripping of varicose veins Tubal Ligation hip surgery x 2  Spine surgery status post RFA right renal carcinoma  Family History: Last updated: 10/15/2009 [place] Family History of Breast Cancer:Sister Family History of Diabetes: Brother  Social History: Last updated:  06/12/2009 used to live w/ husband at their house until 10-08 Now live by self, lost husband in 08-07-07 1 son  Alcohol Use - no Illicit Drug Use - no still drives has a living will, states wouldn't like CPR or intubation , requested patient  provide a copy (06-15-07)  Risk Factors: Smoking Status: never (01/27/2010)  Physical Exam  General:  alert and well-developed.   Head:  normocephalic and atraumatic.   Eyes:  pupils equal and pupils round.   Ears:  R ear normal and L ear normal.   Neck:  No deformities, masses, or tenderness noted. Lungs:  normal respiratory effort and no intercostal retractions.   Heart:  normal rate and regular rhythm.   Abdomen:  soft and non-tender.   Neurologic:  cranial nerves II-XII intact and gait normal.   Skin:  turgor normal and color normal.     Impression & Recommendations:  Problem # 1:  FATIGUE (ICD-780.79) reviewed labs I suspect related to deconditioning  Problem # 2:  MYELODYSPLASTIC SYNDROME (ICD-238.75) followed by hematology check labsby dr shadad---reviewed our most recent labs documenting pancytopenia.   Problem # 3:  UNSTEADY GAIT (ICD-781.2)  she will consider PT she will call if acceptable to her  Problem # 4:  HYPERTENSION (ICD-401.9) repeat bp is some better continue current medications  see me 6 weeks Her updated medication list for this problem includes:    Bisoprolol Fumarate 10 Mg Tabs (Bisoprolol fumarate) .Marland Kitchen... Take  1 tablet by mouth once a day    Lisinopril 40 Mg Tabs (Lisinopril) ..... One by mouth daily    Hydralazine Hcl 25 Mg Tabs (Hydralazine hcl) .Marland Kitchen... Take 1 tablet by mouth three times a day. must get further refills from primary care dr!  Prior BP: 182/76 (02/24/2010)  Labs Reviewed: K+: 5.0 (01/02/2010) Creat: : 1.8 (12/27/2009)   Chol: 174 (04/19/2008)   HDL: 49.1 (04/19/2008)   LDL: 105 (04/19/2008)   TG: 99 (04/19/2008)  Complete Medication List: 1)  Folic Acid 1 Mg Tabs (Folic acid) .... One  p.o. daily 2)  Alprazolam 0.5 Mg Tabs (Alprazolam) .Marland Kitchen.. 1 by mouth qhs 3)  Vicodin 5-500 Mg Tabs (Hydrocodone-acetaminophen) .... Take 1/2 two times a day as needed 4)  Bisoprolol Fumarate 10 Mg Tabs (Bisoprolol fumarate) .... Take 1 tablet by mouth once a day 5)  Lisinopril 40 Mg Tabs (Lisinopril) .... One by mouth daily 6)  Hydralazine Hcl 25 Mg Tabs (Hydralazine hcl) .... Take 1 tablet by mouth three times a day. must get further refills from primary care dr! 7)  Cipro 250 Mg Tabs (Ciprofloxacin hcl) .Marland Kitchen.. 1 by mouth 2 times daily  Other Orders: UA Dipstick w/o Micro (automated)  (81003) T-Culture, Urine ([id number])  Patient Instructions: 1)  see me 6 weeks   Orders Added: 1)  UA Dipstick w/o Micro (automated)  [81003] 2)  Est. Patient Level IV [14782] 3)  T-Culture, Urine [[id number]]

## 2010-07-03 NOTE — Procedures (Signed)
Summary: Upper Endoscopy  Patient: [name] Note: All result statuses are Final unless otherwise noted.  Tests: (1) Upper Endoscopy (EGD)   EGD Upper Endoscopy       DONE     [facility]     [address] [place], [place]  [address]           ENDOSCOPY PROCEDURE REPORT           PATIENT:  [name], [name]  MR#:  [id number]     BIRTHDATE:  Jun 12, 1925, 84 yrs. old  GENDER:  female           ENDOSCOPIST:  Hedwig Morton. Juanda Chance, MD     Referred by:           PROCEDURE DATE:  10/25/2009     PROCEDURE:  EGD with biopsy     ASA CLASS:  Class III     INDICATIONS:  nausea and vomiting recurrent admision           MEDICATIONS:   Versed 3 mg, Fentanyl 37.5 mcg     TOPICAL ANESTHETIC:  Cetacaine Spray           DESCRIPTION OF PROCEDURE:   After the risks benefits and     alternatives of the procedure were thoroughly explained, informed     consent was obtained.  The  endoscope was introduced through the     mouth and advanced to the second portion of the duodenum, without     limitations.  The instrument was slowly withdrawn as the mucosa     was fully examined.     <<PROCEDUREIMAGES>>           Retained food was present (see image3 and image8). solid and     liquid food retained in the stomach  Mild gastritis was found.     With standard forceps, a biopsy was obtained and sent to pathology     (see image7, image6, and image4).  irregular Z-line in the distal     esophagus. r/o Barrett's esophagus With standard forceps, a biopsy     was obtained and sent to pathology (see image1, image2, and     image9).  Otherwise the examination was normal (see image5).     normal gastric and duodenal outlet,    Retroflexed views revealed     no abnormalities.    The scope was then withdrawn from the patient     and the procedure completed.           COMPLICATIONS:  None           ENDOSCOPIC IMPRESSION:     1) Food, retained     2) Mild gastritis     3) Irregular Z-line in the distal  esophagus     4) Otherwise normal examination     no evidence opf GOO or duodenal obstruction,     RECOMMENDATIONS:     1) Await biopsy results     see chart for orders, GES     begin Reglan 5 mg ac and hs     PPI, Carafate     consider SBFT           REPEAT EXAM:  In 0 year(s) for.           ______________________________     Hedwig Morton. Juanda Chance, MD           CC:  n.     eSIGNED:   Hedwig Morton. Tyrone Pautsch at 10/25/2009 11:39 AM           Page 2 of 3   [name], [name], [id number]  Note: An exclamation mark (!) indicates a result that was not dispersed into the flowsheet. Document Creation Date: 10/25/2009 11:40 AM _______________________________________________________________________  (1) Order result status: Final Collection or observation date-time: 10/25/2009 11:32 Requested date-time:  Receipt date-time:  Reported date-time:  Referring Physician:   Ordering Physician: Lina Sar [phone]) Specimen Source:  Source: Launa Grill Order Number: [id number] Lab site:

## 2010-07-03 NOTE — Discharge Summary (Signed)
Summary: Diarrhea  NAME:  [name], [name]                  ACCOUNT NO.:  [id number]      MEDICAL RECORD NO.:  [id number]          PATIENT TYPE:  INP      LOCATION:  1336                         FACILITY:  [facility]      PHYSICIAN:  Richarda Overlie, MD       DATE OF BIRTH:  [birth year]      DATE OF ADMISSION:  10/24/2009   DATE OF DISCHARGE:  10/27/2009                                  DISCHARGE SUMMARY      PRIMARY CARE PHYSICIAN:  Willow Ora, MD      HEMATOLOGIST/ONCOLOGIST:  Blenda Nicely. Clelia Croft, MD      NEPHROLOGIST:  Primitivo Gauze, MD      GASTROENTEROLOGIST:  Lina Sar, MD.      DISCHARGE DIAGNOSES:   1. Diarrhea of unclear etiology.   2. Uncontrolled hypertension.   3. Stage 3 chronic kidney disease.   4. Myelodysplastic syndrome with pancytopenia and thrombocytopenia.   5. History of renal cell carcinoma.   6. History of cerebrovascular accident.   7. Gastroesophageal reflux disease.   8. History of esophageal stricture   9. Newly diagnosed gastroparesis.   10.Gout.   11.Seasonal allergies.   12.History of peptic ulcer disease.      PROCEDURES:   1. Status post endoscopy that showed retained food in the stomach with       diffuse gastritis, irregular Z line in the distal esophagus, status       post biopsy results pending.  No evidence of gastric outlet       obstruction or duodenal obstruction.   2. Nuclear medicine gastric emptying study showed delayed gastric       emptying consistent with gastroparesis.   3. Abdominal KUB on May 26 shows no acute cardiopulmonary process but       shows left complex lumbar scoliosis, chronic right sided rotator       cuff tear.      SUBJECTIVE:  This is a 77 year old female well known to Dr. Juanda Chance who   follows her for peptic ulcer disease as well as history of esophageal   stricture and gastroesophageal reflux who presents to the ED with a   chief complaint of nausea, vomiting, dehydration and diarrhea over the   past 24 hours.  The  patient was recently hospitalized from the May 18 to   20 for a similar presentation.  At that time she did not undergo EGD   because of low platelet count.  She did have an elevated alkaline   phosphatase at that time and MRCP showed borderline mild intra and extra   chronic biliary dilatation.  Fractionated alkaline phosphatase confirmed   of liver origin.  No further workup was planned at that point and the   patient was asked to follow up in the outpatient setting.  The patient   presented back with nausea, vomiting and diarrhea.  Because of her   recent hospitalization and recent treatment with antibiotics because of   a urinary tract infection, she was admitted to the  hospital.      HOSPITAL COURSE:   1. Nausea, vomiting, diarrhea.  C difficile toxin A and B were found       to be negative.  The patient was empirically started on Florastor.       She had a gastric emptying study that showed gastroparesis and the       patient was started empirically on Reglan with good response.       Nausea and vomiting resolved.  Diarrhea also resolved after 48       hours of hospitalization.  The patient was managed conservatively       with IV fluids and Florastor.  No colonoscopy for the diarrhea has       been planned at this time because of the patient's low platelet       count.   2. Hypertension.  The patient is apparently allergic to Norvasc.  As       her blood pressure was uncontrolled on labetalol  alone, she is       being started on hydralazine.  A test dose will be given in the       hospital prior to her discharge to make sure she is not allergic to       it.   3. Myelodysplastic syndrome.  The patient continued to have a platelet       count of 22-26 which is her baseline with no obvious signs of       bleeding.  This can be followed on an outpatient basis by Dr.       Clelia Croft.   4. Chronic kidney disease.  The patient's creatinine remained stable       at 1.4-1.6 which is her  baseline.      DISPOSITION:  The patient's diet was advanced to mechanical soft diet as   tolerated and the patient did express her desire to go home if she   tolerates a diet.  Dr. Elnoria Howard saw the patient prior to discharge and said   that this would be okay from his standpoint.  He recommends small   frequent meals, continuation of Reglan and follow up with Dr. Juanda Chance in   the next 2-3 weeks.      DISCHARGE MEDICATIONS:   1. Hydralazine 25 mg p.o. three times a day.   2. Protonix 40 mg p.o. twice a day.   3. Reglan 5 mg p.o. q.a.c.   4. Florastor 250 mg p.o. twice a day.   5. Carafate 1 gram p.o. twice daily.   6. Allopurinol 100 mg p.o. daily.   7. Ethacrynic acid 25 mg 1 tablet p.o. twice a day.   8. Folic acid 1 tablet p.o. daily.   9. Labetalol 300 mg p.o. twice a day.   10.Norvasc continued if okay with nephrology.   11.Nystatin 5 mL p.o. three times a day.   12.Procrit one injection subcu every 2 weeks.   13.Tylenol (Equate) 1 tablet p.o. daily as needed at bedtime.   14.Vicodin 5/500, one tablets p.o. daily as needed.   15.Xanax 0.5 mg 1 tablet p.o. daily at bedtime.   16.Zofran 4 mg p.o. q.6 hours as needed.               Richarda Overlie, MD            NA/MEDQ  D:  10/27/2009  T:  10/27/2009  Job:  [id number]      cc:   Willow Ora,  MD   [address] [place], [place] [address]      Blenda Nicely. [place]   Fax: [phone]      [name], M.D.   Fax: [phone]      Hedwig Morton. Juanda Chance, MD   [address]   [place]   [place] [address]

## 2010-07-03 NOTE — Progress Notes (Signed)
Summary: ?edecrin  Phone Note Call from Patient Call back at Home Phone [phone]   Details for Reason: PATIENT LEFT MESSAGE ON VM: Summary of Call: Patient picked up edecrin yesterday & it was $55.  Patient wants to know if you think this is really going to fix her problem because it is so expensive.  Any other alternatives? Shary Decamp  June 24, 2009 10:02 AM   Follow-up for Phone Call        I cannot guarantee her that this will "fix the problem" but  I certainly think she needs to try [facility] E. Judeen Geralds MD  June 24, 2009 11:55 AM   [facility] for pt to return call Shary Decamp  June 24, 2009 4:22 PM  discussed with pt -- pt states that she does not think that she will be able to tolerate 4 tablets a day because she "already has accidents".  Can pt take 1 by mouth two times a day?   Shary Decamp  June 24, 2009 5:00 PM  yes, change medication list [place] E. Vonnie Spagnolo MD  June 24, 2009 5:09 PM     New/Updated Medications: EDECRIN 25 MG TABS (ETHACRYNIC ACID) 1 by mouth two times a day

## 2010-07-03 NOTE — Progress Notes (Signed)
Summary: pt has ?about bp med  Phone Note Call from Patient Call back at [facility] Phone [phone]   Caller: Patient Call For: Birdie Sons MD Action Taken: Patient advised to call 911 Summary of Call: PT has questions concerning clonidine for bp please call pt today. Initial call taken by: Heron Sabins,  March 27, 2010 12:01 PM  Follow-up for Phone Call        Pt states she cannot take Clonidine.  Makes her sleepy and groggy and weak. Follow-up by: Lynann Beaver CMA AAMA,  March 27, 2010 12:28 PM  Additional Follow-up for Phone Call Additional follow up Details #1::        take it as prescribed--as needed for elevated BP (should not have to take it very often at all-if she does we will have to change her scheduled BP meds)---see directions Additional Follow-up by: Birdie Sons MD,  March 27, 2010 1:33 PM    Additional Follow-up for Phone Call Additional follow up Details #2::    Discussed all this with pt, and she states she has to go in the hospital everytime any of her meds are changed and refuses to make changes now. Follow-up by: Lynann Beaver CMA AAMA,  March 27, 2010 2:41 PM  Additional Follow-up for Phone Call Additional follow up Details #3:: Details for Additional Follow-up Action Taken: schedule office visit next week  Already scheduled.  will call earlier if BP is elevated. Lynann Beaver CMA AAMA  March 28, 2010 2:13 PM  Additional Follow-up by: Birdie Sons MD,  March 27, 2010 7:41 PM

## 2010-07-03 NOTE — Procedures (Signed)
Summary: COLON PATHOLOGY  SP Surgical Pathology - STATUS: Final             By: Osie Bond  ,      Perform Date: 10Jan08 00:01  Ordered By: Audrea Muscat           Ordered Date: 11Jan08 11:38  Facility: [facility]                               Department: CPATH  Service Report Text  [facility]   [address] [address]   [place], [place] [address]   Telephone [phone] or [phone] Fax [phone]    REPORT OF SURGICAL PATHOLOGY    Case #: [id number]   Patient Name: [name], [name].   Office Chart Number: [id number]    MRN: [id number]   Pathologist: Renato Battles, M.D.   DOB/Age [birth year] (Age: 77) Gender: F   Date Taken: 06/10/2006   Date Received: 06/11/2006    FINAL DIAGNOSIS    ***MICROSCOPIC EXAMINATION AND DIAGNOSIS***    COLON, BIOPSY AT 10 CM: HYPERPLASTIC POLYP(S). NO ADENOMATOUS   CHANGE OR MALIGNANCY IDENTIFIED.    COMMENT   There are benign colorectal type glands that have a serrated   architecture consistent with a hyperplastic polyp(s). No   adenomatous changes or evidence of malignancy is identified.   (MS:caf 06/14/06)    cf   Date Reported: 06/14/2006 Renato Battles, M.D.   *** Electronically Signed Out By MS ***    Clinical information   Left sided abdominal pain, chronic constipation. R/O adenomatous   (jy)    specimen(s) obtained   Colon, polyp(s at 10 cm    Gross Description   Received in formalin are tan, soft tissue fragments that are   submitted in toto. Number: two.   Size: 0.2 and 0.3 cm, one block. (TB:gt, 06/11/06)    jy/

## 2010-07-03 NOTE — Letter (Signed)
Summary: [facility]  [facility]   Imported By: Lanelle Bal 08/05/2009 09:43:35  _____________________________________________________________________  External Attachment:    Type:   Image     Comment:   External Document

## 2010-07-03 NOTE — Progress Notes (Signed)
Summary: refills to [facility]  Phone Note Refill Request Message from:  Patient on December 27, 2009 10:59 AM  Refills Requested: Medication #1:  BISOPROLOL FUMARATE 5 MG TABS Take 1 tablet by mouth once daily. MUST GET FURTHER REFILLS FROM PRIMARY CARE DR!  Medication #2:  LISINOPRIL 10 MG TABS Take 1 tablet by mouth once a day. MUST GET FURTHER REFILLS FROM PRIMARY CARE DR!  Medication #3:  HYDRALAZINE HCL 25 MG TABS Take 1 tablet by mouth three times a day. MUST GET FURTHER REFILLS FROM PRIMARY CARE DR! Initial call taken by: Kern Reap CMA Duncan Dull),  December 27, 2009 10:59 AM    Prescriptions: HYDRALAZINE HCL 25 MG TABS (HYDRALAZINE HCL) Take 1 tablet by mouth three times a day. MUST GET FURTHER REFILLS FROM PRIMARY CARE DR!  #90 x 4   Entered by:   Kern Reap CMA (AAMA)   Authorized by:   Birdie Sons MD   Signed by:   Kern Reap CMA (AAMA) on 12/27/2009   Method used:   Faxed to ...       [facility] (mail-order)             , [place]         Ph: [phone]       Fax: [phone]   RxID:   [id number] LISINOPRIL 10 MG TABS (LISINOPRIL) Take 1 tablet by mouth once a day. MUST GET FURTHER REFILLS FROM PRIMARY CARE DR!  #90 x 4   Entered by:   Kern Reap CMA (AAMA)   Authorized by:   Birdie Sons MD   Signed by:   Kern Reap CMA (AAMA) on 12/27/2009   Method used:   Faxed to ...       [facility] (mail-order)             , [place]         Ph: [phone]       Fax: [phone]   RxID:   [id number] BISOPROLOL FUMARATE 5 MG TABS (BISOPROLOL FUMARATE) Take 1 tablet by mouth once daily. MUST GET FURTHER REFILLS FROM PRIMARY CARE DR!  #90 x 4   Entered by:   Kern Reap CMA (AAMA)   Authorized by:   Birdie Sons MD   Signed by:   Kern Reap CMA (AAMA) on 12/27/2009   Method used:   Faxed to ...       [facility] (mail-order)             , [place]         Ph: [phone]       Fax: [phone]   RxID:   [id number]

## 2010-07-03 NOTE — Progress Notes (Signed)
Summary: now on labetolol  Phone Note Refill Request   Refills Requested: Medication #1:  omeprazole 20mg  Removed from med list (pt was not taking when d/c'd from hosp earlier this year).  Spoke with pt -- she does take omeprazole as needed [name]  September 05, 2009 4:44 PM     Method Requested: [facility]  Follow-up for Phone Call        FYI -- Dr. Briant Cedar d/c'd metoprolol & started labetolol 300 two times a day [employer]  September 05, 2009 4:46 PM     New/Updated Medications: OMEPRAZOLE 20 MG CPDR (OMEPRAZOLE) 1 by mouth once daily LABETALOL HCL 300 MG TABS (LABETALOL HCL) two times a day Prescriptions: OMEPRAZOLE 20 MG CPDR (OMEPRAZOLE) 1 by mouth once daily  #90 x 0   Entered by:   [name]   Authorized by:   Nolon Rod. Karin Pinedo MD   Signed by:   [name] on 09/05/2009   Method used:   Electronically to        [facility]* (mail-order)             ,          Ph: [phone]       Fax: [phone]   RxID:   [id number]

## 2010-07-03 NOTE — Letter (Signed)
[facility]   Imported By: Sherian Rein 08/09/2009 08:11:06  _____________________________________________________________________  External Attachment:    Type:   Image     Comment:   External Document

## 2010-07-03 NOTE — Initial Assessments (Signed)
Summary: N/V X1 WEEK.  WEAKNESS & FATIGUE.    (DR.BRODIE PT.)   [name]    History of Present Illness Visit Type: Follow-up Visit Primary GI MD: Lina Sar MD Primary Provider: Willow Ora MD Chief Complaint: Nausea,vomiting, weakness & fatique x 1 week History of Present Illness:   77 YO FEMALE KNOWN TO DR. Juanda Chance WITH REMOTE HX OF GASTRIC ULCER,HX CHRONIC CONSTIPATION. SHE HAS HX OF PANCYTOPENIA FELT SECONDARY TO MYELODYSPLASTIC SYNDROME, HX OF CHRONIC RENAL INSUFFICIENCY,,HTN, CVA, AND HAD A RENALCELLCA WHICH WAS ABLATED IN 2006. SHE WAS HOSPITALIZED IN 2/11 WITHHYPERKALEMIA,ACUTE ON CHRONIC RENAL FAILURE,WAS TREATED FOR PNEUMONIS. SHE HAD AN ER VISIT IN 3/11 WITH A UTI. SHE COMES IN NOW WITH C/O 8-9 DAYS OF NO APPETITE,NAUSEA WHICH HAS PROGRESSED TO VOMITING,AND INABILITY TO KEEP DOWN by mouth'S. NO FEVER,CHILLS, BM'S NORMAL,NO MELENA OR HEME. NO DYSPHAGIA. SHE HAS A BAD TASTE IN HER MOUTH. SHE HAS TRIED TO EAT THE PAST 3 DAYS BUT EVERYTHING COMES BACK UP. SHE DID NOT TRY ANYTHING THIS MORNING,INCLUDING HER MEDS. SHE SAYS HER BP IS USUALLY QUITE HIGH-SHE IS NOT SURE WHY IT IS LOWER TODAY,EVEN WITHOUT MEDS.SHE DENIES ANY ABDOMINAL PAIN.SHE HAD A SORE THROAT ON THE RIGHT WHEN HER SXS STARTED WHICH HAS SINCE RESOLVED.   GI Review of Systems    Reports abdominal pain, nausea, and  vomiting.     Location of  Abdominal pain: left side.    Denies acid reflux, belching, bloating, chest pain, dysphagia with liquids, dysphagia with solids, heartburn, loss of appetite, vomiting blood, weight loss, and  weight gain.      Reports diarrhea.     Denies anal fissure, black tarry stools, change in bowel habit, constipation, diverticulosis, fecal incontinence, heme positive stool, hemorrhoids, irritable bowel syndrome, jaundice, light color stool, liver problems, rectal bleeding, and  rectal pain.    Current Medications (verified): 1)  Folic Acid 1 Mg Tabs (Folic Acid) .... One P.o. Daily 2)  Alprazolam 0.5  Mg  Tabs (Alprazolam) .Marland Kitchen.. 1 By Mouth Qhs 3)  Amlodipine Besylate 10 Mg Tabs (Amlodipine Besylate) .... 1/2  By Mouth Daily 4)  Allopurinol 100 Mg Tabs (Allopurinol) .... Take 1 By Mouth Two Times A Day 5)  Labetalol Hcl 300 Mg Tabs (Labetalol Hcl) .... Two Times A Day 6)  Edecrin 25 Mg Tabs (Ethacrynic Acid) .... Two Times A Day 7)  Vicodin 5-500 Mg Tabs (Hydrocodone-Acetaminophen) .... Every Morning  Allergies (verified): 1)  ! Doxycycline Hyclate (Doxycycline Hyclate) 2)  ! Cardura 3)  Erythromycin Ethylsuccinate 4)  * Penicillins Group 5)  * Sulfa (Sulfonamides) Group 6)  Demerol (Meperidine Hcl)  Past History:  Past Medical History: pancytopenia, anemia.  Status-post hematology workup, negative (2010)-MYELODYSPLASTIC SYNDROME Hypertension anxiety-depression Osteoporosis--s/p forteo x 2 years , per nephrology  no need to Rx actonel Hyperlipidemia HIP FRACTURE, Bilateral (2007-2008) . s/p L hip surgery (Dr Lajoyce Corners) DJD: hip pain, s/p local injection w/  Dr Ethelene Hal, was refered to neurosurgery 10-2007 (Dr Yetta Barre) for spinal stenosis , had surgery Colonoscopy 06-2006: hyperplastic polyps h/o  CVA KIDNEY DISEASE, CHRONIC  h/o CARCINOMA, RENAL CELL--> s/p radiofrecuency ablation STRICTURE, ESOPHAGEAL REMOTE GASTRIC ULCER GLAUCOMA NOS   CONGESTIVE HEART FAILURE w/  SYSTOLIC DYSFUNCTION   RAYNAUD'S DISEASE (ICD-443.0) HYPERHOMOCYSTEINEMIA   Past Surgical History: Reviewed history from 06/12/2009 and no changes required. Hysterectomy Cataract extraction stripping of varicose veins Tubal Ligation hip surgery x 2  Spine surgery  Family History: [place] Family History of Breast Cancer:Sister Family History of Diabetes: Brother  Social History: Reviewed history from 06/12/2009 and no changes required. used to live w/ husband at their house until 10-08 Now live by self, lost husband in 08-07-07 1 son  Alcohol Use - no Illicit Drug Use - no still drives has a living will, states  wouldn't like CPR or intubation , requested patient  provide a copy (06-15-07)  Review of Systems       The patient complains of allergy/sinus, anemia, anxiety-new, arthritis/joint pain, back pain, fatigue, itching, shortness of breath, and sore throat.  The patient denies blood in urine, breast changes/lumps, change in vision, confusion, cough, coughing up blood, depression-new, fainting, fever, headaches-new, hearing problems, heart murmur, heart rhythm changes, menstrual pain, muscle pains/cramps, night sweats, nosebleeds, pregnancy symptoms, skin rash, sleeping problems, swelling of feet/legs, swollen lymph glands, thirst - excessive , urination - excessive , urination changes/pain, urine leakage, vision changes, and voice change.         ROS OTHERWISE AS IN HPI  Vital Signs:  Patient profile:   77 year old female Height:      63 inches Weight:      134.50 pounds BMI:     23.91 Pulse rate:   88 / minute Pulse rhythm:   regular BP sitting:   126 / 68  (left arm) Cuff size:   regular  Vitals Entered By: June McMurray CMA Duncan Dull) (Oct 15, 2009 11:11 AM)  Physical Exam  General:  Well developed, well nourished, no acute distress. Head:  Normocephalic and atraumatic. Eyes:  PERRLA, no icterus. Lungs:  Clear throughout to auscultation. Heart:  Regular rate and rhythm; no murmurs, rubs,  or bruits. Abdomen:  SOFT, BS+,MILD MID ABDOMINAL TENDERNESS,WITH SLIGHT FULLNESS,NO HSM, Rectal:  NOT DONE Extremities:  No clubbing, cyanosis, edema or deformities noted. Neurologic:  Alert and  oriented x4;  grossly normal neurologically.weakness noted.   Psych:  Alert and cooperative. Normal mood and affect.   Impression & Recommendations:  Problem # 1:  NAUSEA AND VOMITING (ICD-787.01) Assessment New 77 YO FEMALE WITH 8-9 DAYS HX OF NAUSEA, ANOREXIA,AND VOMITING WITH VERY POOR by mouth INTAKE ,AND ASSOCIATED WEAKNESS,SOME DIZZINESS. ETIOLOGY NOT CLEAR-R/O VIRAL,UTI,OTHER INFECTIOUS PROCESS.SHE  HAS HX OF A GASTRIC ULCER-MUST CONSIDER GASTROPATHY.CONCERNED WITH DEHYDRATION,RENAL FAIRE AT PRESENT GIVEN HER HX.   ADMIT TO [facility] FOR GENTLE HYDRATION,BASELINE LABS,ANTIEMETICS,PPI WILL CHECK UA,CULTURE,CXR,KUB HOLD DIURETIC (EDECRIN) HOLD AMLODIPINE. SEE ORDERS.  Problem # 2:  ANEMIA-UNSPECIFIED (ICD-285.9) Assessment: Comment Only CHRONIC-PANCYTOPENIA SECONDARY TO MYELODYSPLASTIC SYNDROME  Problem # 3:  PERSONAL HX COLONIC POLYPS (ICD-V12.72) Assessment: Comment Only LAST COLON 2008  Problem # 4:  DIVERTICULOSIS OF COLON (ICD-562.10) Assessment: Comment Only  Problem # 5:  UTI'S, RECURRENT (ICD-599.0) Assessment: Comment Only  Problem # 6:  CVA (ICD-434.91) Assessment: Comment Only  Problem # 7:  KIDNEY DISEASE, CHRONIC NOS (ICD-585.9) Assessment: Comment Only  Problem # 8:  CARCINOMA, RENAL CELL (ICD-233.9) Assessment: Comment Only ABLATION 2006  Problem # 9:  CONGESTIVE HEART FAILURE, SYSTOLIC DYSFUNCTION (ICD-428.20) Assessment: Comment Only  Problem # 10:  HYPERTENSION (ICD-401.9) Assessment: Comment Only

## 2010-07-03 NOTE — Progress Notes (Signed)
Summary: Triage   Phone Note Call from Patient Call back at Home Phone [phone]   Caller: Patient Call For: Dr. Juanda Chance Reason for Call: Talk to Nurse Summary of Call: pt is nauseated and vomiting. Said she "cannot keep anything down" x1 week. Initial call taken by: Karna Christmas,  Oct 15, 2009 9:25 AM  Follow-up for Phone Call        Last OV 04-09-2008.  Severe nausea/vomiting for 1 week, "I cannot keep anything down, and everything leaves a terrible taste in my mouth."  Had diarrhea last night. Feels weak.  Was advised by her "kidney and blood"  MD to call GI.  Denies fever, blood, black stools.  Pt. will come to the office for a 10:30am appt. w/Amy Esterwood PAC this morning  Follow-up by: Laureen Ochs LPN,  Oct 15, 2009 9:38 AM

## 2010-07-03 NOTE — Letter (Signed)
Summary: [facility]  [facility]   Imported By: Lanelle Bal 06/12/2009 15:20:04  _____________________________________________________________________  External Attachment:    Type:   Image     Comment:   External Document

## 2010-07-03 NOTE — Progress Notes (Signed)
Summary: verify dose-paged  Phone Note From Pharmacy Call back at [phone]   Caller: Patient Caller: [facility] Call For: Mackenzee Becvar  Summary of Call: need to verify dose for indur Initial call taken by: Rickard Patience,  Oct 25, 2009 9:49 AM  Follow-up for Phone Call        paged pharmacy, will await call back. Carron Curie CMA  Oct 25, 2009 10:10 AM  pt is telling them that she is on Imdur but she is not sure of the dosage so the pharmacy at [facility] was calling to see if we had it on her med list. I do not see where this med has ever been on her med list. I advised she sees Dr. Sherlyn Lick as PMD but we use the same system so no sure he would be much help either. They have also called her pahrmacy and they do dnot have any record of imbur either. Nothing further needed. Carron Curie CMA  Oct 25, 2009 10:21 AM

## 2010-07-03 NOTE — Progress Notes (Signed)
Summary: Call A Nurse   Call-A-Nurse Triage Call Report Triage Record Num: [id number] Operator: Remonia Richter Patient Name: [name] Call Date & Time: 06/10/2010 7:30:33PM Patient Phone: PCP: Nolon Rod. Paz Patient Gender: Female PCP Fax : Patient DOB: 1925-10-13 Practice Name: [facility] Reason for Call: Nancee is calling from [facility] regarding a Platelet Count ordered by Dr [name], done 06/10/10 with Platelets 14, ,WBC 1.9, RBC 3.05,Hgb 10.6,Hct 32.5, Dr Frederica Kuster paged for results and given information, lab faxed to ofice per protocol Protocol(s) Used: PCP Calls, No Triage (Adult) Recommended Outcome per Protocol: Call Provider within 24 Hours Reason for Outcome: Lab calling with test results Care Advice:  ~ 06/10/2010 8:00:56PM Page 1 of 1 [id number]

## 2010-07-03 NOTE — Progress Notes (Signed)
Summary: Hydrocodone  Phone Note Call from Patient   Caller: Patient Call For: Birdie Sons MD Summary of Call: Pt is asking for Hydrocodone refills.  [facility] [phone] Initial call taken by: Lynann Beaver CMA,  January 07, 2010 11:24 AM  Follow-up for Phone Call        can take 1/2 by mouth two times a day as needed #30/1 Follow-up by: Birdie Sons MD,  January 08, 2010 4:15 PM  Additional Follow-up for Phone Call Additional follow up Details #1::        see Rx.  Faxed to [facility].   Additional Follow-up by: Gladis Riffle, RN,  January 09, 2010 10:43 AM    New/Updated Medications: VICODIN 5-500 MG TABS (HYDROCODONE-ACETAMINOPHEN) Take 1/2 two times a day as needed Prescriptions: VICODIN 5-500 MG TABS (HYDROCODONE-ACETAMINOPHEN) Take 1/2 two times a day as needed  #30 x 1   Entered by:   Gladis Riffle, RN   Authorized by:   Birdie Sons MD   Signed by:   Gladis Riffle, RN on 01/09/2010   Method used:   Printed then faxed to ...       [facility] (mail-order)             , [place]         Ph: [phone]       Fax: [phone]   RxID:   [id number]

## 2010-07-03 NOTE — Progress Notes (Signed)
Summary: BP elevation  Phone Note From Other Clinic Call back at [facility] Phone [phone]   Caller: Karsten Ro [phone] PT [name] Summary of Call: 192/70 at rest.  Mild heaache.  Sitting around today.  Bad reaction to Clonidine & not taking it.  No therapy today.   Called patient this am. She thinks she's okay.  Taking all 3 of her BP affecting meds.  She has not checked BP again. No headache this am.  Frequency & pressure over bladder.   Wonders if she's emptying.  All med UTI taken up.  Says she can't manage to get in today.  [facility].  Alergic Pcn, sulfa, demerol, mycins.  Can't take lebetalol, amlodipine, benazepril,benicar.   Initial call taken by: Rudy Jew, RN,  March 31, 2010 8:10 AM  Follow-up for Phone Call        I suspect klonopin is meant to be clonidine. d/c clonidine as she refuses to take it#30/3  Follow-up by: Birdie Sons MD,  March 31, 2010 8:23 AM  Additional Follow-up for Phone Call Additional follow up Details #1::        Clarified that med she can't take is clonidine.  Corrected.  Says she still has a hive on her forehead & chest is spotty from some of the meds she'stried, she thinks.   Additional Follow-up by: Rudy Jew, RN,  March 31, 2010 8:31 AM   New Allergies: ! CLONIDINE HCL (CLONIDINE HCL) Additional Follow-up for Phone Call Additional follow up Details #2::    stop clonidine start felodipine 5 mg by mouth once daily #30/3 Follow-up by: Birdie Sons MD,  March 31, 2010 1:23 PM  New/Updated Medications: FELODIPINE 5 MG XR24H-TAB (FELODIPINE) One by mouth once daily New Allergies: ! CLONIDINE HCL (CLONIDINE HCL)Prescriptions: FELODIPINE 5 MG XR24H-TAB (FELODIPINE) One by mouth once daily  #30 x 3   Entered by:   Rudy Jew, RN   Authorized by:   Birdie Sons MD   Signed by:   Rudy Jew, RN on 03/31/2010   Method used:   Electronically to        [facility]   [phone]* (retail)       [address]       [place], [place]  [address]       Ph: [phone] or [phone]       Fax: [phone]   RxID:   [id number]

## 2010-07-03 NOTE — Letter (Signed)
Summary: Patient [facility] Biopsy Results  [facility]  [address] [place], [place] [address]   Phone: [phone]  Fax: [phone]        May 01, 2010 MRN: [id number]    [facility] [address] [place], [place]  [address]    Dear Ms. Kimoto,  I am pleased to inform you that the biopsies taken during your recent endoscopic examination did not show any evidence of cancer upon pathologic examination.  Additional information/recommendations:  __No further action is needed at this time.  Please follow-up with      your primary care physician for your other healthcare needs.  __ Please call [phone] to schedule a return visit to review      your condition.  _x_ Continue with the treatment plan as outlined on the day of your      exam.     Please call us if you are having persistent problems or have questions about your condition that have not been fully answered at this time.  Sincerely,  Hart Carwin MD  This letter has been electronically signed by your physician.  Appended Document: Patient Notice-Endo Biopsy Results Letter mailed

## 2010-07-03 NOTE — Consult Note (Signed)
Summary: Duard Larsen MD Dermatology  Duard Larsen MD Dermatology   Imported By: Lanelle Bal 07/11/2009 15:38:35  _____________________________________________________________________  External Attachment:    Type:   Image     Comment:   External Document

## 2010-07-03 NOTE — Assessment & Plan Note (Signed)
Summary: 1 MONTH ROV/NJR   Vital Signs:  Patient profile:   77 year old female Weight:      134 pounds Temp:     97.5 degrees F oral Pulse rate:   94 / minute Pulse rhythm:   regular Resp:     12 per minute BP sitting:   182 / 76  Vitals Entered By: Lynann Beaver CMA (February 24, 2010 11:53 AM) CC: rov Is Patient Diabetic? No Pain Assessment Patient in pain? no        Primary Care Sherrill Buikema:  Birdie Sons MD  CC:  rov.  History of Present Illness:  Follow-Up Visit      This is an 77 year old woman who presents for Follow-up visit.  The patient denies chest pain and palpitations.  Since the last visit the patient notes being seen by a specialist---dr ramos for back pain and leg pain.  The patient reports taking meds as prescribed.  When questioned about possible medication side effects, the patient notes none.   states she is sleepy a lot---taking hydrocodone prescribed by ortho and dr Ethelene Hal  chronic difuse pain and fatigue otherwise All other systems reviewed and were negative   Current Problems (verified): 1)  Fatigue  (ICD-780.79) 2)  Personal Hx Colonic Polyps  (ICD-V12.72) 3)  Myelodysplastic Syndrome  (ICD-238.75) 4)  Colitis, Hx of  (ICD-V12.79) 5)  Hiatal Hernia  (ICD-553.3) 6)  Diverticulosis of Colon  (ICD-562.10) 7)  Gerd  (ICD-530.81) 8)  Gout, Unspecified  (ICD-274.9) 9)  Anxiety Depression  (ICD-300.4) 10)  Cva  (ICD-434.91) 11)  Kidney Disease, Chronic Nos  (ICD-585.9) 12)  Carcinoma, Renal Cell  (ICD-233.9) 13)  Glaucoma Nos  (ICD-365.9) 14)  Congestive Heart Failure, Systolic Dysfunction  (ICD-428.20) 15)  Raynaud's Disease  (ICD-443.0) 16)  Hyperlipidemia  (ICD-272.4) 17)  Osteoporosis  (ICD-733.00) 18)  Hypertension  (ICD-401.9)  Current Medications (verified): 1)  Folic Acid 1 Mg Tabs (Folic Acid) .... One P.o. Daily 2)  Alprazolam 0.5 Mg  Tabs (Alprazolam) .Marland Kitchen.. 1 By Mouth Qhs 3)  Vicodin 5-500 Mg Tabs (Hydrocodone-Acetaminophen) .... Take  1/2 Two Times A Day As Needed 4)  Bisoprolol Fumarate 5 Mg Tabs (Bisoprolol Fumarate) .... Take 1 Tablet By Mouth Once Daily. 5)  Lisinopril 40 Mg Tabs (Lisinopril) .... One By Mouth Daily 6)  Hydralazine Hcl 25 Mg Tabs (Hydralazine Hcl) .... Take 1 Tablet By Mouth Three Times A Day. Must Get Further Refills From Primary Care Dr!  Allergies (verified): 1)  ! Doxycycline Hyclate (Doxycycline Hyclate) 2)  ! Cardura 3)  Erythromycin Ethylsuccinate 4)  * Penicillins Group 5)  * Sulfa (Sulfonamides) Group 6)  Demerol (Meperidine Hcl)  Past History:  Past Medical History: Last updated: 10/15/2009 pancytopenia, anemia.  Status-post hematology workup, negative (2010)-MYELODYSPLASTIC SYNDROME Hypertension anxiety-depression Osteoporosis--s/p forteo x 2 years , per nephrology  no need to Rx actonel Hyperlipidemia HIP FRACTURE, Bilateral (2007-2008) . s/p L hip surgery (Dr Lajoyce Corners) DJD: hip pain, s/p local injection w/  Dr Ethelene Hal, was refered to neurosurgery 10-2007 (Dr Yetta Barre) for spinal stenosis , had surgery Colonoscopy 06-2006: hyperplastic polyps h/o  CVA KIDNEY DISEASE, CHRONIC  h/o CARCINOMA, RENAL CELL--> s/p radiofrecuency ablation STRICTURE, ESOPHAGEAL REMOTE GASTRIC ULCER GLAUCOMA NOS   CONGESTIVE HEART FAILURE w/  SYSTOLIC DYSFUNCTION   RAYNAUD'S DISEASE (ICD-443.0) HYPERHOMOCYSTEINEMIA   Past Surgical History: Last updated: 12/13/2009 Hysterectomy Cataract extraction stripping of varicose veins Tubal Ligation hip surgery x 2  Spine surgery status post RFA right renal carcinoma  Family History: Last updated: 10/15/2009 [place] Family History of Breast Cancer:Sister Family History of Diabetes: Brother  Social History: Last updated: 06/12/2009 used to live w/ husband at their house until 10-08 Now live by self, lost husband in 08-07-07 1 son  Alcohol Use - no Illicit Drug Use - no still drives has a living will, states wouldn't like CPR or intubation , requested patient   provide a copy (06-15-07)  Risk Factors: Smoking Status: never (01/27/2010)  Physical Exam  General:  alert and well-developed.  she walks with a cane. She appears in no distress. HEENT exam atraumatic, normocephalic. Neck is supple without lymphadenopathy. Chest is clear to auscultation without increased work of breathing. Cardiac exam S1-S2 are normal without murmurs or gallops. Abdominal exam bowel sounds, soft extremities without cyanosis or edema. Neurologic exam she is alert. She carries on a normal conversation. Sensory functions are grossly intact. She does walk with a cane.   Impression & Recommendations:  Problem # 1:  FATIGUE (ICD-780.79) exacerbated most likely by meds: alprazolam, hydrocodone she depends on these for sleep and pain  Problem # 2:  HYPERTENSION (ICD-401.9) not controlled will change meds Her updated medication list for this problem includes:    Bisoprolol Fumarate 10 Mg Tabs (Bisoprolol fumarate) .Marland Kitchen... Take 1 tablet by mouth once a day    Lisinopril 40 Mg Tabs (Lisinopril) ..... One by mouth daily    Hydralazine Hcl 25 Mg Tabs (Hydralazine hcl) .Marland Kitchen... Take 1 tablet by mouth three times a day. must get further refills from primary care dr!  BP today: 182/76 Prior BP: 190/80 (01/27/2010)  Labs Reviewed: K+: 5.0 (01/02/2010) Creat: : 1.8 (12/27/2009)   Chol: 174 (04/19/2008)   HDL: 49.1 (04/19/2008)   LDL: 105 (04/19/2008)   TG: 99 (04/19/2008)  Complete Medication List: 1)  Folic Acid 1 Mg Tabs (Folic acid) .... One p.o. daily 2)  Alprazolam 0.5 Mg Tabs (Alprazolam) .Marland Kitchen.. 1 by mouth qhs 3)  Vicodin 5-500 Mg Tabs (Hydrocodone-acetaminophen) .... Take 1/2 two times a day as needed 4)  Bisoprolol Fumarate 10 Mg Tabs (Bisoprolol fumarate) .... Take 1 tablet by mouth once a day 5)  Lisinopril 40 Mg Tabs (Lisinopril) .... One by mouth daily 6)  Hydralazine Hcl 25 Mg Tabs (Hydralazine hcl) .... Take 1 tablet by mouth three times a day. must get further refills  from primary care dr!  Patient Instructions: 1)  3 weeks  Prescriptions: BISOPROLOL FUMARATE 10 MG TABS (BISOPROLOL FUMARATE) Take 1 tablet by mouth once a day  #30 x 11   Entered and Authorized by:   Birdie Sons MD   Signed by:   Birdie Sons MD on 02/24/2010   Method used:   Electronically to        [facility]  [phone]* (retail)       [address]       [place], [place]  [address]       Ph: [phone] or [phone]       Fax: [phone]   RxID:   [id number]

## 2010-07-03 NOTE — Progress Notes (Signed)
Summary: PANIC VALUE  Phone Note From Other Clinic   Caller: Nurse Summary of Call: [facility] calling - platelet count is 32,000.  results will be faxed.  Give DEBBY instructions, Fleet Contras is leaving at 3:30 pm. Initial call taken by: Kern Reap CMA Duncan Dull),  December 27, 2009 3:18 PM  Follow-up for Phone Call        long standing problem---no concern Follow-up by: Birdie Sons MD,  December 27, 2009 3:49 PM

## 2010-07-03 NOTE — Letter (Signed)
[facility]   Im[facility] By: Lanelle Bal 07/18/2009 12:20:38  _____________________________________________________________________  External Attachment:    Type:   Image     Comment:   External Document

## 2010-07-03 NOTE — Letter (Signed)
[facility]   Imported By: Lester [place] 10/17/2009 07:50:15  _____________________________________________________________________  External Attachment:    Type:   Image     Comment:   External Document

## 2010-07-03 NOTE — Letter (Signed)
Summary: [facility]  [facility]   Imported By: Lanelle Bal 06/18/2009 11:32:49  _____________________________________________________________________  External Attachment:    Type:   Image     Comment:   External Document

## 2010-07-03 NOTE — Letter (Signed)
[facility]   Imported By: Sherian Rein 01/09/2010 13:58:51  _____________________________________________________________________  External Attachment:    Type:   Image     Comment:   External Document

## 2010-07-03 NOTE — Assessment & Plan Note (Signed)
Summary: [name]   Vital Signs:  Patient Profile:   77 Years Old Female Height:     63 inches Weight:      135.8 pounds Pulse rate:   60 / minute BP sitting:   118 / 78  Vitals Entered By: Shary Decamp (June 14, 2008 9:35 AM)                 PCP:  Willow Ora MD  Chief Complaint:  edema and right hip pain; c/o dizziness w/asa and wants to dicuss lowering dosage.  History of Present Illness: ROV c/o edema-- worse at the end of the day, R>L DJD--right hip pain, pain med reviewed, takes vicodin 5mg  three times a day . Denies getting sleepy w/ pain meds  saw neurosurgery, patient reports they talk about a 2nd back surgery but so far they have not decide about  it. next f/u w/ neurosurgery  07-2008 c/o dizziness -- chronic, not as bad as before, not spinning,worse if stands up, last few seconds  Hypertension-- ambulatory BP yesterday 170s but other times is ok Osteoporosis--s/p forteo x 2 years , we communicate w/ nephrology they did not rec.  actonel renal failure--pt states she started a  "powder to remove the K+" Rx by nephrology      Updated Prior Medication List: CITALOPRAM HYDROBROMIDE 10 MG TABS (CITALOPRAM HYDROBROMIDE) 1/2 by mouth qd ASPIRIN 325 MG TABS (ASPIRIN)  FOLIC ACID 1 MG TABS (FOLIC ACID) one p.o. daily * BD PEN NEEDLES 31G use once daily LOTREL 5-10 MG  CAPS (AMLODIPINE BESY-BENAZEPRIL HCL) 1 by mouth qd OMEPRAZOLE 20 MG  CPDR (OMEPRAZOLE) 1 by mouth qd LASIX 20 MG  TABS (FUROSEMIDE) 2 by mouth qd ALPRAZOLAM 0.5 MG  TABS (ALPRAZOLAM) 1 by mouth qhs METOPROLOL TARTRATE 25 MG  TABS (METOPROLOL TARTRATE) 1 by mouth bid VICODIN 5-500 MG  TABS (HYDROCODONE-ACETAMINOPHEN) 1 by mouth q 4 hours as needed pain LEVSIN/SL 0.125 MG SUBL (HYOSCYAMINE SULFATE) Insert 1 tablet under tongue as needed for abdominal pain. KIONEX  POWD (SODIUM POLYSTYRENE SULFONATE) dissolve in water as directed  Current Allergies (reviewed today): ERYTHROMYCIN ETHYLSUCCINATE *  PENICILLINS GROUP * SULFA (SULFONAMIDES) GROUP DEMEROL (MEPERIDINE HCL)  Past Medical History:    Reviewed history from 04/12/2008 and no changes required:       Hypertension       anxiety-depression       Osteoporosis--s/p forteo x 2 years , nephrology  no need to Rx actonel       Hyperlipidemia       HIP FRACTURE, Bilateral (2007-2008) . s/p L hip surgery (Dr Lajoyce Corners)       DJD: hip pain, s/p local injection w/  Dr Ethelene Hal, was refered to neurosurgery 10-2007 (Dr Yetta Barre) for spinal stenosis , had surgery       Colonoscopy 06-2006: hyperplastic polyps       h/o  CVA       KIDNEY DISEASE, CHRONIC        h/o CARCINOMA, RENAL CELL--> s/p radiofrecuency ablation       STRICTURE, ESOPHAGEAL       GLAUCOMA NOS         CONGESTIVE HEART FAILURE w/  SYSTOLIC DYSFUNCTION         RAYNAUD'S DISEASE (ICD-443.0)       HYPERHOMOCYSTEINEMIA (ICD-270.4)                       Past Surgical History:    Hysterectomy  Cataract extraction    stripping of varicose veins    Tubal Ligation    hip surgery x 2    Social History:    used to live w/ husband at their house until 10-08    Now live by self, lost husband in 08-07-07    1 son     Alcohol Use - no    Illicit Drug Use - no    still drives    has a living will, states wouldn't like CPR or intubation , requested patient a copy (06-15-07)   Risk Factors: Tobacco use:  never Drug use:  no Alcohol use:  no  Mammogram History:    Date of Last Mammogram:  07/30/2007   Review of Systems  General      Denies fever.      has lost some weight, reports poor apetite denies post prandial abd pain, no dysphagia denies depression per se , cryes sometimes when think about her husband  CV      Denies chest pain or discomfort.  Resp      seldom has SOB  GI      Denies abdominal pain and bloody stools.   Physical Exam  General:     alert and well-developed.   Lungs:     Normal respiratory effort, chest expands symmetrically. Lungs are  clear to auscultation, no crackles or wheezes. Heart:     normal rate, regular rhythm, and no murmur.   Extremities:     no pretibial edema bilaterally ,LE symetric in size  Psych:     Oriented X3, memory intact for recent and remote, normally interactive, good eye contact, and not anxious appearing.  Flat affect    Impression & Recommendations:  Problem # 1:  EDEMA- LOCALIZED (ICD-782.3) over all well control, rec to elevate her legs two times a day at least Her updated medication list for this problem includes:    Lasix 20 Mg Tabs (Furosemide) .Marland Kitchen... 2 by mouth qd   Problem # 2:  ANXIETY DEPRESSION (ICD-300.4) counseled,denies depression per se (see ROS), on 5mg  of citalopram, states doesn't like to take more medicine   Problem # 3:  KIDNEY DISEASE, CHRONIC NOS (ICD-585.9) pt states she started a  "powder to remove the K+" Rx by nephrology    Problem # 4:  DEGENERATIVE DISC DISEASE (ICD-722.6) right hip pain, pain med reviewed, takes vicodin 5mg  three times a day .  saw neurosurgery, patient reports they talk about a 2nd back surgery but so far they have not decide to do it. next f/u 07-2008 plan: increase vicodin 5 to 1.5 three times a day   Problem # 5:  OSTEOPOROSIS (ICD-733.00) s/p forteo x 2 years , we communicate w/ nephrology they did not rec.  actonel   Problem # 6:  HYPERTENSION (ICD-401.9) states BP varies, see instructions  Her updated medication list for this problem includes:    Lotrel 5-10 Mg Caps (Amlodipine besy-benazepril hcl) .Marland Kitchen... 1 by mouth qd    Lasix 20 Mg Tabs (Furosemide) .Marland Kitchen... 2 by mouth qd    Metoprolol Tartrate 25 Mg Tabs (Metoprolol tartrate) .Marland Kitchen... 1 by mouth bid  BP today: 118/78 Prior BP: 140/60 (05/09/2008)  Labs Reviewed: Creat: 2.0 (05/09/2008) Chol: 174 (04/19/2008)   HDL: 49.1 (04/19/2008)   LDL: 105 (04/19/2008)   TG: 99 (04/19/2008)   Problem # 7:  F2F 25 min discussing  multiple questions about pain contol , other medical  problems and  end of life  care (see SH)  Complete Medication List: 1)  Citalopram Hydrobromide 10 Mg Tabs (Citalopram hydrobromide) .... 1/2 by mouth qd 2)  Aspirin 325 Mg Tabs (Aspirin) 3)  Folic Acid 1 Mg Tabs (Folic acid) .... One p.o. daily 4)  Bd Pen Needles 31g 5mm  .... Use once daily 5)  Lotrel 5-10 Mg Caps (Amlodipine besy-benazepril hcl) .Marland Kitchen.. 1 by mouth qd 6)  Omeprazole 20 Mg Cpdr (Omeprazole) .Marland Kitchen.. 1 by mouth qd 7)  Lasix 20 Mg Tabs (Furosemide) .... 2 by mouth qd 8)  Alprazolam 0.5 Mg Tabs (Alprazolam) .Marland Kitchen.. 1 by mouth qhs 9)  Metoprolol Tartrate 25 Mg Tabs (Metoprolol tartrate) .Marland Kitchen.. 1 by mouth bid 10)  Vicodin 5-500 Mg Tabs (Hydrocodone-acetaminophen) .... 1.5 by mouth three times a day 11)  Levsin/sl 0.125 Mg Subl (Hyoscyamine sulfate) .... Insert 1 tablet under tongue as needed for abdominal pain. 12)  Kionex Powd (Sodium polystyrene sulfonate) .... Dissolve in water as directed   Patient Instructions: 1)  Check your blood pressure 2 or 3 times a week. If it is more than 140/85 consistently,please let us know  2)  Please schedule a follow-up appointment in 4 months.

## 2010-07-03 NOTE — Progress Notes (Signed)
Summary: BP questions.  Phone Note Call from Patient   Caller: Patient Call For: Birdie Sons MD Summary of Call: BP is running 188/61 and  but has been up to 200 at times.   Has some dizziness. [phone] Initial call taken by: Lynann Beaver CMA,  February 10, 2010 11:47 AM  Follow-up for Phone Call        increase lisinopril to 40 mg by mouth once daily  Follow-up by: Birdie Sons MD,  February 10, 2010 3:10 PM    New/Updated Medications: LISINOPRIL 40 MG TABS (LISINOPRIL) one by mouth daily Prescriptions: LISINOPRIL 40 MG TABS (LISINOPRIL) one by mouth daily  #90 x 3   Entered by:   Lynann Beaver CMA   Authorized by:   Birdie Sons MD   Signed by:   Lynann Beaver CMA on 02/10/2010   Method used:   Electronically to        [facility]* (retail)             ,          Ph: [phone]       Fax: [phone]   RxID:   [id number] LISINOPRIL 40 MG TABS (LISINOPRIL) one by mouth daily  #30 x 4   Entered by:   Lynann Beaver CMA   Authorized by:   Birdie Sons MD   Signed by:   Lynann Beaver CMA on 02/10/2010   Method used:   Electronically to        [facility]  [phone]* (retail)       [address]       [place], [place]  [address]       Ph: [phone] or [phone]       Fax: [phone]   RxID:   [id number]  Pt notified.

## 2010-07-03 NOTE — Letter (Signed)
[facility]   Imported By: Lester [place] 12/25/2009 10:57:14  _____________________________________________________________________  External Attachment:    Type:   Image     Comment:   External Document

## 2010-07-03 NOTE — Letter (Signed)
Summary: [facility]  [facility]   Imported By: Maryln Gottron 03/24/2010 11:08:26  _____________________________________________________________________  External Attachment:    Type:   Image     Comment:   External Document

## 2010-07-03 NOTE — Assessment & Plan Note (Signed)
Summary: UTI/dm   Vital Signs:  Patient profile:   77 year old female Temp:     97.8 degrees F oral BP sitting:   170 / 62  (left arm) Cuff size:   regular  Vitals Entered By: Sid Falcon LPN (April 17, 2010 1:46 PM)  History of Present Illness: Patient seen with one day history of burning with urination and some frequency. Denies any nausea, vomiting, or fever. She has some chronic low back pain which is unchanged. History of frequent UTIs in the past. Multiple drug allergies and no intolerance to Cipro.  recent E. coli sensitive to Cipro  Allergies: 1)  ! Doxycycline Hyclate (Doxycycline Hyclate) 2)  ! Cardura 3)  ! Clonidine Hcl (Clonidine Hcl) 4)  Erythromycin Ethylsuccinate 5)  * Penicillins Group 6)  * Sulfa (Sulfonamides) Group 7)  Demerol (Meperidine Hcl)  Past History:  Past Medical History: Last updated: 04/03/2010 pancytopenia, anemia.  Status-post hematology workup, negative (2010)-MYELODYSPLASTIC SYNDROME LYMPHOCYTIC GASTRITIS DX 7/11 Hypertension anxiety-depression Osteoporosis--s/p forteo x 2 years , per nephrology  no need to Rx actonel Hyperlipidemia HIP FRACTURE, Bilateral (2007-2008) . s/p L hip surgery (Dr Lajoyce Corners) DJD: hip pain, s/p local injection w/  Dr Ethelene Hal, was refered to neurosurgery 10-2007 (Dr Yetta Barre) for spinal stenosis , had surgery Colonoscopy 06-2006: hyperplastic polyps h/o  CVA KIDNEY DISEASE, CHRONIC  h/o CARCINOMA, RENAL CELL--> s/p radiofrecuency ablation STRICTURE, ESOPHAGEAL REMOTE GASTRIC ULCER GLAUCOMA NOS   CONGESTIVE HEART FAILURE w/  SYSTOLIC DYSFUNCTION   RAYNAUD'S DISEASE (ICD-443.0) HYPERHOMOCYSTEINEMIA   Physical Exam  General:  Well-developed,well-nourished,in no acute distress; alert,appropriate and cooperative throughout examination Lungs:  Normal respiratory effort, chest expands symmetrically. Lungs are clear to auscultation, no crackles or wheezes. Heart:  normal rate and regular rhythm.   Msk:  no CVA  tenderness   Impression & Recommendations:  Problem # 1:  DYSURIA (ICD-788.1)  suspect recurrent cystitis. Urine culture. Cipro for 7 days  Her updated medication list for this problem includes:    Ciprofloxacin Hcl 250 Mg Tabs (Ciprofloxacin hcl) ..... One by mouth two times a day for 7 days  Orders: UA Dipstick w/o Micro (manual) (91478) T-Culture, Urine ([id number]) Prescription Created Electronically [id number])  Complete Medication List: 1)  Folic Acid 1 Mg Tabs (Folic acid) .... One p.o. daily 2)  Alprazolam 0.5 Mg Tabs (Alprazolam) .Marland Kitchen.. 1 by mouth qhs 3)  Vicodin 5-500 Mg Tabs (Hydrocodone-acetaminophen) .... Take 1/2 two times a day as needed 4)  Bisoprolol Fumarate 10 Mg Tabs (Bisoprolol fumarate) .... Take 1 tablet by mouth once a day 5)  Lisinopril 40 Mg Tabs (Lisinopril) .... One by mouth daily 6)  Hydralazine Hcl 25 Mg Tabs (Hydralazine hcl) .... Take 1 tablet by mouth three times a day. must get further refills from primary care dr! 7)  Omeprazole 20 Mg Cpdr (Omeprazole) .... One by mouth daily 8)  Felodipine 5 Mg Xr24h-tab (Felodipine) .... One by mouth once daily 9)  Miralax Powd (Polyethylene glycol 3350) .Marland Kitchen.. 1 cap ful at bedtime 10)  Ondansetron Hcl 4 Mg Tabs (Ondansetron hcl) .... Take 1 tab twice daily as needed for nausea 11)  Prednisone 20 Mg Tabs (Prednisone) .... Take 1 daily in the am for 1 month 12)  Ciprofloxacin Hcl 250 Mg Tabs (Ciprofloxacin hcl) .... One by mouth two times a day for 7 days  Patient Instructions: 1)  Drink plenty of fluids up to 3-4 quarts a day. Cranberry juice is especially recommended in addition to large amounts of water. Avoid  caffeine & carbonated drinks, they tend to irritate the bladder, Return in 3-5 days if you're not better: sooner if you're feeling worse.  Prescriptions: CIPROFLOXACIN HCL 250 MG TABS (CIPROFLOXACIN HCL) one by mouth two times a day for 7 days  #14 x 0   Entered and Authorized by:   Evelena Peat MD   Signed  by:   Evelena Peat MD on 04/17/2010   Method used:   Electronically to        [facility]  [phone]* (retail)       [address]       [place], [place]  [address]       Ph: [phone] or [phone]       Fax: [phone]   RxID:   [id number]    Orders Added: 1)  Est. Patient Level III [36644] 2)  UA Dipstick w/o Micro (manual) [81002] 3)  T-Culture, Urine [[id number]] 4)  Prescription Created Electronically [id number]    Laboratory Results   Urine Tests    Routine Urinalysis   Color: lt. yellow Appearance: Hazy Glucose: negative   (Normal Range: Negative) Bilirubin: negative   (Normal Range: Negative) Ketone: negative   (Normal Range: Negative) Spec. Gravity: 1.015   (Normal Range: 1.003-1.035) Blood: moderate   (Normal Range: Negative) pH: 6.5   (Normal Range: 5.0-8.0) Protein: >=300   (Normal Range: Negative) Urobilinogen: 0.2   (Normal Range: 0-1) Nitrite: positive   (Normal Range: Negative) Leukocyte Esterace: large   (Normal Range: Negative)    Comments: Sid Falcon LPN  April 17, 2010 1:58 PM

## 2010-07-03 NOTE — Miscellaneous (Signed)
Summary: meds updated from hosp d/c summary  updated meds per d/c summary 07/05/09  Current Meds:  FOLIC ACID 1 MG TABS (FOLIC ACID) one p.o. daily ALPRAZOLAM 0.5 MG  TABS (ALPRAZOLAM) 1 by mouth qhs METOPROLOL TARTRATE 25 MG  TABS (METOPROLOL TARTRATE) 2 by mouth two times a day AMLODIPINE BESYLATE 10 MG TABS (AMLODIPINE BESYLATE) 1 by mouth daily DOXEPIN HCL 25 MG CAPS (DOXEPIN HCL) 1 by mouth qhs ZYRTEC HIVES RELIEF 10 MG TABS (CETIRIZINE HCL) 1 by mouth once daily as needed HYDROXYZINE HCL 25 MG TABS (HYDROXYZINE HCL) 1-2 by mouth at bedtime ALLOPURINOL 100 MG TABS (ALLOPURINOL) take 1 by mouth two times a day * MVI   Shary Decamp  July 30, 2009 10:52 AM  Medications Added * MVI        Clinical Lists Changes  Medications: Removed medication of OMEPRAZOLE 20 MG  CPDR (OMEPRAZOLE) 1 by mouth qd - Signed Removed medication of KIONEX  POWD (SODIUM POLYSTYRENE SULFONATE) dissolve in water as directed - Signed Removed medication of CITALOPRAM HYDROBROMIDE 10 MG TABS (CITALOPRAM HYDROBROMIDE) 1 by mouth once daily - Signed Removed medication of BENAZEPRIL HCL 20 MG TABS (BENAZEPRIL HCL) 1 by mouth once daily - Signed Removed medication of ZANTAC 75 75 MG TABS (RANITIDINE HCL) one a day - Signed Removed medication of PREDNISONE 5 MG TABS (PREDNISONE) 4 by mouth once daily x 3, 3x3,2x3,1x3 - Signed Removed medication of FLORASTOR 250 MG CAPS (SACCHAROMYCES BOULARDII) take 1 by mouth once daily - Signed Removed medication of COLCHICINE 0.6 MG TABS (COLCHICINE) take 1 by mouth two times a day as needed gout - Signed Removed medication of EDECRIN 25 MG TABS (ETHACRYNIC ACID) 1 by mouth two times a day - Signed Added new medication of * MVI - Signed

## 2010-07-03 NOTE — Assessment & Plan Note (Signed)
Summary: 2 MONTH ROV/NJR   Vital Signs:  Patient profile:   77 year old female Weight:      132 pounds Temp:     97.8 degrees F oral Pulse rate:   68 / minute BP sitting:   142 / 66  (left arm) Cuff size:   regular  Vitals Entered By: Azucena Freed,  MA Student CC: 2 mon rov   Primary Care Provider:  Birdie Sons MD  CC:  2 mon rov.  History of Present Illness: MDS---followed by dr Clelia Croft.  HTN---tolerating meds without difficulty  abdominal discomfort this a.m.---minimal nausea   review of systems:. she admits to ongoing fatigue. She denies specific complaints. She denies any GI blood loss. Her appetite is adequate. no other specific complaints.  Current Medications (verified): 1)  Folic Acid 1 Mg Tabs (Folic Acid) .... One P.o. Daily 2)  Alprazolam 0.5 Mg  Tabs (Alprazolam) .Marland Kitchen.. 1 By Mouth Qhs 3)  Vicodin 5-500 Mg Tabs (Hydrocodone-Acetaminophen) .... Take 1/2 Two Times A Day As Needed 4)  Bisoprolol Fumarate 10 Mg Tabs (Bisoprolol Fumarate) .... Take 1 Tablet By Mouth Once A Day 5)  Lisinopril 40 Mg Tabs (Lisinopril) .... One By Mouth Daily 6)  Hydralazine Hcl 25 Mg Tabs (Hydralazine Hcl) .... Take 1 Tablet By Mouth Three Times A Day. Must Get Further Refills From Primary Care Dr! 7)  Omeprazole 20 Mg Cpdr (Omeprazole) .... One By Mouth Daily 8)  Felodipine 5 Mg Xr24h-Tab (Felodipine) .... One By Mouth Once Daily 9)  Miralax  Powd (Polyethylene Glycol 3350) .Marland Kitchen.. 1 Cap Ful At Bedtime 10)  Prednisone 10 Mg Tabs (Prednisone) .... Take 1 Tab Daily  Allergies (verified): 1)  ! Doxycycline Hyclate (Doxycycline Hyclate) 2)  ! Cardura 3)  ! Clonidine Hcl (Clonidine Hcl) 4)  Erythromycin Ethylsuccinate 5)  * Penicillins Group 6)  * Sulfa (Sulfonamides) Group 7)  Demerol (Meperidine Hcl)  Past History:  Past Medical History: Last updated: 04/03/2010 pancytopenia, anemia.  Status-post hematology workup, negative (2010)-MYELODYSPLASTIC SYNDROME LYMPHOCYTIC GASTRITIS  DX 7/11 Hypertension anxiety-depression Osteoporosis--s/p forteo x 2 years , per nephrology  no need to Rx actonel Hyperlipidemia HIP FRACTURE, Bilateral (2007-2008) . s/p L hip surgery (Dr Lajoyce Corners) DJD: hip pain, s/p local injection w/  Dr Ethelene Hal, was refered to neurosurgery 10-2007 (Dr Yetta Barre) for spinal stenosis , had surgery Colonoscopy 06-2006: hyperplastic polyps h/o  CVA KIDNEY DISEASE, CHRONIC  h/o CARCINOMA, RENAL CELL--> s/p radiofrecuency ablation STRICTURE, ESOPHAGEAL REMOTE GASTRIC ULCER GLAUCOMA NOS   CONGESTIVE HEART FAILURE w/  SYSTOLIC DYSFUNCTION   RAYNAUD'S DISEASE (ICD-443.0) HYPERHOMOCYSTEINEMIA   Past Surgical History: Last updated: 12/13/2009 Hysterectomy Cataract extraction stripping of varicose veins Tubal Ligation hip surgery x 2  Spine surgery status post RFA right renal carcinoma  Family History: Last updated: 04/03/2010 [place] Family History of Breast Cancer:Sister Family History of Diabetes: Brother No FH of Colon Cancer:  Social History: Last updated: 06/12/2009 used to live w/ husband at their house until 10-08 Now live by self, lost husband in 08-07-07 1 son  Alcohol Use - no Illicit Drug Use - no still drives has a living will, states wouldn't like CPR or intubation , requested patient  provide a copy (06-15-07)  Risk Factors: Smoking Status: never (01/27/2010)  Physical Exam  General:   elderly female in no acute distress. HEENT exam atraumatic, normocephalic symmetrical muscles are intact. Neck is supple. Chest clear to auscultation cardiac exam S1-S2 are regular. Extremities no edema. Abdominal exam active bowel sounds, soft.  no tenderness to deep palpation. Extremities no edema.   Impression & Recommendations:  Problem # 1:  ABDOMINAL PAIN (ICD-789.00)  I think she needs further evaluation. We'll check laboratory work. the laboratory work is unremarkable I don't think any further evaluation will be necessary unless worsening of  symptoms. Orders: Venipuncture (16109) Specimen Handling (60454) T- * Misc. Laboratory test [phone]) TLB-BMP (Basic Metabolic Panel-BMET) (80048-METABOL) TLB-Hepatic/Liver Function Pnl (80076-HEPATIC) TLB-Amylase (82150-AMYL)  Problem # 2:  RENAL CELL CANCER (ICD-189.0)  reviewed previous CAT scans. she is scheduled to see interventional radiology  Problem # 3:  HYPERLIPIDEMIA (ICD-272.4)  Labs Reviewed: SGOT: 18 (12/27/2009)   SGPT: 15 (12/27/2009)   HDL:49.1 (04/19/2008), 51.0 (11/17/2006)  LDL:105 (04/19/2008), 123 (11/17/2006)  Chol:174 (04/19/2008), 188 (11/17/2006)  Trig:99 (04/19/2008), 71 (11/17/2006)  Problem # 4:  HYPERTENSION (ICD-401.9)  reasonable control.  continue current medications for the time being. Her updated medication list for this problem includes:    Bisoprolol Fumarate 10 Mg Tabs (Bisoprolol fumarate) .Marland Kitchen... Take 1 tablet by mouth once a day    Lisinopril 40 Mg Tabs (Lisinopril) ..... One by mouth daily    Hydralazine Hcl 25 Mg Tabs (Hydralazine hcl) .Marland Kitchen... Take 1 tablet by mouth three times a day. must get further refills from primary care dr!    Felodipine 5 Mg Xr24h-tab (Felodipine) ..... One by mouth once daily  BP today: 142/66 Prior BP: 166/62 (05/01/2010)  Labs Reviewed: K+: 5.0 (01/02/2010) Creat: : 1.8 (12/27/2009)   Chol: 174 (04/19/2008)   HDL: 49.1 (04/19/2008)   LDL: 105 (04/19/2008)   TG: 99 (04/19/2008)  Complete Medication List: 1)  Folic Acid 1 Mg Tabs (Folic acid) .... One p.o. daily 2)  Alprazolam 0.5 Mg Tabs (Alprazolam) .Marland Kitchen.. 1 by mouth qhs 3)  Vicodin 5-500 Mg Tabs (Hydrocodone-acetaminophen) .... Take 1/2 two times a day as needed 4)  Bisoprolol Fumarate 10 Mg Tabs (Bisoprolol fumarate) .... Take 1 tablet by mouth once a day 5)  Lisinopril 40 Mg Tabs (Lisinopril) .... One by mouth daily 6)  Hydralazine Hcl 25 Mg Tabs (Hydralazine hcl) .... Take 1 tablet by mouth three times a day. must get further refills from primary care dr! 7)   Omeprazole 20 Mg Cpdr (Omeprazole) .... One by mouth daily 8)  Felodipine 5 Mg Xr24h-tab (Felodipine) .... One by mouth once daily 9)  Miralax Powd (Polyethylene glycol 3350) .Marland Kitchen.. 1 cap ful at bedtime 10)  Prednisone 10 Mg Tabs (Prednisone) .... Take 1 tab daily  Other Orders: UA Dipstick w/o Micro (automated)  (81003)   Orders Added: 1)  Venipuncture [91478] 2)  Specimen Handling [99000] 3)  UA Dipstick w/o Micro (automated)  [81003] 4)  T- * Misc. Laboratory test [99999] 5)  TLB-BMP (Basic Metabolic Panel-BMET) [80048-METABOL] 6)  TLB-Hepatic/Liver Function Pnl [80076-HEPATIC] 7)  TLB-Amylase [82150-AMYL] 8)  Est. Patient Level IV [29562]    Laboratory Results   Urine Tests    Routine Urinalysis   Color: yellow Appearance: Clear Glucose: negative   (Normal Range: Negative) Bilirubin: negative   (Normal Range: Negative) Ketone: negative   (Normal Range: Negative) Spec. Gravity: 1.025   (Normal Range: 1.003-1.035) Blood: negative   (Normal Range: Negative) pH: 5.0   (Normal Range: 5.0-8.0) Protein: 3+   (Normal Range: Negative) Urobilinogen: 0.2   (Normal Range: 0-1) Nitrite: positive   (Normal Range: Negative) Leukocyte Esterace: negative   (Normal Range: Negative)    Comments: [name]  June 10, 2010 1:54 PM

## 2010-07-03 NOTE — Progress Notes (Signed)
Summary: Call-A-Nurse Report   Call-A-Nurse Triage Call Report Triage Record Num: [id number] Operator: Tomasita Crumble Patient Name: [name] Call Date & Time: 11/30/2009 5:15:04PM Patient Phone: PCP: Nolon Rod. Feliza Diven Patient Gender: Female PCP Fax : Patient DOB: 09-18-1925 Practice Name: [facility] Reason for Call: Ptl calling. States she was hospitalized for N/V/D; discharged 11/23/2009. Reports she is out of BP medicine and another one that she does not know what they are for Hydralazine 25 mg; sig 1 tid in BP medication. She is also out of Prednisone 10 mg 3 tablets daily x 7 days. Dr. Brendolyn Patty wrote the orders at discharge. Dr. Dayton Martes paged per Medication Questions protocol. Caller is poor historian, but she reports no taper dose for prednisone. Dr. Dayton Martes repsonded that no taper dose is needed for Prednisone, and may call in Rx. for Hydralazine 9 tablets; sig 1 tid, Lisinopril 10 mg daily; disp 3 tablets, Bisoprol 5 mg daily disp 3.. [phone] [facility] . Rx. called to pharmacy and caller informed of same. Protocol(s) Used: Medication Question Calls, No Triage (Adults) Recommended Outcome per Protocol: Call Provider Immediately Reason for Outcome: [1] Urgent new prescription or refill (likelihood of harm to patient if not taken) AND [2] triager unable to fill per unit policy Care Advice:  ~ 11/30/2009 5:45:18PM Page 1 of 1 [email or web address]

## 2010-07-03 NOTE — Letter (Signed)
Summary: changed to labetolol----[facility]  [facility]   Imported By: Lanelle Bal 08/30/2009 12:34:41  _____________________________________________________________________  External Attachment:    Type:   Image     Comment:   External Document

## 2010-07-03 NOTE — Assessment & Plan Note (Signed)
Summary: nausea/[name]    History of Present Illness Visit Type: Follow-up Visit Primary GI MD: Lina Sar MD Primary Provider: Birdie Sons MD Requesting Provider: n/a Chief Complaint: Nausea with Epigastric pain History of Present Illness:   VERY NICE 77 YO FEMALE KNOWN TO DR. Juanda Chance WHO WAS DX WITH LYMPHOCYTIC GASTRITIS WHILE HOSPITALIZED WITH ABDOMINAL PAIN, NAUSEA AND DIARRHEA IN 7/11. EGD WITH BX'S PROVED LYMPHOCYTIC GASTRITIS. RANDOM COLON BX WERE ALSO DONE   WITH SIGMOIDOSCOPY AND WERE NEGATIVE. SHE RESPONDED TO STEROIDS AND WAS WEANED OFF WITHIN A MONTH. SHE SAYS  SHE HAD BEEN DOING WELL UNTIL EARLIER THIS WEEK WHEN SHE BEGAN FEELING NAUSEATED, AND HAVING DISCOMFORT IN HER UPPER ABDOMEN. NO FEVER/CHILLS. NO DIARRHEA. NO VOMITING. APPETITE HAS BEEN POORTHIS WEEK.SHE IS TAKING OMEPRAZOLE 20 MG DAILY, AND TRIED SOME  VERY OLD PHENERGAN  WHICH DID HELP BUT MADE HER SLEEPY EVEN WITH A QUARTER OF A TABLET.   GI Review of Systems    Reports abdominal pain, belching, and  nausea.     Location of  Abdominal pain: epigastric area.    Denies acid reflux, chest pain, dysphagia with liquids, dysphagia with solids, heartburn, loss of appetite, vomiting, vomiting blood, weight loss, and  weight gain.      Reports change in bowel habits and  constipation.     Denies anal fissure, black tarry stools, diarrhea, diverticulosis, fecal incontinence, heme positive stool, hemorrhoids, irritable bowel syndrome, jaundice, light color stool, liver problems, rectal bleeding, and  rectal pain.    Current Medications (verified): 1)  Folic Acid 1 Mg Tabs (Folic Acid) .... One P.o. Daily 2)  Alprazolam 0.5 Mg  Tabs (Alprazolam) .Marland Kitchen.. 1 By Mouth Qhs 3)  Vicodin 5-500 Mg Tabs (Hydrocodone-Acetaminophen) .... Take 1/2 Two Times A Day As Needed 4)  Bisoprolol Fumarate 10 Mg Tabs (Bisoprolol Fumarate) .... Take 1 Tablet By Mouth Once A Day 5)  Lisinopril 40 Mg Tabs (Lisinopril) .... One By Mouth Daily 6)   Hydralazine Hcl 25 Mg Tabs (Hydralazine Hcl) .... Take 1 Tablet By Mouth Three Times A Day. Must Get Further Refills From Primary Care Dr! 7)  Omeprazole 20 Mg Cpdr (Omeprazole) .... One By Mouth Daily 8)  Felodipine 5 Mg Xr24h-Tab (Felodipine) .... One By Mouth Once Daily 9)  Miralax  Powd (Polyethylene Glycol 3350) .Marland Kitchen.. 1 Cap Ful At Bedtime  Allergies (verified): 1)  ! Doxycycline Hyclate (Doxycycline Hyclate) 2)  ! Cardura 3)  ! Clonidine Hcl (Clonidine Hcl) 4)  Erythromycin Ethylsuccinate 5)  * Penicillins Group 6)  * Sulfa (Sulfonamides) Group 7)  Demerol (Meperidine Hcl)  Past History:  Past Surgical History: Last updated: 12/13/2009 Hysterectomy Cataract extraction stripping of varicose veins Tubal Ligation hip surgery x 2  Spine surgery status post RFA right renal carcinoma  Social History: Last updated: 06/12/2009 used to live w/ husband at their house until 10-08 Now live by self, lost husband in 08-07-07 1 son  Alcohol Use - no Illicit Drug Use - no still drives has a living will, states wouldn't like CPR or intubation , requested patient  provide a copy (06-15-07)  Past Medical History: pancytopenia, anemia.  Status-post hematology workup, negative (2010)-MYELODYSPLASTIC SYNDROME LYMPHOCYTIC GASTRITIS DX 7/11 Hypertension anxiety-depression Osteoporosis--s/p forteo x 2 years , per nephrology  no need to Rx actonel Hyperlipidemia HIP FRACTURE, Bilateral (2007-2008) . s/p L hip surgery (Dr Lajoyce Corners) DJD: hip pain, s/p local injection w/  Dr Ethelene Hal, was refered to neurosurgery 10-2007 (Dr Yetta Barre) for spinal stenosis , had surgery Colonoscopy  06-2006: hyperplastic polyps h/o  CVA KIDNEY DISEASE, CHRONIC  h/o CARCINOMA, RENAL CELL--> s/p radiofrecuency ablation STRICTURE, ESOPHAGEAL REMOTE GASTRIC ULCER GLAUCOMA NOS   CONGESTIVE HEART FAILURE w/  SYSTOLIC DYSFUNCTION   RAYNAUD'S DISEASE (ICD-443.0) HYPERHOMOCYSTEINEMIA   Family History: [place] Family History of  Breast Cancer:Sister Family History of Diabetes: Brother No FH of Colon Cancer:  Review of Systems       The patient complains of muscle pains/cramps and shortness of breath.  The patient denies allergy/sinus, anemia, anxiety-new, arthritis/joint pain, back pain, blood in urine, breast changes/lumps, change in vision, confusion, cough, coughing up blood, depression-new, fainting, fatigue, fever, headaches-new, hearing problems, heart murmur, heart rhythm changes, itching, menstrual pain, night sweats, nosebleeds, pregnancy symptoms, skin rash, sleeping problems, sore throat, swelling of feet/legs, swollen lymph glands, thirst - excessive , urination - excessive , urination changes/pain, urine leakage, vision changes, and voice change.         SEE HPI  Vital Signs:  Patient profile:   77 year old female Height:      64 inches Weight:      131 pounds BMI:     22.57 BSA:     1.64 Pulse rate:   74 / minute Pulse rhythm:   regular BP sitting:   132 / 62  (left arm)  Vitals Entered By: Merri Ray CMA Duncan Dull) (April 03, 2010 10:44 AM)  Physical Exam  General:  Well developed, well nourished, no acute distress.,THIN,FRAIL APPEARING Head:  Normocephalic and atraumatic. Eyes:  PERRLA, no icterus. Lungs:  Clear throughout to auscultation. Heart:  Regular rate and rhythm; no murmurs, rubs,  or bruits. Abdomen:  SOFT, MILD TENDERNESS EPIGASTRIUM AND MID ABDOMEN, NO GUARDING, NO MASS OR HSM,BS+ Rectal:  NOT DONE Extremities:  No clubbing, cyanosis, edema or deformities noted. Neurologic:  Alert and  oriented x4;  grossly normal neurologically.weakness noted.   Psych:  Alert and cooperative. Normal mood and affect.   Impression & Recommendations:  Problem # 1:  ABDOMINAL PAIN, EPIGASTRIC (ICD-789.06) Assessment Deteriorated 77 YO FEMALE WITH HX OF LYMPHOCYTIC GASTRITIS, STEROID RESPONSIVE 7/11 NOW WITH RECURRENT NAUSEA AND EPIGASTRIC PAIN SIMILAR TO EPISODE IN 7/11.SUSPECT  RE-ACTIVATION OF THE LYMPHOCYTIC GASTRITIS.  CBC,CRP  CONTINUE OMEPRAZOLE 20 MG DAILY IN AM ADD ZOFRAN 4 MG TWICE DAILY AS NEEDED FOR NAUSEA START PREDNISONE 20 MG DAILY IN AM ROV IN 2 WEEKS WITH DR. Juanda Chance OR MYSELF,THEN START STEROID TAPER PT ADVISED TO CALL BACK IN 4-5 DAYS IF SHE IS NOT FEELING ANY BETTER.  Orders: TLB-CRP-High Sensitivity (C-Reactive Protein) (86140-FCRP) TLB-CBC Platelet - w/Differential (85025-CBCD)  Problem # 2:  PERSONAL HX COLONIC POLYPS (ICD-V12.72) Assessment: Comment Only LAST COLON 2008  Problem # 3:  MYELODYSPLASTIC SYNDROME (ICD-238.75) Assessment: Comment Only  Problem # 4:  DIVERTICULOSIS OF COLON (ICD-562.10) Assessment: Comment Only  Problem # 5:  KIDNEY DISEASE, CHRONIC NOS (ICD-585.9) Assessment: Comment Only  Patient Instructions: 1)  Please go to lab, basement level. 2)  Sent prescriptions for Prednisone and Zofran to [facility]. 3)  We made you an appointment with Mike Gip PA for a follow up on 04-22-10. 4)  Appointment card provided. 5)  Copy sent to : Birdie Sons, MD 6)  The medication list was reviewed and reconciled.  All changed / newly prescribed medications were explained.  A complete medication list was provided to the patient / caregiver. Prescriptions: PREDNISONE 20 MG TABS (PREDNISONE) Take 1 daily in the AM for 1 month  #30 x 0   Entered  by:   Lowry Ram NCMA   Authorized by:   Sammuel Cooper PA-c   Signed by:   Lowry Ram NCMA on 04/03/2010   Method used:   Electronically to        [facility]  [phone]* (retail)       [address]       [place], [place]  [address]       Ph: [phone] or [phone]       Fax: [phone]   RxID:   [id number] ONDANSETRON HCL 4 MG TABS (ONDANSETRON HCL) Take 1 tab twice daily as needed for nausea  #60 x 0   Entered by:   Lowry Ram NCMA   Authorized by:   Sammuel Cooper PA-c   Signed by:   Lowry Ram NCMA on  04/03/2010   Method used:   Electronically to        [facility]  [phone]* (retail)       [address]       [place], [place]  [address]       Ph: [phone] or [phone]       Fax: [phone]   RxID:   [id number]

## 2010-07-03 NOTE — Miscellaneous (Signed)
Summary: Face to Face Encounter, Certification and Plan of Care/[facility]    Face to Face Encounter, Certification and Plan of Care/[facility]   Imported By: Maryln Gottron 05/02/2010 12:48:07  _____________________________________________________________________  External Attachment:    Type:   Image     Comment:   External Document

## 2010-07-03 NOTE — Assessment & Plan Note (Signed)
Summary: hematuria/would not come 12/12/2009/dm   Vital Signs:  Patient profile:   77 year old female Height:      64 inches Weight:      133 pounds BMI:     22.91 Temp:     97.5 degrees F oral BP sitting:   130 / 60  (right arm) Cuff size:   regular  Vitals Entered By: Duard Brady LPN (December 13, 2009 2:30 PM) CC: c/o hematuria Is Patient Diabetic? No   Primary Care Provider:  Willow Ora MD  CC:  c/o hematuria.  History of Present Illness: 77 year old patient who has a history of pancytopenia.  Does also been treated in the past for a right renal carcinoma with radiofrequency ablation.  Yesterday she had two episodes of painless gross hematuria that has not recurred.  Today.  She is followed by renal medicine and has been hospitalized recently and there is no history of hematuria in the past.  Today, she feels slightly weak, but no focal complaints.  She is recovering from an episode of acute gout  Preventive Screening-Counseling & Management  Alcohol-Tobacco     Smoking Status: never  Allergies: 1)  ! Doxycycline Hyclate (Doxycycline Hyclate) 2)  ! Cardura 3)  Erythromycin Ethylsuccinate 4)  * Penicillins Group 5)  * Sulfa (Sulfonamides) Group 6)  Demerol (Meperidine Hcl)  Past History:  Past Medical History: Reviewed history from 10/15/2009 and no changes required. pancytopenia, anemia.  Status-post hematology workup, negative (2010)-MYELODYSPLASTIC SYNDROME Hypertension anxiety-depression Osteoporosis--s/p forteo x 2 years , per nephrology  no need to Rx actonel Hyperlipidemia HIP FRACTURE, Bilateral (2007-2008) . s/p L hip surgery (Dr Lajoyce Corners) DJD: hip pain, s/p local injection w/  Dr Ethelene Hal, was refered to neurosurgery 10-2007 (Dr Yetta Barre) for spinal stenosis , had surgery Colonoscopy 06-2006: hyperplastic polyps h/o  CVA KIDNEY DISEASE, CHRONIC  h/o CARCINOMA, RENAL CELL--> s/p radiofrecuency ablation STRICTURE, ESOPHAGEAL REMOTE GASTRIC ULCER GLAUCOMA NOS     CONGESTIVE HEART FAILURE w/  SYSTOLIC DYSFUNCTION   RAYNAUD'S DISEASE (ICD-443.0) HYPERHOMOCYSTEINEMIA   Past Surgical History: Hysterectomy Cataract extraction stripping of varicose veins Tubal Ligation hip surgery x 2  Spine surgery status post RFA right renal carcinoma  Review of Systems       The patient complains of muscle weakness and difficulty walking.  The patient denies anorexia, fever, weight loss, weight gain, vision loss, decreased hearing, hoarseness, chest pain, syncope, dyspnea on exertion, peripheral edema, prolonged cough, headaches, hemoptysis, abdominal pain, melena, hematochezia, severe indigestion/heartburn, hematuria, incontinence, genital sores, suspicious skin lesions, transient blindness, depression, unusual weight change, abnormal bleeding, enlarged lymph nodes, angioedema, and breast masses.    Physical Exam  General:  elderly frail, no distress.  Blood pressure 130/60 Head:  Normocephalic and atraumatic without obvious abnormalities. No apparent alopecia or balding. Mouth:  Oral mucosa and oropharynx without lesions or exudates.  Teeth in good repair. Neck:  No deformities, masses, or tenderness noted. Lungs:  Normal respiratory effort, chest expands symmetrically. Lungs are clear to auscultation, no crackles or wheezes. Heart:  Normal rate and regular rhythm. S1 and S2 normal without gallop, murmur, click, rub or other extra sounds. Abdomen:  Bowel sounds positive,abdomen soft and non-tender without masses, organomegaly or hernias noted.   Impression & Recommendations:  Problem # 1:  HEMATURIA UNSPECIFIED (ICD-599.70)  Orders: UA Dipstick w/o Micro (manual) (46962)  Problem # 2:  ANEMIA-UNSPECIFIED (ICD-285.9)  Her updated medication list for this problem includes:    Folic Acid 1 Mg Tabs (Folic acid) .Marland KitchenMarland KitchenMarland KitchenMarland Kitchen  One p.o. daily  Problem # 3:  PANCYTOPENIA (ICD-284.1)  Her updated medication list for this problem includes:    Folic Acid 1 Mg Tabs  (Folic acid) ..... One p.o. daily  Complete Medication List: 1)  Folic Acid 1 Mg Tabs (Folic acid) .... One p.o. daily 2)  Alprazolam 0.5 Mg Tabs (Alprazolam) .Marland Kitchen.. 1 by mouth qhs 3)  Edecrin 25 Mg Tabs (Ethacrynic acid) .... Two times a day 4)  Vicodin 5-500 Mg Tabs (Hydrocodone-acetaminophen) .... Every morning 5)  Bisoprolol Fumarate 5 Mg Tabs (Bisoprolol fumarate) .... Take 1 tablet by mouth once daily. must get further refills from primary care dr! 6)  Lisinopril 10 Mg Tabs (Lisinopril) .... Take 1 tablet by mouth once a day. must get further refills from primary care dr! 7)  Hydralazine Hcl 25 Mg Tabs (Hydralazine hcl) .... Take 1 tablet by mouth three times a day. must get further refills from primary care dr! 8)  Probenecid 500 Mg Tabs (Probenecid) .... Two times a day 9)  Prednisone 20 Mg Tabs (Prednisone) .... Once daily for 5 days  Patient Instructions: 1)  call if bleeding recurs 2)  follow-up with nephrology as scheduled Prescriptions: HYDRALAZINE HCL 25 MG TABS (HYDRALAZINE HCL) Take 1 tablet by mouth three times a day. MUST GET FURTHER REFILLS FROM PRIMARY CARE DR!  #90 x 4   Entered and Authorized by:   Gordy Savers  MD   Signed by:   Gordy Savers  MD on 12/13/2009   Method used:   Electronically to        [facility]  [phone]* (retail)       [address]       [place], [place]  [address]       Ph: [phone] or [phone]       Fax: [phone]   RxID:   [id number] LISINOPRIL 10 MG TABS (LISINOPRIL) Take 1 tablet by mouth once a day. MUST GET FURTHER REFILLS FROM PRIMARY CARE DR!  #90 x 4   Entered and Authorized by:   Gordy Savers  MD   Signed by:   Gordy Savers  MD on 12/13/2009   Method used:   Electronically to        [facility]  [phone]* (retail)       [address]       [place], [place]  [address]       Ph: [phone] or [phone]       Fax:  [phone]   RxID:   [id number] BISOPROLOL FUMARATE 5 MG TABS (BISOPROLOL FUMARATE) Take 1 tablet by mouth once daily. MUST GET FURTHER REFILLS FROM PRIMARY CARE DR!  #90 x 4   Entered and Authorized by:   Gordy Savers  MD   Signed by:   Gordy Savers  MD on 12/13/2009   Method used:   Electronically to        [facility]  [phone]* (retail)       [address]       [place], [place]  [address]       Ph: [phone] or [phone]       Fax: [phone]   RxID:   [id number]   Laboratory Results   Urine Tests  Date/Time Received: December 13, 2009 2:44 PM  Date/Time Reported:  December 13, 2009 2:44 PM   Routine Urinalysis   Color: yellow Appearance: Clear Glucose: negative   (Normal Range: Negative) Bilirubin: negative   (Normal Range: Negative) Ketone: negative   (Normal Range: Negative) Spec. Gravity: 1.015   (Normal Range: 1.003-1.035) Blood: negative   (Normal Range: Negative) pH: 5.0   (Normal Range: 5.0-8.0) Protein: 100   (Normal Range: Negative) Urobilinogen: 0.2   (Normal Range: 0-1) Nitrite: negative   (Normal Range: Negative) Leukocyte Esterace: negative   (Normal Range: Negative)

## 2010-07-03 NOTE — Letter (Signed)
Summary: [facility]  [facility]   Imported By: Lanelle Bal 04/10/2009 11:57:47  _____________________________________________________________________  External Attachment:    Type:   Image     Comment:   External Document

## 2010-07-03 NOTE — Letter (Signed)
Summary: [facility]  [facility]   Imported By: Maryln Gottron 05/02/2010 13:53:07  _____________________________________________________________________  External Attachment:    Type:   Image     Comment:   External Document

## 2010-07-03 NOTE — Assessment & Plan Note (Signed)
Summary: POST HOSP. F/U-LYMPHOCYTIC GASTRITIS/ON STEROIDS   [name]    History of Present Illness Visit Type: Follow-up Visit Primary GI MD: Lina Sar MD Primary Provider: Willow Ora MD Chief Complaint: Post hospital-lymphocytic gastritis, Pateint is weak and dizzy History of Present Illness:   This is an 77 white female with a recent hospitalization for nausea, vomiting and diarrhea. She is now doing well. 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 This condition needs to be re-evaluated in 2 years. She also had a flexible sigmoidoscopy on 11/01/09 for evaluation of her diarrhea and was found to have moderate to severe diverticulosis. She is being evaluated by Dr.Shadad for myodysplastic syndrome with pancytopenia and for chronic renal insufficiency stage III.   GI Review of Systems    Reports abdominal pain and  bloating.     Location of  Abdominal pain: LUQ.    Denies acid reflux, belching, chest pain, dysphagia with liquids, dysphagia with solids, heartburn, loss of appetite, nausea, vomiting, vomiting blood, weight loss, and  weight gain.        Denies anal fissure, black tarry stools, change in bowel habit, constipation, diarrhea, diverticulosis, fecal incontinence, heme positive stool, hemorrhoids, irritable bowel syndrome, jaundice, light color stool, liver problems, rectal bleeding, and  rectal pain.    Current Medications (verified): 1)  Folic Acid 1 Mg Tabs (Folic Acid) .... One P.o. Daily 2)  Alprazolam 0.5 Mg  Tabs (Alprazolam) .Marland Kitchen.. 1 By Mouth Qhs 3)  Edecrin 25 Mg Tabs (Ethacrynic Acid) .... Two Times A Day 4)  Vicodin 5-500 Mg Tabs (Hydrocodone-Acetaminophen) .... Every Morning 5)  Bisoprolol Fumarate 5 Mg Tabs (Bisoprolol Fumarate) .... Once Daily 6)  Lisinopril 10 Mg Tabs (Lisinopril) .... Once Daily 7)  Hydralazine Hcl 25 Mg Tabs (Hydralazine Hcl) .... Three Times A Day 8)  Prednisone 10 Mg Tabs (Prednisone) .... Three Times A Day For 7  Days 9)  Probenecid 500 Mg Tabs (Probenecid) .... Two Times A Day  Allergies (verified): 1)  ! Doxycycline Hyclate (Doxycycline Hyclate) 2)  ! Cardura 3)  Erythromycin Ethylsuccinate 4)  * Penicillins Group 5)  * Sulfa (Sulfonamides) Group 6)  Demerol (Meperidine Hcl)  Review of Systems       Pertinent positive and negative review of systems were noted in the above HPI. All other ROS was otherwise negative.   Vital Signs:  Patient profile:   77 year old female Height:      63 inches Weight:      133.13 pounds BMI:     23.67 Pulse rate:   80 / minute Pulse rhythm:   regular BP sitting:   182 / 70  (left arm) Cuff size:   regular  Vitals Entered By: June McMurray CMA Duncan Dull) (December 03, 2009 1:54 PM)  Physical Exam  General:  alert, oriented and in no distress. Mouth:  normal mucous membranes. Neck:  Supple; no masses or thyromegaly. Lungs:  Clear throughout to auscultation. Heart:  Regular rate and rhythm; no murmurs, rubs,  or bruits. Abdomen:  soft abdomen with normoactive bowel sounds. No tenderness. No palpable mass. Extremities:  No clubbing, cyanosis, edema or deformities noted. Skin:  multiple ecchymosis from abdominal injections. Psych:  Alert and cooperative. Normal mood and affect.   Impression & Recommendations:  Problem # 1:  PERSONAL HX COLONIC POLYPS (ICD-V12.72) Patient had a recent flexible sigmoidoscopy to rule out microscopic colitis which was negative. She has moderately severe diverticulosis. Due to her age, there  are no plans for a recall colonoscopy at this time.  Problem # 2:  NAUSEA AND VOMITING (ICD-787.01) Patient is status post 3 hospitalizations for nausea and vomiting. She is now doing well with no specific GI symptoms. She is lymphocytic gastritis each year. It is a condition which needs to be rebiopsied in 2 years to rule out dysplasia.  Problem # 3:  ANEMIA, HX OF (ICD-V12.3) as ordered follow up at [facility]. We will check  her CBC today.  Other Orders: TLB-CBC Platelet - w/Differential (85025-[id number])  Patient Instructions: 1)  Dr Ninfa Linden office will be calling you regarding setting up an appointment after both Dr Fabian Sharp and Dr Lesia Hausen have agreed to the switch in providers. 2)  Please go to the basement floor before leaving our office today to have your CBC drawn. 3)  Copy sent to : Dr Drue Novel 4)  The medication list was reviewed and reconciled.  All changed / newly prescribed medications were explained.  A complete medication list was provided to the patient / caregiver.

## 2010-07-03 NOTE — Progress Notes (Signed)
Summary: multiple vicodin rxs  Phone Note From Pharmacy Call back at [phone] option2   Caller: [facility] fred boo,pharmacist Summary of Call:  Holding your Vicodin Rx until you okay. Patient has gotten 3 Rxs Vicodin.  Has filled on 8-8 from Dr. Lajoyce Corners, written 8-2.  #180 two times a day.  Next 8-11 fill, written 8-9 Dr. Lorin Picket or NP #90 One q 4hr.  Dr. [name] 1/2 two times a day #30 one refill.  First 2 were filled.  Holding Dr. Marliss Coots Rx until he okays filling it.  Doctor's return phone # [phone] option 2 Ref # [id number].   Initial call taken by: Rudy Jew, RN,  January 15, 2010 2:20 PM  Follow-up for Phone Call        do not fill my prescription....destroy Follow-up by: Birdie Sons MD,  January 15, 2010 5:28 PM  Additional Follow-up for Phone Call Additional follow up Details #1::        Pharmacy notified. Additional Follow-up by: Lynann Beaver CMA,  January 16, 2010 8:45 AM

## 2010-07-03 NOTE — Miscellaneous (Signed)
Summary: PT Discharge/[facility]  PT Discharge/[facility]   Imported By: Lanelle Bal 07/30/2009 10:58:17  _____________________________________________________________________  External Attachment:    Type:   Image     Comment:   External Document

## 2010-07-03 NOTE — Procedures (Signed)
Summary: Upper Endoscopy  Patient: [name] Note: All result statuses are Final unless otherwise noted.  Tests: (1) Upper Endoscopy (EGD)   EGD Upper Endoscopy       DONE     [facility]     [address]     [place], [place]  [address]           ENDOSCOPY PROCEDURE REPORT           PATIENT:  [name], Denise  MR#:  [id number]     BIRTHDATE:  06-29-1925, 84 yrs. old  GENDER:  female           ENDOSCOPIST:  Hedwig Morton. Juanda Chance, MD     Referred by:  Birdie Sons, M.D.           PROCEDURE DATE:  04/30/2010     PROCEDURE:  EGD with biopsy, 43239     ASA CLASS:  Class III     INDICATIONS:  lymphocytic gastritis 11/2009, respoded to     prednisone, hx of myeloproliferative disease           MEDICATIONS:   Versed 4 mg, Fentanyl 50 mcg     TOPICAL ANESTHETIC:  Exactacain Spray           DESCRIPTION OF PROCEDURE:   After the risks benefits and     alternatives of the procedure were thoroughly explained, informed     consent was obtained.  [facility] [id number] endoscope was     introduced through the mouth and advanced to the second portion of     the duodenum, without limitations.  The instrument was slowly     withdrawn as the mucosa was fully examined.     <<PROCEDUREIMAGES>>           Mild gastritis was found in the antrum. With standard forceps, a     biopsy was obtained and sent to pathology (see image1, image2, and     image4).  A hiatal hernia was found (see image7 and image6). small     reducible hiatal hernia  Otherwise the examination was normal.     With standard forceps, a biopsy was obtained and sent to pathology     (see image5 and image3). duodenal biopsies to r/o lumphocytic     duodenitis    Retroflexed views revealed no abnormalities.    The     scope was then withdrawn from the patient and the procedure     completed.           COMPLICATIONS:  None           ENDOSCOPIC IMPRESSION:     1) Mild gastritis in the antrum     2) Hiatal hernia     3) Otherwise  normal examination     RECOMMENDATIONS:     1) Await biopsy results     continue Prelosec 20 mg po qd,           REPEAT EXAM:  No           ______________________________     Hedwig Morton. Juanda Chance, MD           CC:           n.     eSIGNED:   Hedwig Morton. Brodie at 04/30/2010 11:23 AM           [name], [id number]  Note: An exclamation mark (!) indicates a result that was not dispersed into the flowsheet.  Document Creation Date: 04/30/2010 11:23 AM _______________________________________________________________________  (1) Order result status: Final Collection or observation date-time: 04/30/2010 11:15 Requested date-time:  Receipt date-time:  Reported date-time:  Referring Physician:   Ordering Physician: Lina Sar [phone]) Specimen Source:  Source: Launa Grill Order Number: [id number] Lab site:

## 2010-07-03 NOTE — Miscellaneous (Signed)
Summary: labs from [facility]   Clinical Lists Changes  Observations: Added new observation of ALBUMIN: 4.0 g/dL (69/62/9528 4:13) Added new observation of CALCIUM: 9.3 mg/dL (24/40/1027 2:53) Added new observation of BG RANDOM: 91 mg/dL (66/44/0347 4:25) Added new observation of CREATININE: 1.66 mg/dL (95/63/8756 4:33) Added new observation of BUN: 33 mg/dL (29/51/8841 6:60) Added new observation of CO2 TOTAL: 25 mmol/L (08/22/2009 9:18) Added new observation of CHLORIDE: 108 mmol/L (08/22/2009 9:18) Added new observation of POTASSIUM: 4.7 mmol/L (08/22/2009 9:18) Added new observation of SODIUM: 141 mmol/L (08/22/2009 9:18)      Chemistry Labs Test Date: 08/22/2009                      Value Units        H/L   Reference  Sodium:             141   mmol/L             (137-145) Potassium:          4.7   mmol/L             (3.6-5.0) Chloride:           108   mmol/L             (101-111) CO2:                25    mmol/L             (22-31) BUN:                33    mg/dL         H    (6-30) Creatinine:         1.66  mg/dL         H    (1.6-0.1) Glucose-random:     91    mg/dL              (09-323) Calcium (total):    9.3   mg/dL              (5-57.3) Albumin:            4.0   g/dL               (3-5)  Test ordered by:    [employer]

## 2010-07-03 NOTE — Progress Notes (Signed)
Summary: new med given in hosp  Phone Note Refill Request Message from:  Patient  omeprazole 20 one pill at day/ pt was given med in hosp for acid reflux fax to [facility] [phone] #90 with 3 refills  Initial call taken by: Heron Sabins,  March 26, 2010 10:40 AM  Follow-up for Phone Call        ok #90/3 Follow-up by: Birdie Sons MD,  March 26, 2010 10:47 AM  Additional Follow-up for Phone Call Additional follow up Details #1::        Rx faxed to pharmacy Additional Follow-up by: Alfred Levins, CMA,  March 26, 2010 2:04 PM    New/Updated Medications: OMEPRAZOLE 20 MG CPDR (OMEPRAZOLE) one by mouth daily Prescriptions: OMEPRAZOLE 20 MG CPDR (OMEPRAZOLE) one by mouth daily  #90 x 3   Entered by:   Alfred Levins, CMA   Authorized by:   Birdie Sons MD   Signed by:   Alfred Levins, CMA on 03/26/2010   Method used:   Faxed to ...       [facility] (mail-order)             , [place]         Ph: [phone]       Fax: [phone]   RxID:   [id number]

## 2010-07-03 NOTE — Progress Notes (Signed)
Summary: STAT LAB  Phone Note From Other Clinic   Caller: Lab Summary of Call: wbc:  1.9 Platelet ct:  14,000 Left message on Dr. Marliss Coots cell.  Not an official reading as smear had to be sent out to be read. Initial call taken by: [facility] CMA AAMA,  June 10, 2010 5:07 PM  Follow-up for Phone Call        Per Dr. [name], call pt and notify of low labs, and to go to the ER if has any kind of bleeding.  Will get all of her labs to the Hematologist tomorrow. Follow-up by: Lynann Beaver CMA AAMA,  June 10, 2010 5:13 PM

## 2010-07-03 NOTE — Procedures (Signed)
Summary: Flexible Sigmoidoscopy  Patient: [name] Note: All result statuses are Final unless otherwise noted.  Tests: (1) Flexible Sigmoidoscopy (FLX)  FLX Flexible Sigmoidoscopy                             DONE     [facility]     [address] [place], [place]  [address]           FLEXIBLE SIGMOIDOSCOPY PROCEDURE REPORT           PATIENT:  [name], [name]  MR#:  [id number]     BIRTHDATE:  February 26, 1926, 84 yrs. old  GENDER:  female           ENDOSCOPIST:  Barbette Hair. Arlyce Dice, MD     Referred by:           PROCEDURE DATE:  11/01/2009     PROCEDURE:  Flexible Sigmoidoscopy, diagnostic     ASA CLASS:  Class III     INDICATIONS:  diarrhea           MEDICATIONS:   Fentanyl 50 mcg IV, Versed 3 mg IV           DESCRIPTION OF PROCEDURE:   After the risks benefits and     alternatives of the procedure were thoroughly explained, informed     consent was obtained.  Digital rectal exam was performed and     revealed no abnormalities.   The  endoscope was introduced through     the anus and advanced to the descending colon, without     limitations.  The quality of the prep was .  The instrument was     then slowly withdrawn as the mucosa was fully examined.     <<PROCEDUREIMAGES>>           Moderate diverticulosis was found (see image1, image2, image3, and     image4). No mucosal abnormalities   Retroflexed views in the     rectum revealed no abnormalities.    The scope was then withdrawn     from the patient and the procedure terminated.           COMPLICATIONS:  None           ENDOSCOPIC IMPRESSION:     1) Moderate diverticulosis     RECOMMENDATIONS:     1) continue current meds           REPEAT EXAM:  No           ______________________________     Barbette Hair. Arlyce Dice, MD           CC:  Willow Ora, MD           n.     Rosalie Doctor:   Barbette Hair. Murel Wigle at 11/01/2009 02:33 PM           [name], [id number]  Note: An exclamation mark (!) indicates a result that was not  dispersed into the flowsheet. Document Creation Date: 11/01/2009 2:34 PM _______________________________________________________________________  (1) Order result status: Final Collection or observation date-time: 11/01/2009 14:31 Requested date-time:  Receipt date-time:  Reported date-time:  Referring Physician:   Ordering Physician: Melvia Heaps [phone]) Specimen Source:  Source: Launa Grill Order Number: [id number] Lab site:

## 2010-07-03 NOTE — Progress Notes (Signed)
Summary: Request to switch PCP  Phone Note Call from Patient Call back at Home Phone [phone]   Caller: Patient Summary of Call: Pt would like to switch PCP from Dr. Drue Novel to Dr. Amador Cunas.  Is this ok?      Initial call taken by: Trixie Dredge,  December 03, 2009 5:41 PM  Follow-up for Phone Call        Pt called to check on status of phone note.... Pt adv that she and her husband [name]) saw Dr [name] for years before they started seeing Dr Drue Novel.... Pt adv that [facility] is closer to where she lives and although she still drives, it would be more convenient to have a PCP closer to the area she lives.Marland KitchenMarland KitchenCould she have Dr [name] as her PCP again?.... Pt was advised that request would be passed along....  Debbra Riding, December 04, 2009 11:25AM Follow-up by: Debbra Riding,  December 04, 2009 11:45 AM  Additional Follow-up for Phone Call Additional follow up Details #1::        i'll see her Additional Follow-up by: Birdie Sons MD,  December 04, 2009 5:00 PM    Additional Follow-up for Phone Call Additional follow up Details #2::    Called and spoke with pt to adv her that Dr [name] will accept her as his patient.... Both Dr Drue Novel and Dr [name] mutually agree with switch.... Pt scheduled for appt with Dr [name] on Friday, December 27, 2009 at 10:00am.  Follow-up by: Debbra Riding,  December 05, 2009 8:51 AM

## 2010-07-03 NOTE — Progress Notes (Signed)
----   Converted from flag ---- ---- 07/21/2009 10:46 AM, Denise Quick E. [name] MD wrote: check on patient needs f/u on hyperkalemia after recent admission either here or @ nephrolgy ------------------------------ patient is doing well, had ov with Dr. Briant Cedar last week & he is following up on her K.  She has another f/u appt scheduled with him in a few weeks.  states she is doing ok since d/c from hospital [name]  July 22, 2009 4:11 PM

## 2010-07-03 NOTE — Assessment & Plan Note (Signed)
Summary: NEW PT EST (PT RE-EST) OK PER DR SWORDS - SEE EMR // RS   Vital Signs:  Patient profile:   77 year old female Weight:      133 pounds Temp:     98.1 degrees F oral Pulse rate:   64 / minute Pulse rhythm:   regular BP sitting:   124 / 58  (left arm) Cuff size:   regular  Vitals Entered By: Kern Reap CMA Duncan Dull) (December 27, 2009 10:04 AM) CC: new to establish care  Is Patient Diabetic? No Pain Assessment Patient in pain? no        Primary Care Provider:  Willow Ora MD  CC:  new to establish care .  History of Present Illness: Comes in to reestablish  Follow-Up Visit      This is an 77 year old woman who presents for Follow-up visit.  The patient denies chest pain and palpitations.  Since the last visit the patient notes no new problems or concerns.  The patient reports taking meds as prescribed.  When questioned about possible medication side effects, the patient notes none.    All other systems reviewed and were negative   Preventive Screening-Counseling & Management  Alcohol-Tobacco     Smoking Status: never  Allergies: 1)  ! Doxycycline Hyclate (Doxycycline Hyclate) 2)  ! Cardura 3)  Erythromycin Ethylsuccinate 4)  * Penicillins Group 5)  * Sulfa (Sulfonamides) Group 6)  Demerol (Meperidine Hcl)  Physical Exam  General:  elderly frail, no distress.   Head:  normocephalic and atraumatic.   Ears:  R ear normal and L ear normal.   Neck:  No deformities, masses, or tenderness noted. Lungs:  normal respiratory effort and no intercostal retractions.   Heart:  normal rate and regular rhythm.   Abdomen:  soft and non-tender.   Neurologic:  cranial nerves II-XII intact.  uses a cane for ambulation Skin:  turgor normal and color normal.     Impression & Recommendations:  Problem # 1:  MYELODYSPLASTIC SYNDROME (ICD-238.75)  Problem # 2:  FATIGUE (ICD-780.79)  Orders: TLB-BMP (Basic Metabolic Panel-BMET) (80048-METABOL) TLB-Hepatic/Liver Function Pnl  (80076-HEPATIC) TLB-CBC Platelet - w/Differential (85025-CBCD) TLB-TSH (Thyroid Stimulating Hormone) (84443-TSH) Home Health Referral (Home Health) Venipuncture [phone])  Complete Medication List: 1)  Folic Acid 1 Mg Tabs (Folic acid) .... One p.o. daily 2)  Alprazolam 0.5 Mg Tabs (Alprazolam) .Marland Kitchen.. 1 by mouth qhs 3)  Edecrin 25 Mg Tabs (Ethacrynic acid) .... Two times a day 4)  Vicodin 5-500 Mg Tabs (Hydrocodone-acetaminophen) .... Every morning 5)  Bisoprolol Fumarate 5 Mg Tabs (Bisoprolol fumarate) .... Take 1 tablet by mouth once daily. must get further refills from primary care dr! 6)  Lisinopril 10 Mg Tabs (Lisinopril) .... Take 1 tablet by mouth once a day. must get further refills from primary care dr! 7)  Hydralazine Hcl 25 Mg Tabs (Hydralazine hcl) .... Take 1 tablet by mouth three times a day. must get further refills from primary care dr! 8)  Probenecid 500 Mg Tabs (Probenecid) .... Two times a day 9)  Prednisone 20 Mg Tabs (Prednisone) .... Once daily for 5 days  Patient Instructions: 1)  Please schedule a follow-up appointment in 1 month.  Appended Document: Orders Update     Clinical Lists Changes  Orders: Added new Service order of Specimen Handling ([id number]) - Signed

## 2010-07-03 NOTE — Progress Notes (Signed)
Summary: d/c from [facility]  Phone Note Other Incoming   Summary of Call: Meriam Sprague from [facility] agency calling to let us know that she d/c'd patient after 5 visits per pt request.  Patient does not feel like she needs strength, balance, safety, or gait training Shary Decamp  July 23, 2009 10:50 AM

## 2010-07-03 NOTE — Procedures (Signed)
Summary: Flex Sigmoid / [facility]  Flex Sigmoid / [facility]   Imported By: Lennie Odor 11/26/2009 15:17:32  _____________________________________________________________________  External Attachment:    Type:   Image     Comment:   External Document

## 2010-07-03 NOTE — Progress Notes (Signed)
Summary: clarification on med  Phone Note Refill Request Message from:  Pharm[facility]  Refills Requested: Medication #1:  ALPRAZOLAM 0.5 MG  TABS 1 by mouth qhs pharm needs clarification on electronic signature of medVickey Sages phone [phone] opt #2 ref#[phone] please call by 03-24-2010  Initial call taken by: Heron Sabins,  March 20, 2010 2:37 PM  Follow-up for Phone Call        done. Follow-up by: Lynann Beaver CMA AAMA,  March 20, 2010 3:10 PM

## 2010-07-03 NOTE — Progress Notes (Signed)
Summary: Rx Refills   Phone Note Other Incoming Call back at [phone]   Caller: [name], patient's caregiver Summary of Call: [facility] and have advised him that Dr Juanda Chance has agreed to fill Ms.Smouse's presciptions this time as she is completely out of medications and cannot wait to get an answer regarding switching to another PCP. This will be done with the understanding that this is a one time prescription and that she will need to get further refills from whatever PCP she transfers to. Prescriptions for hydralazine 25 mg three times a day, lisinopril 10 mg daily and bisoprolol 5 mg have been sent to [facility]. Initial call taken by: Lamona Curl CMA (AAMA),  December 04, 2009 1:33 PM    New/Updated Medications: BISOPROLOL FUMARATE 5 MG TABS (BISOPROLOL FUMARATE) Take 1 tablet by mouth once daily. MUST GET FURTHER REFILLS FROM PRIMARY CARE DR! LISINOPRIL 10 MG TABS (LISINOPRIL) Take 1 tablet by mouth once a day. MUST GET FURTHER REFILLS FROM PRIMARY CARE DR! HYDRALAZINE HCL 25 MG TABS (HYDRALAZINE HCL) Take 1 tablet by mouth three times a day. MUST GET FURTHER REFILLS FROM PRIMARY CARE DR! Prescriptions: LISINOPRIL 10 MG TABS (LISINOPRIL) Take 1 tablet by mouth once a day. MUST GET FURTHER REFILLS FROM PRIMARY CARE DR!  #30 x 0   Entered by:   Lamona Curl CMA (AAMA)   Authorized by:   Hart Carwin MD   Signed by:   Lamona Curl CMA (AAMA) on 12/04/2009   Method used:   Electronically to        [facility]  [phone]* (retail)       [address]       [place], [place]  [address]       Ph: [phone] or [phone]       Fax: [phone]   RxID:   [id number] BISOPROLOL FUMARATE 5 MG TABS (BISOPROLOL FUMARATE) Take 1 tablet by mouth once daily. MUST GET FURTHER REFILLS FROM PRIMARY CARE DR!  #30 x 0   Entered by:   Lamona Curl CMA (AAMA)   Authorized by:   Hart Carwin MD   Signed by:    Lamona Curl CMA (AAMA) on 12/04/2009   Method used:   Electronically to        [facility]  [phone]* (retail)       [address]       [place], [place]  [address]       Ph: [phone] or [phone]       Fax: [phone]   RxID:   [id number] HYDRALAZINE HCL 25 MG TABS (HYDRALAZINE HCL) Take 1 tablet by mouth three times a day. MUST GET FURTHER REFILLS FROM PRIMARY CARE DR!  #90 x 0   Entered by:   Lamona Curl CMA (AAMA)   Authorized by:   Hart Carwin MD   Signed by:   Lamona Curl CMA (AAMA) on 12/04/2009   Method used:   Electronically to        [facility]  [phone]* (retail)       [address]       [place], [place]  [address]       Ph: [phone] or [phone]       Fax: [phone]   RxID:   [id number]

## 2010-07-03 NOTE — Progress Notes (Signed)
Summary: refills- see call a nurse report  Phone Note From Other Clinic   Summary of Call: Pt is in the process of swithcing to a PCP at [place] due to it being closer. She needs refills on Lisinopril, Bisoprolol, and Hyrdralize. I do not see that we have ever filled this for her in the past . Pt last saw you 06/2009. Army Fossa CMA  December 03, 2009 3:14 PM   Follow-up for Phone Call         Lisinopril, Bisoprolol, and Hyrdralize were not in the discharge summary from the hospital however they are listed at the GI notes from 7-5/11. Okay to refill one month supply of above medicines.I know she's trying to switch PCP, one way or the other she needs to a seen by me or her new PCP next week Jose E. Paz MD  December 04, 2009 2:45 PM    Additional Follow-up for Phone Call Additional follow up Details #1::        Pt is aware, she states she will call her new doctor to schedule an appt. I told her if she is not able to get in with her new PCP then she needed to call Dr.Paz and see him .Army Fossa CMA  December 04, 2009 3:02 PM

## 2010-07-03 NOTE — Assessment & Plan Note (Signed)
Summary: F/U Nausea   History of Present Illness Visit Type: Follow-up Visit Primary GI MD: Lina Sar MD Primary Provider: Birdie Sons MD Requesting Provider: n/a Chief Complaint: nausea History of Present Illness:   [name] 77 YO FEMALE KNOWN TO DR. Juanda Chance. SHE WAS SEEN ABOUT 2 WEEKS AGO BY MYSELF WITH SXS CONSISTENT WITH A REACTIVATION OF PREVIOUSLY DX LYMPHOCYTIC GASTRITIS. THIS WAS DX ON GASTRIC BX'S IN 7/11, AND PT RESPONDED WELL TO A COURSE OF PREDNISONE.  SHE STARTED HAVING RECURRENT NAUSEA AND EPIGASTRIC PAIN. SHE WAS TREATED WITH PREDNISONE 20 MG DAILY, AND CONTINUED ON HER OMEPRAZOLE DAILY. SHE COMES BACK IN TODAY FOR FOLLOW-UP AND IS FEELING BETTER. SHE HAS NO NAUSEA OR EPIGASTRIC PAIN. APPETITE IS GOOD, WEIGHT STABLE, AND BM'S NORMAL. SHE HAS A UTI CURRENTLY, SO IS EXPERIENCING SOME DYSURIA.   GI Review of Systems    Reports abdominal pain.     Location of  Abdominal pain: lower abdomen.    Denies acid reflux, belching, bloating, chest pain, dysphagia with liquids, dysphagia with solids, heartburn, loss of appetite, nausea, vomiting, vomiting blood, weight loss, and  weight gain.        Denies anal fissure, black tarry stools, change in bowel habit, constipation, diarrhea, diverticulosis, fecal incontinence, heme positive stool, hemorrhoids, irritable bowel syndrome, jaundice, light color stool, liver problems, rectal bleeding, and  rectal pain.   Current Medications (verified): 1)  Folic Acid 1 Mg Tabs (Folic Acid) .... One P.o. Daily 2)  Alprazolam 0.5 Mg  Tabs (Alprazolam) .Marland Kitchen.. 1 By Mouth Qhs 3)  Vicodin 5-500 Mg Tabs (Hydrocodone-Acetaminophen) .... Take 1/2 Two Times A Day As Needed 4)  Bisoprolol Fumarate 10 Mg Tabs (Bisoprolol Fumarate) .... Take 1 Tablet By Mouth Once A Day 5)  Lisinopril 40 Mg Tabs (Lisinopril) .... One By Mouth Daily 6)  Hydralazine Hcl 25 Mg Tabs (Hydralazine Hcl) .... Take 1 Tablet By Mouth Three Times A Day. Must Get Further Refills From  Primary Care Dr! 7)  Omeprazole 20 Mg Cpdr (Omeprazole) .... One By Mouth Daily 8)  Felodipine 5 Mg Xr24h-Tab (Felodipine) .... One By Mouth Once Daily 9)  Miralax  Powd (Polyethylene Glycol 3350) .Marland Kitchen.. 1 Cap Ful At Bedtime 10)  Ondansetron Hcl 4 Mg Tabs (Ondansetron Hcl) .... Take 1 Tab Twice Daily As Needed For Nausea 11)  Prednisone 20 Mg Tabs (Prednisone) .... Take 1 Daily in The Am For 1 Month 12)  Cephalexin 250 Mg  Caps (Cephalexin) .... Take One By Mouth Three Times A Day  Allergies (verified): 1)  ! Doxycycline Hyclate (Doxycycline Hyclate) 2)  ! Cardura 3)  ! Clonidine Hcl (Clonidine Hcl) 4)  Erythromycin Ethylsuccinate 5)  * Penicillins Group 6)  * Sulfa (Sulfonamides) Group 7)  Demerol (Meperidine Hcl)  Past History:  Past Medical History: Reviewed history from 04/03/2010 and no changes required. pancytopenia, anemia.  Status-post hematology workup, negative (2010)-MYELODYSPLASTIC SYNDROME LYMPHOCYTIC GASTRITIS DX 7/11 Hypertension anxiety-depression Osteoporosis--s/p forteo x 2 years , per nephrology  no need to Rx actonel Hyperlipidemia HIP FRACTURE, Bilateral (2007-2008) . s/p L hip surgery (Dr Lajoyce Corners) DJD: hip pain, s/p local injection w/  Dr Ethelene Hal, was refered to neurosurgery 10-2007 (Dr Yetta Barre) for spinal stenosis , had surgery Colonoscopy 06-2006: hyperplastic polyps h/o  CVA KIDNEY DISEASE, CHRONIC  h/o CARCINOMA, RENAL CELL--> s/p radiofrecuency ablation STRICTURE, ESOPHAGEAL REMOTE GASTRIC ULCER GLAUCOMA NOS   CONGESTIVE HEART FAILURE w/  SYSTOLIC DYSFUNCTION   RAYNAUD'S DISEASE (ICD-443.0) HYPERHOMOCYSTEINEMIA   Past Surgical History: Reviewed history from  12/13/2009 and no changes required. Hysterectomy Cataract extraction stripping of varicose veins Tubal Ligation hip surgery x 2  Spine surgery status post RFA right renal carcinoma  Family History: Reviewed history from 04/03/2010 and no changes required. [place] Family History of Breast  Cancer:Sister Family History of Diabetes: Brother No FH of Colon Cancer:  Social History: Reviewed history from 06/12/2009 and no changes required. used to live w/ husband at their house until 10-08 Now live by self, lost husband in 08-07-07 1 son  Alcohol Use - no Illicit Drug Use - no still drives has a living will, states wouldn't like CPR or intubation , requested patient  provide a copy (06-15-07)  Review of Systems  The patient denies allergy/sinus, anemia, anxiety-new, arthritis/joint pain, back pain, blood in urine, breast changes/lumps, change in vision, confusion, cough, coughing up blood, depression-new, fainting, fatigue, fever, headaches-new, hearing problems, heart murmur, heart rhythm changes, itching, menstrual pain, muscle pains/cramps, night sweats, nosebleeds, pregnancy symptoms, shortness of breath, skin rash, sleeping problems, sore throat, swelling of feet/legs, swollen lymph glands, thirst - excessive , urination - excessive , urination changes/pain, urine leakage, vision changes, and voice change.         SEE HPI  Vital Signs:  Patient profile:   77 year old female Height:      64 inches Weight:      133 pounds BMI:     22.91 Pulse rate:   68 / minute Pulse rhythm:   regular BP sitting:   140 / 60  (left arm)  Vitals Entered By: Chales Abrahams CMA Duncan Dull) (April 22, 2010 1:17 PM)  Physical Exam  General:  Well developed, well nourished, no acute distress.,ELDERLY,WELL GROOMED Head:  Normocephalic and atraumatic. Eyes:  PERRLA, no icterus. Lungs:  Clear throughout to auscultation. Heart:  Regular rate and rhythm; no murmurs, rubs,  or bruits. Abdomen:  SOFT, NONTENDER, NO MASS OR HSM,BS+ Rectal:  NOT DONE Extremities:  No clubbing, cyanosis, edema or deformities noted. Neurologic:  Alert and  oriented x4;  grossly normal neurologically. Psych:  Alert and cooperative. Normal mood and affect.  Impression & Recommendations:  Problem # 1:  GASTRITIS  (ICD-535.50) Assessment Improved 77 YO FEMALE WITH HX OF LYMPHOCYTIC GASTRITIS  WITH EXACERBATION OF SXS. SHE IS BETTER AFTER  STARTING PREDNISONE. CONTINUE PREDNISONE, DECREASE TO 10 MG PER DAY. SCHEDULE FOR EGD WITH DR. Juanda Chance  FOR REPEAT BX'S, TO R/O UNDERLYING LYMPHOMA.  PT ALSO HAS A CHRONIC PANCYTOPENIA. CONTINUE PRILOSEC 20 MG DAILY IN AM CONSIDER CROMOLYN IF RETREATMENT NEEDED AFTER PREDNISONE TAPER AND CONDSIDER H. PYLORI AB TESTING. Orders: EGD (EGD)  Problem # 2:  DYSURIA (ICD-788.1) Assessment: Comment Only PT HAS  A UTI, AND IS JUST STARTING KEFLEX  Problem # 3:  CONGESTIVE HEART FAILURE (ICD-428.00) Assessment: Comment Only  Problem # 4:  MYELODYSPLASTIC SYNDROME (ICD-238.75) Assessment: Comment Only  Patient Instructions: 1)  We scheduled the Endoscopy with Dr. Juanda Chance on 04-30-2010. 2)  Directions and brochure provided. 3)  [facility] Patient Information Guide given to patient. 4)  Reduce the Prednisone to 10 MG daily.  5)  Copy sent to : Dr Birdie Sons 6)  The medication list was reviewed and reconciled.  All changed / newly prescribed medications were explained.  A complete medication list was provided to the patient / caregiver.

## 2010-07-04 LAB — TYPE AND SCREEN
ABO/RH(D): O NEG
Antibody Screen: NEGATIVE
Unit division: 0

## 2010-07-07 LAB — CULTURE, BLOOD (ROUTINE X 2): Culture: NO GROWTH

## 2010-07-08 ENCOUNTER — Other Ambulatory Visit: Payer: Self-pay | Admitting: Medical

## 2010-07-08 ENCOUNTER — Telehealth: Payer: Self-pay | Admitting: Emergency Medicine

## 2010-07-08 ENCOUNTER — Encounter (HOSPITAL_BASED_OUTPATIENT_CLINIC_OR_DEPARTMENT_OTHER): Payer: Medicare Other | Admitting: Oncology

## 2010-07-08 DIAGNOSIS — D4620 Refractory anemia with excess of blasts, unspecified: Secondary | ICD-10-CM

## 2010-07-08 DIAGNOSIS — D649 Anemia, unspecified: Secondary | ICD-10-CM

## 2010-07-08 DIAGNOSIS — N289 Disorder of kidney and ureter, unspecified: Secondary | ICD-10-CM

## 2010-07-08 LAB — COMPREHENSIVE METABOLIC PANEL
ALT: 12 U/L (ref 0–35)
AST: 18 U/L (ref 0–37)
CO2: 21 mEq/L (ref 19–32)
Calcium: 9.1 mg/dL (ref 8.4–10.5)
Chloride: 109 mEq/L (ref 96–112)
Potassium: 4.8 mEq/L (ref 3.5–5.3)
Sodium: 139 mEq/L (ref 135–145)
Total Protein: 6 g/dL (ref 6.0–8.3)

## 2010-07-08 LAB — CBC WITH DIFFERENTIAL/PLATELET
Eosinophils Absolute: 0 10*3/uL (ref 0.0–0.5)
MONO#: 0.1 10*3/uL (ref 0.1–0.9)
NEUT#: 0.5 10*3/uL — ABNORMAL LOW (ref 1.5–6.5)
Platelets: 16 10*3/uL — ABNORMAL LOW (ref 145–400)
RBC: 2.53 10*6/uL — ABNORMAL LOW (ref 3.70–5.45)
RDW: 23.9 % — ABNORMAL HIGH (ref 11.2–14.5)
WBC: 1.9 10*3/uL — ABNORMAL LOW (ref 3.9–10.3)
lymph#: 1.3 10*3/uL (ref 0.9–3.3)

## 2010-07-08 LAB — HOLD TUBE, BLOOD BANK

## 2010-07-08 NOTE — Discharge Summary (Signed)
[name], [name]                  ACCOUNT NO.:  [id number]  MEDICAL RECORD NO.:  [id number]          PATIENT TYPE:  INP  LOCATION:  1307                         FACILITY:  [facility]  PHYSICIAN:  Rose Phi. Myna Hidalgo, M.D. DATE OF BIRTH:  08-02-1925  DATE OF ADMISSION:  06/30/2010 DATE OF DISCHARGE:                              DISCHARGE SUMMARY   DISCHARGE DIAGNOSES: 1. Citrobacter urinary tract infection. 2. Myelodysplasia with marked pancytopenia 3. Status post platelet transfusion and blood transfusion. 4. Renal cell carcinoma, bilaterally. 5. Transabdominal pain. 6. Chronic renal insufficiency. 7. Hypertension.  CONDITION ON DISCHARGE:  Stable.  ACTIVITY:  As tolerated.  DIET:  No restrictions.  FOLLOWUP:  The patient will follow up with Dr. Clelia Croft or Eunice Blase probably in about 5 days.  DISCHARGE MEDICATIONS: 1. Levaquin 250 mg p.o. daily x7 days. 2. Xanax 0.5 mg q.h.s. 3. Bisoprolol 10 mg p.o. b.i.d. 4. Folic acid 1 mg p.o. daily. 5. Lasix 40 mg p.o. daily. 6. Hydralazine 25 mg daily. 7. Vicodin (5/325) 1 p.o. every 8 hours p.r.n. 8. Lisinopril 40 mg p.o. daily. 9. Prilosec 20 mg p.o. daily. 10.Tylenol 325 mg p.o. q.h.s.  HOSPITAL COURSE:  [name] was admitted on the 30th because of fever. She recently had a left renal mass.  She underwent a procedure for this. As far as I suspect, this was radiofrequency ablation.  She had temperature of 103.  She was admitted.  She was started on IV antibiotics with Primaxin.  Primaxin was dosed according to the renal insufficiency.  Her BUN and creatinine were 49 and 2.0.  Her CBC on admission showed white cell count 1.2, hemoglobin 7.6, hematocrit 22.8, platelet count was 10,000.  She did receive a unit of platelets during her hospital stay.  She also received a unit of packed red blood cells. This improved her overall condition.  The urine grew out Citrobacter. This was sensitive to Levaquin, gentamicin, ciprofloxacin, and  Rocephin. It also was susceptible to Primaxin.  She was afebrile during her hospital stay.  Her vital signs remained stable, although her blood pressure was up a little bit.  She continued on her antihypertensives.  The patient was complaining of some left- sided abdominal pain.  I was worried about her possibly having bleed after the procedure.  There was some "stranding" about the kidney, which could represent some hemorrhage.  This was not any large enough.  She did have the left inferior pole renal lesion measuring 2.9 x 3 cm. There was noted to be a 2-cm right inferior pancreatic lesion.  Again, this may be hepatic metastatic disease.  She had a very small pleural effusion.  She did not have any IV contrast.  She had oral contrast. The abdominal pain improved.  Her appetite was okay.  She has never been a big eater from what she tells me.  Her CBC on the day of discharge showed a white cell count of 1.6, hemoglobin 8.5, hematocrit 24.4, platelet count 12,000.  She really wanted to go home.  I felt that as she was stable with improving blood counts and a bacteria that  was sensitive to oral antibiotics, we could get her home.  I feel that she probably would do better at home.  PHYSICAL EXAMINATION:  VITAL SIGNS:  Her vital signs on the day of discharge show temperature of 97.6, pulse 65, respiratory rate 18, blood pressure 170/70, oxygen saturation on room air was 97%. HEAD AND NECK:  Exam showed normocephalic, atraumatic skull.  There was no ocular or oral lesions.  She had no cervical or supraclavicular lymph nodes. LUNGS:  Clear to percussion and auscultation bilaterally.  She did not have any rales, wheezes or rhonchi. CARDIAC:  Regular rate and rhythm with normal S1, S2.  There are no murmurs, rubs or bruits. ABDOMEN:  Soft with good bowel sounds.  There was no guarding or rebound tenderness.  There was no abdominal mass.  There was no fluid wave. There was no palpable  hepatosplenomegaly. EXTREMITIES:  No clubbing, cyanosis or edema. NEUROLOGICAL:  No focal neurological deficits. SKIN:  No rash, ecchymosis or petechiae.     Rose Phi. Myna Hidalgo, M.D.     PRE/MEDQ  D:  07/03/2010  T:  07/03/2010  Job:  [id number]  cc:   Blenda Nicely. [email or web address] Fax: [phone]  Electronically Signed by Arlan Organ  on 07/08/2010 07:01:37 AM

## 2010-07-09 ENCOUNTER — Other Ambulatory Visit (HOSPITAL_COMMUNITY): Payer: Self-pay | Admitting: Anesthesiology

## 2010-07-09 NOTE — Letter (Signed)
Summary: [facility]  [facility]   Imported By: Maryln Gottron 07/01/2010 13:27:11  _____________________________________________________________________  External Attachment:    Type:   Image     Comment:   External Document

## 2010-07-16 ENCOUNTER — Encounter (HOSPITAL_BASED_OUTPATIENT_CLINIC_OR_DEPARTMENT_OTHER): Payer: Medicare Other | Admitting: Oncology

## 2010-07-16 ENCOUNTER — Other Ambulatory Visit: Payer: Self-pay | Admitting: Oncology

## 2010-07-16 DIAGNOSIS — D4620 Refractory anemia with excess of blasts, unspecified: Secondary | ICD-10-CM

## 2010-07-16 LAB — CBC WITH DIFFERENTIAL/PLATELET
BASO%: 0.4 % (ref 0.0–2.0)
EOS%: 0.8 % (ref 0.0–7.0)
LYMPH%: 76.7 % — ABNORMAL HIGH (ref 14.0–49.7)
MCH: 34 pg (ref 25.1–34.0)
MCHC: 33.3 g/dL (ref 31.5–36.0)
MONO#: 0 10*3/uL — ABNORMAL LOW (ref 0.1–0.9)
RBC: 2.41 10*6/uL — ABNORMAL LOW (ref 3.70–5.45)
WBC: 2.6 10*3/uL — ABNORMAL LOW (ref 3.9–10.3)
lymph#: 2 10*3/uL (ref 0.9–3.3)
nRBC: 0 % (ref 0–0)

## 2010-07-21 NOTE — H&P (Addendum)
  NAMEJOANNE, SALAH                  ACCOUNT NO.:  [id number]  MEDICAL RECORD NO.:  [id number]          PATIENT TYPE:  INP  LOCATION:  1307                         FACILITY:  [facility]  PHYSICIAN:  Valentino Hue. Graelyn Bihl, M.D.DATE OF BIRTH:  09-Oct-1925  DATE OF ADMISSION:  06/30/2010 DATE OF DISCHARGE:                             HISTORY & PHYSICAL   Carry Weesner is an 77 year old [place] woman, followed by Dr. Clelia Croft, with a diagnosis of renal cell carcinoma.  DICTATION ENDED AT THIS POINT.     Valentino Hue. Wei Poplaski, M.D.     Ronna Polio  D:  06/30/2010  T:  06/30/2010  Job:  [id number]  Electronically Signed by Ruthann Cancer M.D. on 07/21/2010 01:21:09 PM

## 2010-07-21 NOTE — H&P (Addendum)
[name], [name]                  ACCOUNT NO.:  [id number]  MEDICAL RECORD NO.:  [id number]          PATIENT TYPE:  INP  LOCATION:  1307                         FACILITY:  [facility]  PHYSICIAN:  Valentino Hue. Magrinat, M.D.DATE OF BIRTH:  May 22, 1926  DATE OF ADMISSION:  06/30/2010 DATE OF DISCHARGE:                             HISTORY & PHYSICAL   HISTORY OF PRESENT ILLNESS:  [name] is an 77 year old [place] woman followed by Dr. Clelia Croft for history of a renal cell carcinoma. This was actually never biopsied but the patient had a right-sided radiofrequency ablation in November 2006.  More recently, there has been some evidence of growth of left renal mass and the patient was set up for biopsy of this mass today.  She also has a diagnosis of myelodysplasia and today in the office, her platelet count was 16,000 which is her usual count.  Accordingly, she received 2 packed platelet units prior to her procedure.  The patient seems to have undergone the procedure well but after words she developed rigors and a temperature up to 103 degrees.  It is not clear whether this was actually related to the platelet transfusion or whether this was a separate event.  At any rate, with a total neutrophil count today of 600, we are admitting the patient for evaluation of fever and neutropenia.  PAST MEDICAL HISTORY:  Otherwise significant for: 1. Bronchiectasis. 2. Hypertension. 3. Gout. 4. Renal insufficiency. 5. Anemia of renal insufficiency, on Procrit every 2 weeks. 6. History of spinal stenosis. 7. History of back surgery and also history of left hip fracture,     status post intramedullary fixation. 8. She is status post hysterectomy. 9. She has a history of osteoporosis.  ALLERGIES:  SHE IS ALLERGIC TO MEPERIDINE AND PENICILLIN AND ALSO TO "THE MYCINS."  MEDICATIONS:  Her current medications include: 1. Alprazolam 0.5 mg nightly. 2. Bisoprolol fumarate 10 mg twice daily. 3. Folic  acid 1 mg daily. 4. Lasix 40 mg daily. 5. Hydralazine 25 mg daily. 6. Hydrocodone APAP 5/500 as needed. 7. Lisinopril 40 mg daily.  REVIEW OF SYSTEMS:  The patient tells me she had a very mild headache today.  She had no visual changes and denies problems with nausea, although she vomited x1 one today in day surgery area, after the procedure, possibly secondary to narcotics.  At any rate, she had a normal bowel movement this morning.  She denies dysuria or hematuria but she says she has frequent bladder infections.  She denies cough, phlegm production, pleurisy or chest pain or pressure.  There has been no difficulty swallowing or pain on swallowing.  PHYSICAL EXAMINATION:  VITAL SIGNS:  Tonight on the floor, her temperature is 103 degrees, her respiratory rate 20, her pulse 65 and her blood pressure 183/73. HEENT:  Sclerae are not icteric.  Pupils are equal and reactive and the oropharynx is clear.  I do not palpate any peripheral adenopathy. LUNGS:  Show no rales or rhonchi and excursion is fair bilaterally. HEART:  Regular rate and rhythm with a midsystolic murmur. ABDOMEN:  Soft, nontender.  Bowel sounds positive. MUSCULOSKELETAL:  No peripheral edema.  No focal spinal tenderness. NEUROLOGIC:  Nonfocal.  LABORATORY DATA:  Lab work this morning in the office showed a white cell count of 2.3, ANC 600, hemoglobin 9 and platelets 16,000.  These labs are stable.  Her sodium was 145, potassium 5.4, chloride 117, CO2 23, glucose 108, BUN 49 and creatinine 2.12, calcium was 9.6.  Films, her most recent CT scan showed the enlarging left renal mass and a possible new liver lesion.  IMPRESSION AND PLAN:  A 77 year old [place] woman with a history of renal cell carcinoma, renal insufficiency and myelodysplasia, status post biopsy of renal mass today, status post platelet transfusion, with rigors and a temperature in excess of 103 degrees, in the setting of neutropenia.  We are  admitting the patient for panculture and starting Primaxin. Given her renal insufficiency, she will only receive this on a t.i.d. basis.  I am concerned she may be developing sepsis, although her blood pressure right now is high and therefore I am holding her antihypertensives overnight.  If her pressure remains normal tomorrow, of course we will resume those.  The patient has had DNRs written previously.  Tonight, she wished to be a full code and I think if she does have sepsis, this is ought to be reversible, so I am in agreement with that decision.     Valentino Hue. Magrinat, M.D.     Ronna Polio  D:  06/30/2010  T:  06/30/2010  Job:  [id number]  cc:   Blenda Nicely. Shadad Fax: [phone]  Bruce H. Swords, MD [address] [place] [address]  [name], M.D. Fax: [phone]  Electronically Signed by Ruthann Cancer M.D. on 07/21/2010 01:20:56 PM

## 2010-07-22 ENCOUNTER — Ambulatory Visit (INDEPENDENT_AMBULATORY_CARE_PROVIDER_SITE_OTHER): Payer: Medicare Other | Admitting: Family Medicine

## 2010-07-22 ENCOUNTER — Telehealth: Payer: Self-pay | Admitting: *Deleted

## 2010-07-22 ENCOUNTER — Encounter: Payer: Self-pay | Admitting: Family Medicine

## 2010-07-22 ENCOUNTER — Ambulatory Visit: Payer: Medicare Other | Admitting: Internal Medicine

## 2010-07-22 VITALS — BP 170/68 | Temp 97.8°F | Ht 64.25 in | Wt 128.0 lb

## 2010-07-22 DIAGNOSIS — D469 Myelodysplastic syndrome, unspecified: Secondary | ICD-10-CM

## 2010-07-22 DIAGNOSIS — R300 Dysuria: Secondary | ICD-10-CM

## 2010-07-22 DIAGNOSIS — N390 Urinary tract infection, site not specified: Secondary | ICD-10-CM

## 2010-07-22 DIAGNOSIS — M109 Gout, unspecified: Secondary | ICD-10-CM

## 2010-07-22 LAB — POCT URINALYSIS DIPSTICK
Bilirubin, UA: NEGATIVE
Nitrite, UA: NEGATIVE
Urobilinogen, UA: 0.02
pH, UA: 7

## 2010-07-22 MED ORDER — LEVOFLOXACIN 250 MG PO TABS: 250.0000 mg | ORAL_TABLET | Freq: Every day | ORAL | Status: AC

## 2010-07-22 MED ORDER — PREDNISONE 10 MG PO TABS: 10.0000 mg | ORAL_TABLET | Freq: Every day | ORAL | Status: DC

## 2010-07-22 NOTE — Telephone Encounter (Signed)
Pt will see Dr. Caryl Never today later when she has a ride.

## 2010-07-22 NOTE — Telephone Encounter (Signed)
Pt cannot keep her appt today due to her gout. She cannot drive with her gout, and she has a UTI .  Would like to be treated for both.

## 2010-07-22 NOTE — Progress Notes (Signed)
  Subjective:    Patient ID: [name], female    DOB: [birth year], 77 y.o.   MRN: [id number]  HPI    Review of Systems  Constitutional: Positive for fatigue. Negative for fever, chills and appetite change.  Respiratory: Negative for cough and shortness of breath.   Cardiovascular: Negative for chest pain, palpitations and leg swelling.  Gastrointestinal: Negative for nausea, vomiting, abdominal pain and blood in stool.  Genitourinary: Positive for dysuria and urgency. Negative for hematuria.  Hematological: Negative for adenopathy.       Objective:   Physical Exam        Assessment & Plan:

## 2010-07-22 NOTE — Patient Instructions (Signed)
Follow up promptly for any fever or worsening symptoms 

## 2010-07-22 NOTE — Progress Notes (Signed)
  Subjective:    Patient ID: [name], female    DOB: 17-Dec-1925, 77 y.o.   MRN: [id number]  HPI  Patient has multiple chronic problems including history of myelodysplastic syndrome, renal cell carcinoma, gout, hyperlipidemia, hypertension, history of congestive heart failure , history of stroke , and osteoporosis. She is seen today for the following:   one-day history of urine frequency and burning. Denies any fever or chills. No vomiting. No acute back pain. She gives history of recent renal biopsy February 1 subsequently treated with 7 days of Levaquin and symptoms resolved.    history of gout. She gives a few day history of right metatarsophalangeal joint pain and warmth and redness. No injury. Has taken prednisone in the past for flareups. Pain with ambulation and occ at rest.   she has history of pancytopenia with recent CBC platelets 17,000, white blood count 2.6 thousand and hemoglobin 8.2. Followed closely by hematology.  multiple infusions in the past with platelets   Review of Systems     Objective:   Physical Exam  patient is alert nontoxic in appearance. Somewhat pale. Afebrile Oropharynx is moist and clear Chest clear to auscultation Heart regular rhythm and rate  back no CVA tenderness Extremities right foot redness mild warmth and tenderness metatarsophalangeal joint. No skin changes Alert and oriented times three.       Assessment & Plan:   #1 gout. Brief prednisone taper. Patient not a candidate for Advil or any other anti-inflammatory medications secondary to low platelets. #2 acute cystitis Levaquin 250 mg daily for 7 days. Urine culture sent. #3 myelodysplastic syndrome with pancytopenia.

## 2010-07-24 LAB — URINE CULTURE: Colony Count: >=100000

## 2010-07-24 NOTE — Progress Notes (Signed)
Quick Note:  Attempt to call - no ans no mach - will try tomorrow ______

## 2010-07-28 ENCOUNTER — Ambulatory Visit (INDEPENDENT_AMBULATORY_CARE_PROVIDER_SITE_OTHER): Payer: Medicare Other | Admitting: Family Medicine

## 2010-07-28 ENCOUNTER — Encounter: Payer: Self-pay | Admitting: Family Medicine

## 2010-07-28 VITALS — BP 140/70 | Temp 98.6°F | Ht 64.5 in | Wt 128.0 lb

## 2010-07-28 DIAGNOSIS — M109 Gout, unspecified: Secondary | ICD-10-CM

## 2010-07-28 DIAGNOSIS — H6120 Impacted cerumen, unspecified ear: Secondary | ICD-10-CM

## 2010-07-28 DIAGNOSIS — N390 Urinary tract infection, site not specified: Secondary | ICD-10-CM

## 2010-07-28 LAB — POCT URINALYSIS DIPSTICK
Ketones, UA: NEGATIVE
Spec Grav, UA: 1.015
pH, UA: 6

## 2010-07-28 NOTE — Progress Notes (Signed)
  Subjective:    Patient ID: [name], female    DOB: November 28, 1925, 77 y.o.   MRN: [id number]  HPI  Patient seen for followup. Recent possible gout flareup right foot. Some improvement with prednisone but still has some pain with walking. Less warmth. Overall about 60% improved.    complains of some trouble hearing left ear. Sensation of fullness left ear. No vertigo.    recent UTI with Proteus mirabilis. Resistant to Levaquin. Apparently patient never contacted to consider switching antibiotics. Symptoms are resolved at this time. No burning with urination. No fevers or chills. Denies any nausea or vomiting. Does have poor appetite.   Review of Systems  denies any headaches , chest pain, dyspnea , abdominal pain , vertigo , falls , fever or , chills.  Has occasional lightheadedness. No syncope    Objective:   Physical Exam  patient is alert and nontoxic in appearance. Oropharynx is moist and clear  left canal is impacted with cerumen. Right canal is normal  Neck is supple with no adenopathy Chest clear to auscultation Heart regular rhythm and rate Right foot reveals no visible swelling. No warmth. Minimally tender metatarsophalangeal joint.       Assessment & Plan:   #1 recent gout right foot improved #2 cerumen impaction left canal  #3 recent urinary tract infection resistant to Levaquin. Repeat UA today

## 2010-07-30 ENCOUNTER — Other Ambulatory Visit: Payer: Self-pay | Admitting: Medical

## 2010-07-30 ENCOUNTER — Encounter (HOSPITAL_BASED_OUTPATIENT_CLINIC_OR_DEPARTMENT_OTHER): Payer: Medicare Other | Admitting: Oncology

## 2010-07-30 DIAGNOSIS — D4620 Refractory anemia with excess of blasts, unspecified: Secondary | ICD-10-CM

## 2010-07-30 DIAGNOSIS — N289 Disorder of kidney and ureter, unspecified: Secondary | ICD-10-CM

## 2010-07-30 DIAGNOSIS — D649 Anemia, unspecified: Secondary | ICD-10-CM

## 2010-07-30 LAB — CBC WITH DIFFERENTIAL/PLATELET
Basophils Absolute: 0 10*3/uL (ref 0.0–0.1)
Eosinophils Absolute: 0 10*3/uL (ref 0.0–0.5)
HGB: 8.2 g/dL — ABNORMAL LOW (ref 11.6–15.9)
MCV: 108.2 fL — ABNORMAL HIGH (ref 79.5–101.0)
MONO#: 0 10*3/uL — ABNORMAL LOW (ref 0.1–0.9)
NEUT#: 1.1 10*3/uL — ABNORMAL LOW (ref 1.5–6.5)
RBC: 2.22 10*6/uL — ABNORMAL LOW (ref 3.70–5.45)
RDW: 24.8 % — ABNORMAL HIGH (ref 11.2–14.5)
WBC: 2.1 10*3/uL — ABNORMAL LOW (ref 3.9–10.3)
lymph#: 0.9 10*3/uL (ref 0.9–3.3)

## 2010-07-30 LAB — HOLD TUBE, BLOOD BANK

## 2010-07-31 ENCOUNTER — Encounter (HOSPITAL_COMMUNITY)
Admission: RE | Admit: 2010-07-31 | Discharge: 2010-07-31 | Disposition: A | Discharge status: Home / Self Care | Payer: Medicare Other | Source: Ambulatory Visit | Attending: Oncology | Admitting: Oncology

## 2010-07-31 DIAGNOSIS — D649 Anemia, unspecified: Secondary | ICD-10-CM | POA: Insufficient documentation

## 2010-07-31 LAB — TYPE & CROSSMATCH - CHCC

## 2010-07-31 MED ORDER — CEPHALEXIN 500 MG PO CAPS: 500.0000 mg | ORAL_CAPSULE | Freq: Two times a day (BID) | ORAL | Status: DC

## 2010-07-31 NOTE — Progress Notes (Signed)
Addended by: Sid Falcon on: 07/31/2010 06:06 PM   Modules accepted: Orders

## 2010-08-01 ENCOUNTER — Encounter (HOSPITAL_BASED_OUTPATIENT_CLINIC_OR_DEPARTMENT_OTHER): Payer: Medicare Other | Admitting: Oncology

## 2010-08-01 ENCOUNTER — Other Ambulatory Visit: Payer: Self-pay | Admitting: *Deleted

## 2010-08-01 DIAGNOSIS — R300 Dysuria: Secondary | ICD-10-CM

## 2010-08-01 DIAGNOSIS — D4620 Refractory anemia with excess of blasts, unspecified: Secondary | ICD-10-CM

## 2010-08-01 MED ORDER — CEPHALEXIN 500 MG PO CAPS: 500.0000 mg | ORAL_CAPSULE | Freq: Two times a day (BID) | ORAL | Status: AC

## 2010-08-01 NOTE — Telephone Encounter (Signed)
Pt called and reports her pharmacy said they never received Rx, I called the Keflex in this time, pt informed

## 2010-08-02 LAB — CROSSMATCH
ABO/RH(D): O NEG
Antibody Screen: NEGATIVE
Unit division: 0

## 2010-08-06 ENCOUNTER — Telehealth: Payer: Self-pay | Admitting: Internal Medicine

## 2010-08-06 NOTE — Telephone Encounter (Signed)
REFILL ON HYDROCODONE 5-500.... [facility] MAIL ORDER.

## 2010-08-07 MED ORDER — HYDROCODONE-ACETAMINOPHEN 5-500 MG PO TABS: ORAL_TABLET | ORAL | Status: DC

## 2010-08-07 NOTE — Telephone Encounter (Signed)
Faxed rx to pharmacy  

## 2010-08-11 LAB — CROSSMATCH
ABO/RH(D): O NEG
Antibody Screen: NEGATIVE
Unit division: 0

## 2010-08-11 LAB — PREPARE PLATELETS: Unit division: 0

## 2010-08-14 ENCOUNTER — Encounter (HOSPITAL_BASED_OUTPATIENT_CLINIC_OR_DEPARTMENT_OTHER): Payer: Medicare Other | Admitting: Oncology

## 2010-08-14 ENCOUNTER — Other Ambulatory Visit: Payer: Self-pay | Admitting: Oncology

## 2010-08-14 DIAGNOSIS — D4620 Refractory anemia with excess of blasts, unspecified: Secondary | ICD-10-CM

## 2010-08-14 DIAGNOSIS — D649 Anemia, unspecified: Secondary | ICD-10-CM

## 2010-08-14 LAB — CBC WITH DIFFERENTIAL/PLATELET
Basophils Absolute: 0 10*3/uL (ref 0.0–0.1)
EOS%: 2 % (ref 0.0–7.0)
Eosinophils Absolute: 0.1 10*3/uL (ref 0.0–0.5)
HGB: 9.8 g/dL — ABNORMAL LOW (ref 11.6–15.9)
LYMPH%: 66 % — ABNORMAL HIGH (ref 14.0–49.7)
MCH: 34.4 pg — ABNORMAL HIGH (ref 25.1–34.0)
MCV: 104.6 fL — ABNORMAL HIGH (ref 79.5–101.0)
MONO%: 1.2 % (ref 0.0–14.0)
NEUT#: 0.8 10*3/uL — ABNORMAL LOW (ref 1.5–6.5)
Platelets: 14 10*3/uL — ABNORMAL LOW (ref 145–400)
RBC: 2.85 10*6/uL — ABNORMAL LOW (ref 3.70–5.45)

## 2010-08-14 LAB — CROSSMATCH

## 2010-08-15 LAB — CROSSMATCH

## 2010-08-16 LAB — CROSSMATCH: Antibody Screen: NEGATIVE

## 2010-08-17 LAB — DIFFERENTIAL
Basophils Absolute: 0 10*3/uL (ref 0.0–0.1)
Lymphs Abs: 1 10*3/uL (ref 0.7–4.0)
Monocytes Absolute: 0.1 10*3/uL (ref 0.1–1.0)

## 2010-08-17 LAB — CBC
HCT: 25.3 % — ABNORMAL LOW (ref 36.0–46.0)
MCHC: 34.7 g/dL (ref 30.0–36.0)
MCV: 113.7 fL — ABNORMAL HIGH (ref 78.0–100.0)
Platelets: 39 10*3/uL — ABNORMAL LOW (ref 150–400)
RDW: 15.9 % — ABNORMAL HIGH (ref 11.5–15.5)

## 2010-08-17 LAB — BASIC METABOLIC PANEL
BUN: 36 mg/dL — ABNORMAL HIGH (ref 6–23)
BUN: 38 mg/dL — ABNORMAL HIGH (ref 6–23)
CO2: 28 mEq/L (ref 19–32)
Chloride: 108 mEq/L (ref 96–112)
Chloride: 108 mEq/L (ref 96–112)
Glucose, Bld: 115 mg/dL — ABNORMAL HIGH (ref 70–99)
Glucose, Bld: 83 mg/dL (ref 70–99)
Potassium: 4.7 mEq/L (ref 3.5–5.1)
Potassium: 4.9 mEq/L (ref 3.5–5.1)
Sodium: 143 mEq/L (ref 135–145)

## 2010-08-17 LAB — CROSSMATCH
ABO/RH(D): O NEG
Antibody Screen: NEGATIVE

## 2010-08-17 LAB — URINE CULTURE

## 2010-08-17 LAB — URINALYSIS, ROUTINE W REFLEX MICROSCOPIC
Bilirubin Urine: NEGATIVE
Ketones, ur: NEGATIVE mg/dL
Nitrite: POSITIVE — AB
pH: 6 (ref 5.0–8.0)

## 2010-08-17 LAB — SAMPLE TO BLOOD BANK

## 2010-08-17 LAB — URINE MICROSCOPIC-ADD ON

## 2010-08-18 ENCOUNTER — Ambulatory Visit (INDEPENDENT_AMBULATORY_CARE_PROVIDER_SITE_OTHER): Payer: Medicare Other | Admitting: Internal Medicine

## 2010-08-18 VITALS — BP 116/66

## 2010-08-18 DIAGNOSIS — R300 Dysuria: Secondary | ICD-10-CM

## 2010-08-18 DIAGNOSIS — N3000 Acute cystitis without hematuria: Secondary | ICD-10-CM

## 2010-08-18 LAB — DIFFERENTIAL
Basophils Absolute: 0 10*3/uL (ref 0.0–0.1)
Basophils Absolute: 0 10*3/uL (ref 0.0–0.1)
Basophils Relative: 0 % (ref 0–1)
Basophils Relative: 0 % (ref 0–1)
Basophils Relative: 0 % (ref 0–1)
Basophils Relative: 0 % (ref 0–1)
Eosinophils Absolute: 0.1 10*3/uL (ref 0.0–0.7)
Eosinophils Absolute: 0.1 10*3/uL (ref 0.0–0.7)
Eosinophils Relative: 0 % (ref 0–5)
Eosinophils Relative: 8 % — ABNORMAL HIGH (ref 0–5)
Eosinophils Relative: 8 % — ABNORMAL HIGH (ref 0–5)
Eosinophils Relative: 9 % — ABNORMAL HIGH (ref 0–5)
Eosinophils Relative: 6 % — ABNORMAL HIGH (ref 0–5)
Eosinophils Relative: 6 % — ABNORMAL HIGH (ref 0–5)
Lymphocytes Relative: 47 % — ABNORMAL HIGH (ref 12–46)
Lymphocytes Relative: 50 % — ABNORMAL HIGH (ref 12–46)
Lymphocytes Relative: 52 % — ABNORMAL HIGH (ref 12–46)
Lymphocytes Relative: 54 % — ABNORMAL HIGH (ref 12–46)
Lymphocytes Relative: 63 % — ABNORMAL HIGH (ref 12–46)
Lymphs Abs: 0.3 10*3/uL — ABNORMAL LOW (ref 0.7–4.0)
Lymphs Abs: 0.3 10*3/uL — ABNORMAL LOW (ref 0.7–4.0)
Lymphs Abs: 0.6 10*3/uL — ABNORMAL LOW (ref 0.7–4.0)
Lymphs Abs: 0.7 10*3/uL (ref 0.7–4.0)
Lymphs Abs: 0.7 10*3/uL (ref 0.7–4.0)
Lymphs Abs: 0.8 10*3/uL (ref 0.7–4.0)
Monocytes Absolute: 0 10*3/uL — ABNORMAL LOW (ref 0.1–1.0)
Monocytes Absolute: 0 10*3/uL — ABNORMAL LOW (ref 0.1–1.0)
Monocytes Absolute: 0 10*3/uL — ABNORMAL LOW (ref 0.1–1.0)
Monocytes Absolute: 0 10*3/uL — ABNORMAL LOW (ref 0.1–1.0)
Monocytes Absolute: 0 10*3/uL — ABNORMAL LOW (ref 0.1–1.0)
Monocytes Absolute: 0 10*3/uL — ABNORMAL LOW (ref 0.1–1.0)
Monocytes Relative: 2 % — ABNORMAL LOW (ref 3–12)
Monocytes Relative: 2 % — ABNORMAL LOW (ref 3–12)
Monocytes Relative: 3 % (ref 3–12)
Monocytes Relative: 5 % (ref 3–12)
Monocytes Relative: 3 % (ref 3–12)
Monocytes Relative: 3 % (ref 3–12)
Neutro Abs: 0.5 10*3/uL — ABNORMAL LOW (ref 1.7–7.7)
Neutro Abs: 0.6 10*3/uL — ABNORMAL LOW (ref 1.7–7.7)
Neutro Abs: 0.8 10*3/uL — ABNORMAL LOW (ref 1.7–7.7)
Neutro Abs: 0.4 10*3/uL — ABNORMAL LOW (ref 1.7–7.7)
Neutro Abs: 0.8 10*3/uL — ABNORMAL LOW (ref 1.7–7.7)
Neutrophils Relative %: 38 % — ABNORMAL LOW (ref 43–77)
Neutrophils Relative %: 38 % — ABNORMAL LOW (ref 43–77)

## 2010-08-18 LAB — BASIC METABOLIC PANEL
BUN: 19 mg/dL (ref 6–23)
BUN: 27 mg/dL — ABNORMAL HIGH (ref 6–23)
BUN: 28 mg/dL — ABNORMAL HIGH (ref 6–23)
BUN: 31 mg/dL — ABNORMAL HIGH (ref 6–23)
BUN: 22 mg/dL (ref 6–23)
CO2: 21 mEq/L (ref 19–32)
CO2: 22 mEq/L (ref 19–32)
CO2: 26 mEq/L (ref 19–32)
CO2: 24 mEq/L (ref 19–32)
Calcium: 8.8 mg/dL (ref 8.4–10.5)
Calcium: 8.9 mg/dL (ref 8.4–10.5)
Calcium: 8.7 mg/dL (ref 8.4–10.5)
Calcium: 8.8 mg/dL (ref 8.4–10.5)
Chloride: 106 mEq/L (ref 96–112)
Chloride: 109 mEq/L (ref 96–112)
Chloride: 111 mEq/L (ref 96–112)
Chloride: 111 mEq/L (ref 96–112)
Chloride: 112 mEq/L (ref 96–112)
Chloride: 114 mEq/L — ABNORMAL HIGH (ref 96–112)
Chloride: 115 mEq/L — ABNORMAL HIGH (ref 96–112)
Creatinine, Ser: 2.09 mg/dL — ABNORMAL HIGH (ref 0.4–1.2)
Creatinine, Ser: 1.48 mg/dL — ABNORMAL HIGH (ref 0.4–1.2)
Creatinine, Ser: 1.5 mg/dL — ABNORMAL HIGH (ref 0.4–1.2)
Creatinine, Ser: 1.94 mg/dL — ABNORMAL HIGH (ref 0.4–1.2)
GFR calc Af Amer: 24 mL/min — ABNORMAL LOW (ref >60)
GFR calc Af Amer: 26 mL/min — ABNORMAL LOW (ref >60)
GFR calc Af Amer: 27 mL/min — ABNORMAL LOW (ref >60)
GFR calc Af Amer: 37 mL/min — ABNORMAL LOW (ref >60)
GFR calc Af Amer: 30 mL/min — ABNORMAL LOW (ref >60)
GFR calc Af Amer: 40 mL/min — ABNORMAL LOW (ref >60)
GFR calc Af Amer: 41 mL/min — ABNORMAL LOW (ref >60)
GFR calc non Af Amer: 20 mL/min — ABNORMAL LOW (ref >60)
GFR calc non Af Amer: 31 mL/min — ABNORMAL LOW (ref >60)
Glucose, Bld: 106 mg/dL — ABNORMAL HIGH (ref 70–99)
Potassium: 3.4 mEq/L — ABNORMAL LOW (ref 3.5–5.1)
Potassium: 3.4 mEq/L — ABNORMAL LOW (ref 3.5–5.1)
Potassium: 3.8 mEq/L (ref 3.5–5.1)
Potassium: 3.3 mEq/L — ABNORMAL LOW (ref 3.5–5.1)
Sodium: 137 mEq/L (ref 135–145)
Sodium: 138 mEq/L (ref 135–145)
Sodium: 138 mEq/L (ref 135–145)
Sodium: 141 mEq/L (ref 135–145)

## 2010-08-18 LAB — COMPREHENSIVE METABOLIC PANEL
ALT: 41 U/L — ABNORMAL HIGH (ref 0–35)
ALT: 19 U/L (ref 0–35)
ALT: 23 U/L (ref 0–35)
AST: 20 U/L (ref 0–37)
AST: 24 U/L (ref 0–37)
AST: 37 U/L (ref 0–37)
AST: 42 U/L — ABNORMAL HIGH (ref 0–37)
Albumin: 2.4 g/dL — ABNORMAL LOW (ref 3.5–5.2)
Albumin: 2.5 g/dL — ABNORMAL LOW (ref 3.5–5.2)
Albumin: 3.6 g/dL (ref 3.5–5.2)
Alkaline Phosphatase: 428 U/L — ABNORMAL HIGH (ref 39–117)
Alkaline Phosphatase: 215 U/L — ABNORMAL HIGH (ref 39–117)
Alkaline Phosphatase: 238 U/L — ABNORMAL HIGH (ref 39–117)
Alkaline Phosphatase: 290 U/L — ABNORMAL HIGH (ref 39–117)
BUN: 16 mg/dL (ref 6–23)
BUN: 19 mg/dL (ref 6–23)
BUN: 28 mg/dL — ABNORMAL HIGH (ref 6–23)
CO2: 21 mEq/L (ref 19–32)
CO2: 23 mEq/L (ref 19–32)
CO2: 25 mEq/L (ref 19–32)
Calcium: 8.4 mg/dL (ref 8.4–10.5)
Calcium: 8.6 mg/dL (ref 8.4–10.5)
Calcium: 8.6 mg/dL (ref 8.4–10.5)
Calcium: 9.3 mg/dL (ref 8.4–10.5)
Chloride: 113 mEq/L — ABNORMAL HIGH (ref 96–112)
Chloride: 114 mEq/L — ABNORMAL HIGH (ref 96–112)
Chloride: 115 mEq/L — ABNORMAL HIGH (ref 96–112)
Creatinine, Ser: 1.53 mg/dL — ABNORMAL HIGH (ref 0.4–1.2)
Creatinine, Ser: 1.54 mg/dL — ABNORMAL HIGH (ref 0.4–1.2)
Creatinine, Ser: 1.91 mg/dL — ABNORMAL HIGH (ref 0.4–1.2)
GFR calc Af Amer: 28 mL/min — ABNORMAL LOW (ref >60)
GFR calc Af Amer: 30 mL/min — ABNORMAL LOW (ref >60)
GFR calc Af Amer: 39 mL/min — ABNORMAL LOW (ref >60)
GFR calc Af Amer: 39 mL/min — ABNORMAL LOW (ref >60)
GFR calc non Af Amer: 24 mL/min — ABNORMAL LOW (ref >60)
GFR calc non Af Amer: 30 mL/min — ABNORMAL LOW (ref >60)
GFR calc non Af Amer: 32 mL/min — ABNORMAL LOW (ref >60)
GFR calc non Af Amer: 32 mL/min — ABNORMAL LOW (ref >60)
Glucose, Bld: 208 mg/dL — ABNORMAL HIGH (ref 70–99)
Glucose, Bld: 104 mg/dL — ABNORMAL HIGH (ref 70–99)
Glucose, Bld: 105 mg/dL — ABNORMAL HIGH (ref 70–99)
Glucose, Bld: 98 mg/dL (ref 70–99)
Glucose, Bld: 99 mg/dL (ref 70–99)
Potassium: 3.8 mEq/L (ref 3.5–5.1)
Potassium: 3.8 mEq/L (ref 3.5–5.1)
Potassium: 4.8 mEq/L (ref 3.5–5.1)
Sodium: 138 mEq/L (ref 135–145)
Sodium: 141 mEq/L (ref 135–145)
Sodium: 141 mEq/L (ref 135–145)
Total Bilirubin: 0.6 mg/dL (ref 0.3–1.2)
Total Bilirubin: 0.7 mg/dL (ref 0.3–1.2)
Total Bilirubin: 0.9 mg/dL (ref 0.3–1.2)
Total Protein: 4.5 g/dL — ABNORMAL LOW (ref 6.0–8.3)
Total Protein: 4.4 g/dL — ABNORMAL LOW (ref 6.0–8.3)
Total Protein: 4.5 g/dL — ABNORMAL LOW (ref 6.0–8.3)
Total Protein: 6.4 g/dL (ref 6.0–8.3)

## 2010-08-18 LAB — FECAL LACTOFERRIN, QUANT

## 2010-08-18 LAB — URINALYSIS, ROUTINE W REFLEX MICROSCOPIC
Glucose, UA: NEGATIVE mg/dL
Hgb urine dipstick: NEGATIVE
Ketones, ur: NEGATIVE mg/dL
Leukocytes, UA: NEGATIVE
Nitrite: NEGATIVE
Nitrite: NEGATIVE
Protein, ur: 100 mg/dL — AB
Protein, ur: 100 mg/dL — AB
Protein, ur: 30 mg/dL — AB
Urobilinogen, UA: 0.2 mg/dL (ref 0.0–1.0)
Urobilinogen, UA: 0.2 mg/dL (ref 0.0–1.0)
pH: 6 (ref 5.0–8.0)

## 2010-08-18 LAB — GLUCOSE, CAPILLARY
Glucose-Capillary: 115 mg/dL — ABNORMAL HIGH (ref 70–99)
Glucose-Capillary: 170 mg/dL — ABNORMAL HIGH (ref 70–99)
Glucose-Capillary: 223 mg/dL — ABNORMAL HIGH (ref 70–99)
Glucose-Capillary: 100 mg/dL — ABNORMAL HIGH (ref 70–99)
Glucose-Capillary: 104 mg/dL — ABNORMAL HIGH (ref 70–99)
Glucose-Capillary: 104 mg/dL — ABNORMAL HIGH (ref 70–99)
Glucose-Capillary: 110 mg/dL — ABNORMAL HIGH (ref 70–99)
Glucose-Capillary: 94 mg/dL (ref 70–99)
Glucose-Capillary: 97 mg/dL (ref 70–99)
Glucose-Capillary: 99 mg/dL (ref 70–99)

## 2010-08-18 LAB — GASTRIC OCCULT BLOOD (1-CARD TO LAB)
Occult Blood, Gastric: POSITIVE — AB
pH, Gastric: 2

## 2010-08-18 LAB — CBC
HCT: 28.1 % — ABNORMAL LOW (ref 36.0–46.0)
HCT: 28.3 % — ABNORMAL LOW (ref 36.0–46.0)
HCT: 29.8 % — ABNORMAL LOW (ref 36.0–46.0)
HCT: 24.5 % — ABNORMAL LOW (ref 36.0–46.0)
HCT: 26.4 % — ABNORMAL LOW (ref 36.0–46.0)
HCT: 27.2 % — ABNORMAL LOW (ref 36.0–46.0)
HCT: 28.7 % — ABNORMAL LOW (ref 36.0–46.0)
Hemoglobin: 10 g/dL — ABNORMAL LOW (ref 12.0–15.0)
Hemoglobin: 10 g/dL — ABNORMAL LOW (ref 12.0–15.0)
Hemoglobin: 9.4 g/dL — ABNORMAL LOW (ref 12.0–15.0)
Hemoglobin: 9.6 g/dL — ABNORMAL LOW (ref 12.0–15.0)
Hemoglobin: 8.2 g/dL — ABNORMAL LOW (ref 12.0–15.0)
Hemoglobin: 8.3 g/dL — ABNORMAL LOW (ref 12.0–15.0)
Hemoglobin: 8.4 g/dL — ABNORMAL LOW (ref 12.0–15.0)
Hemoglobin: 8.9 g/dL — ABNORMAL LOW (ref 12.0–15.0)
Hemoglobin: 8.9 g/dL — ABNORMAL LOW (ref 12.0–15.0)
Hemoglobin: 9.7 g/dL — ABNORMAL LOW (ref 12.0–15.0)
MCHC: 33.6 g/dL (ref 30.0–36.0)
MCHC: 34.3 g/dL (ref 30.0–36.0)
MCHC: 34.3 g/dL (ref 30.0–36.0)
MCHC: 33.2 g/dL (ref 30.0–36.0)
MCHC: 34 g/dL (ref 30.0–36.0)
MCHC: 34 g/dL (ref 30.0–36.0)
MCHC: 34.1 g/dL (ref 30.0–36.0)
MCHC: 34.4 g/dL (ref 30.0–36.0)
MCV: 112.7 fL — ABNORMAL HIGH (ref 78.0–100.0)
MCV: 112.9 fL — ABNORMAL HIGH (ref 78.0–100.0)
MCV: 112.9 fL — ABNORMAL HIGH (ref 78.0–100.0)
MCV: 110.7 fL — ABNORMAL HIGH (ref 78.0–100.0)
MCV: 110.9 fL — ABNORMAL HIGH (ref 78.0–100.0)
MCV: 111.9 fL — ABNORMAL HIGH (ref 78.0–100.0)
MCV: 112.3 fL — ABNORMAL HIGH (ref 78.0–100.0)
MCV: 112.4 fL — ABNORMAL HIGH (ref 78.0–100.0)
MCV: 112.8 fL — ABNORMAL HIGH (ref 78.0–100.0)
Platelets: 28 10*3/uL — CL (ref 150–400)
Platelets: 30 10*3/uL — ABNORMAL LOW (ref 150–400)
Platelets: 23 10*3/uL — CL (ref 150–400)
Platelets: 26 10*3/uL — CL (ref 150–400)
Platelets: 36 10*3/uL — ABNORMAL LOW (ref 150–400)
Platelets: 42 10*3/uL — ABNORMAL LOW (ref 150–400)
RBC: 2.43 MIL/uL — ABNORMAL LOW (ref 3.87–5.11)
RBC: 2.47 MIL/uL — ABNORMAL LOW (ref 3.87–5.11)
RBC: 2.49 MIL/uL — ABNORMAL LOW (ref 3.87–5.11)
RBC: 2.51 MIL/uL — ABNORMAL LOW (ref 3.87–5.11)
RBC: 2.66 MIL/uL — ABNORMAL LOW (ref 3.87–5.11)
RBC: 2.19 MIL/uL — ABNORMAL LOW (ref 3.87–5.11)
RBC: 2.19 MIL/uL — ABNORMAL LOW (ref 3.87–5.11)
RBC: 2.29 MIL/uL — ABNORMAL LOW (ref 3.87–5.11)
RBC: 2.35 MIL/uL — ABNORMAL LOW (ref 3.87–5.11)
RBC: 2.38 MIL/uL — ABNORMAL LOW (ref 3.87–5.11)
RBC: 2.53 MIL/uL — ABNORMAL LOW (ref 3.87–5.11)
RBC: 2.54 MIL/uL — ABNORMAL LOW (ref 3.87–5.11)
RDW: 16.9 % — ABNORMAL HIGH (ref 11.5–15.5)
RDW: 17.5 % — ABNORMAL HIGH (ref 11.5–15.5)
RDW: 18.3 % — ABNORMAL HIGH (ref 11.5–15.5)
RDW: 18.8 % — ABNORMAL HIGH (ref 11.5–15.5)
RDW: 19 % — ABNORMAL HIGH (ref 11.5–15.5)
RDW: 19.4 % — ABNORMAL HIGH (ref 11.5–15.5)
RDW: 20 % — ABNORMAL HIGH (ref 11.5–15.5)
WBC: 1.2 10*3/uL — CL (ref 4.0–10.5)
WBC: 1.4 10*3/uL — CL (ref 4.0–10.5)
WBC: 1.6 10*3/uL — ABNORMAL LOW (ref 4.0–10.5)
WBC: 1.5 10*3/uL — ABNORMAL LOW (ref 4.0–10.5)
WBC: 1.6 10*3/uL — ABNORMAL LOW (ref 4.0–10.5)
WBC: 1.6 10*3/uL — ABNORMAL LOW (ref 4.0–10.5)
WBC: 1.6 10*3/uL — ABNORMAL LOW (ref 4.0–10.5)
WBC: 1.6 10*3/uL — ABNORMAL LOW (ref 4.0–10.5)
WBC: 1.7 10*3/uL — ABNORMAL LOW (ref 4.0–10.5)
WBC: 1.7 10*3/uL — ABNORMAL LOW (ref 4.0–10.5)
WBC: 2.1 10*3/uL — ABNORMAL LOW (ref 4.0–10.5)
WBC: 3 10*3/uL — ABNORMAL LOW (ref 4.0–10.5)

## 2010-08-18 LAB — H. PYLORI ANTIBODY, IGG: H Pylori IgG: 0.7 {ISR}

## 2010-08-18 LAB — LIPASE, BLOOD
Lipase: 22 U/L (ref 11–59)
Lipase: 25 U/L (ref 11–59)
Lipase: 27 U/L (ref 11–59)

## 2010-08-18 LAB — ALKALINE PHOSPHATASE, ISOENZYMES
Alk Phos Liver Fract: 77 %
Alk Phos: 218 U/L — ABNORMAL HIGH (ref 39–117)

## 2010-08-18 LAB — IRON AND TIBC: Iron: 160 ug/dL — ABNORMAL HIGH (ref 42–135)

## 2010-08-18 LAB — STOOL CULTURE

## 2010-08-18 LAB — RETICULOCYTES: Retic Count, Absolute: 37.7 10*3/uL (ref 19.0–186.0)

## 2010-08-18 LAB — POCT URINALYSIS DIPSTICK
Bilirubin, UA: NEGATIVE
Glucose, UA: NEGATIVE
Ketones, UA: NEGATIVE
pH, UA: 6.5

## 2010-08-18 LAB — URINE MICROSCOPIC-ADD ON

## 2010-08-18 LAB — URINE CULTURE
Colony Count: >=100000
Special Requests: NEGATIVE

## 2010-08-18 LAB — OVA AND PARASITE EXAMINATION: Ova and parasites: NONE SEEN

## 2010-08-18 LAB — CLOSTRIDIUM DIFFICILE EIA: C difficile Toxins A+B, EIA: NEGATIVE

## 2010-08-18 LAB — CROSSMATCH

## 2010-08-18 LAB — CHOLESTEROL, TOTAL: Cholesterol: 155 mg/dL (ref 0–200)

## 2010-08-18 LAB — CARDIAC PANEL(CRET KIN+CKTOT+MB+TROPI)
CK, MB: 2.4 ng/mL (ref 0.3–4.0)
Relative Index: INVALID (ref 0.0–2.5)
Relative Index: INVALID (ref 0.0–2.5)
Total CK: 53 U/L (ref 7–177)
Total CK: 60 U/L (ref 7–177)

## 2010-08-18 LAB — IGA: IgA: 66 mg/dL — ABNORMAL LOW (ref 68–378)

## 2010-08-18 LAB — TSH: TSH: 1.091 u[IU]/mL (ref 0.350–4.500)

## 2010-08-18 LAB — MAGNESIUM
Magnesium: 1.7 mg/dL (ref 1.5–2.5)
Magnesium: 1.5 mg/dL (ref 1.5–2.5)
Magnesium: 1.6 mg/dL (ref 1.5–2.5)

## 2010-08-18 LAB — FOLATE: Folate: >20 ng/mL

## 2010-08-18 LAB — PHOSPHORUS
Phosphorus: 3.1 mg/dL (ref 2.3–4.6)
Phosphorus: 2.5 mg/dL (ref 2.3–4.6)

## 2010-08-18 LAB — URIC ACID: Uric Acid, Serum: 8.3 mg/dL — ABNORMAL HIGH (ref 2.4–7.0)

## 2010-08-18 MED ORDER — CIPROFLOXACIN HCL 250 MG PO TABS: 250.0000 mg | ORAL_TABLET | Freq: Two times a day (BID) | ORAL | Status: AC

## 2010-08-18 NOTE — Progress Notes (Signed)
  Subjective:    [name] is a 77 y.o. female who complains of burning with urination. She has had symptoms for 2 days. Patient also complains of dysuria. Patient denies back pain and fever. Patient does have a history of recurrent UTI. Patient does not have a history of pyelonephritis.   The following portions of the patient's history were reviewed and updated as appropriate: allergies, current medications, past family history, past medical history, past social history, past surgical history and problem list.  Review of Systems     patient denies chest pain, shortness of breath, orthopnea. Denies lower extremity edema, abdominal pain, change in appetite, change in bowel movements. Patient denies rashes, musculoskeletal complaints. No other specific complaints in a complete review of systems.   Objective:    Well-developed well-nourished female in no acute distress.Chest clear to auscultation without increased work of breathing. Cardiac exam S1 and S2 are regular. Abdominal exam active bowel sounds, soft, nontender. Extremities no edema. Neurologic exam she is alert without any motor sensory deficits. Gait is normal.  Laboratory:  Urine dipstick: abnormal.   Micro exam: .    Assessment:    Acute cystitis     Plan:    Medications: ciprofloxacin.   Side effects discussed

## 2010-08-20 LAB — BASIC METABOLIC PANEL
BUN: 101 mg/dL — ABNORMAL HIGH (ref 6–23)
BUN: 71 mg/dL — ABNORMAL HIGH (ref 6–23)
BUN: 81 mg/dL — ABNORMAL HIGH (ref 6–23)
CO2: 16 mEq/L — ABNORMAL LOW (ref 19–32)
CO2: 17 mEq/L — ABNORMAL LOW (ref 19–32)
CO2: 18 mEq/L — ABNORMAL LOW (ref 19–32)
Calcium: 10.2 mg/dL (ref 8.4–10.5)
Calcium: 8.8 mg/dL (ref 8.4–10.5)
Calcium: 9.3 mg/dL (ref 8.4–10.5)
Calcium: 9.4 mg/dL (ref 8.4–10.5)
Chloride: 113 mEq/L — ABNORMAL HIGH (ref 96–112)
Chloride: 123 mEq/L — ABNORMAL HIGH (ref 96–112)
Creatinine, Ser: 2.44 mg/dL — ABNORMAL HIGH (ref 0.4–1.2)
Creatinine, Ser: 2.69 mg/dL — ABNORMAL HIGH (ref 0.4–1.2)
Creatinine, Ser: 2.8 mg/dL — ABNORMAL HIGH (ref 0.4–1.2)
Creatinine, Ser: 3.62 mg/dL — ABNORMAL HIGH (ref 0.4–1.2)
GFR calc Af Amer: 15 mL/min — ABNORMAL LOW (ref >60)
GFR calc Af Amer: 19 mL/min — ABNORMAL LOW (ref >60)
GFR calc Af Amer: 20 mL/min — ABNORMAL LOW (ref >60)
GFR calc Af Amer: 23 mL/min — ABNORMAL LOW (ref >60)
GFR calc non Af Amer: 16 mL/min — ABNORMAL LOW (ref >60)
GFR calc non Af Amer: 16 mL/min — ABNORMAL LOW (ref >60)
GFR calc non Af Amer: 17 mL/min — ABNORMAL LOW (ref >60)
GFR calc non Af Amer: 19 mL/min — ABNORMAL LOW (ref >60)
Glucose, Bld: 111 mg/dL — ABNORMAL HIGH (ref 70–99)
Glucose, Bld: 121 mg/dL — ABNORMAL HIGH (ref 70–99)
Potassium: 4.3 mEq/L (ref 3.5–5.1)
Potassium: 4.6 mEq/L (ref 3.5–5.1)
Potassium: 5.7 mEq/L — ABNORMAL HIGH (ref 3.5–5.1)
Sodium: 139 mEq/L (ref 135–145)
Sodium: 141 mEq/L (ref 135–145)

## 2010-08-20 LAB — DIFFERENTIAL
Basophils Relative: 1 % (ref 0–1)
Basophils Relative: 0 % (ref 0–1)
Eosinophils Relative: 5 % (ref 0–5)
Eosinophils Relative: 3 % (ref 0–5)
Lymphocytes Relative: 69 % — ABNORMAL HIGH (ref 12–46)
Monocytes Absolute: 0.1 10*3/uL (ref 0.1–1.0)
Monocytes Absolute: 0 10*3/uL — ABNORMAL LOW (ref 0.1–1.0)
Monocytes Relative: 3 % (ref 3–12)
Monocytes Relative: 1 % — ABNORMAL LOW (ref 3–12)
Neutrophils Relative %: 28 % — ABNORMAL LOW (ref 43–77)
Neutrophils Relative %: 27 % — ABNORMAL LOW (ref 43–77)

## 2010-08-20 LAB — CK TOTAL AND CKMB (NOT AT ARMC)
CK, MB: 3 ng/mL (ref 0.3–4.0)
Total CK: 56 U/L (ref 7–177)

## 2010-08-20 LAB — HSV(HERPES SMPLX)ABS-I+II(IGG+IGM)-BLD
Herpes Simplex Vrs I + II Ab, IgG: 10.1 IV — ABNORMAL HIGH
Herpes Simplex Vrs I&II-IgM Ab (EIA): 0.47 INDEX

## 2010-08-20 LAB — UIFE/LIGHT CHAINS/TP QN, 24-HR UR
Alpha 1, Urine: DETECTED — AB
Alpha 2, Urine: DETECTED — AB
Free Kappa Lt Chains,Ur: 9.4 mg/dL — ABNORMAL HIGH (ref 0.04–1.51)
Free Lambda Lt Chains,Ur: 3.35 mg/dL — ABNORMAL HIGH (ref 0.08–1.01)
Gamma Globulin, Urine: DETECTED — AB

## 2010-08-20 LAB — HEMOGLOBIN A1C
Hgb A1c MFr Bld: 6 % (ref 4.6–6.1)
Mean Plasma Glucose: 126 mg/dL

## 2010-08-20 LAB — CBC
HCT: 22 % — ABNORMAL LOW (ref 36.0–46.0)
Hemoglobin: 7.6 g/dL — ABNORMAL LOW (ref 12.0–15.0)
MCHC: 34.5 g/dL (ref 30.0–36.0)
MCV: 102.6 fL — ABNORMAL HIGH (ref 78.0–100.0)
Platelets: 33 10*3/uL — ABNORMAL LOW (ref 150–400)
Platelets: 34 10*3/uL — ABNORMAL LOW (ref 150–400)
Platelets: 35 10*3/uL — ABNORMAL LOW (ref 150–400)
RBC: 3.03 MIL/uL — ABNORMAL LOW (ref 3.87–5.11)
RDW: 26.9 % — ABNORMAL HIGH (ref 11.5–15.5)
WBC: 2.1 10*3/uL — ABNORMAL LOW (ref 4.0–10.5)
WBC: 2.1 10*3/uL — ABNORMAL LOW (ref 4.0–10.5)

## 2010-08-20 LAB — CMV ANTIBODY, IGG (EIA): CMV Ab - IgG: >10 IU/mL — ABNORMAL HIGH (ref <0.4)

## 2010-08-20 LAB — FERRITIN: Ferritin: 752 ng/mL — ABNORMAL HIGH (ref 10–291)

## 2010-08-20 LAB — CROSSMATCH: ABO/RH(D): O NEG

## 2010-08-20 LAB — TISSUE HYBRIDIZATION (BONE MARROW)-NCBH

## 2010-08-20 LAB — PROTEIN ELECTROPHORESIS, SERUM
Albumin ELP: 57.2 % (ref 55.8–66.1)
Alpha-1-Globulin: 6.2 % — ABNORMAL HIGH (ref 2.9–4.9)
Alpha-2-Globulin: 10.5 % (ref 7.1–11.8)
Beta 2: 5 % (ref 3.2–6.5)
Beta Globulin: 5 % (ref 4.7–7.2)
Gamma Globulin: 16.1 % (ref 11.1–18.8)

## 2010-08-20 LAB — EHRLICHIA ANTIBODY PANEL
E chaffeensis (HGE) Ab, IgG: <1:64 {titer}
Interpretation-ECHAB: NOT DETECTED

## 2010-08-20 LAB — CMV IGM: CMV IgM: <8 AU/mL (ref <30.0)

## 2010-08-20 LAB — DIRECT ANTIGLOBULIN TEST (NOT AT ARMC): DAT, complement: NEGATIVE

## 2010-08-20 LAB — IRON AND TIBC
Iron: 167 ug/dL — ABNORMAL HIGH (ref 42–135)
Saturation Ratios: 73 % — ABNORMAL HIGH (ref 20–55)
TIBC: 228 ug/dL — ABNORMAL LOW (ref 250–470)
UIBC: 61 ug/dL

## 2010-08-20 LAB — MRSA PCR SCREENING: MRSA by PCR: NEGATIVE

## 2010-08-20 LAB — VITAMIN B12: Vitamin B-12: >2000 pg/mL — ABNORMAL HIGH (ref 211–911)

## 2010-08-20 LAB — COMPREHENSIVE METABOLIC PANEL
Albumin: 3.1 g/dL — ABNORMAL LOW (ref 3.5–5.2)
BUN: 93 mg/dL — ABNORMAL HIGH (ref 6–23)
Calcium: 9.5 mg/dL (ref 8.4–10.5)
Creatinine, Ser: 3.12 mg/dL — ABNORMAL HIGH (ref 0.4–1.2)
Glucose, Bld: 102 mg/dL — ABNORMAL HIGH (ref 70–99)
Total Protein: 6.1 g/dL (ref 6.0–8.3)

## 2010-08-20 LAB — RETICULOCYTES: RBC.: 2.59 MIL/uL — ABNORMAL LOW (ref 3.87–5.11)

## 2010-08-20 LAB — PATHOLOGIST SMEAR REVIEW

## 2010-08-20 LAB — HEPATITIS PANEL, ACUTE: Hep B C IgM: NEGATIVE

## 2010-08-20 LAB — BONE MARROW EXAM

## 2010-08-20 LAB — SODIUM, URINE, RANDOM: Sodium, Ur: 55 mEq/L

## 2010-08-20 LAB — HAPTOGLOBIN: Haptoglobin: 71 mg/dL (ref 16–200)

## 2010-08-20 LAB — CHROMOSOME ANALYSIS, BONE MARROW

## 2010-08-21 ENCOUNTER — Telehealth: Payer: Self-pay | Admitting: *Deleted

## 2010-08-21 NOTE — Telephone Encounter (Signed)
Urine culture

## 2010-08-21 NOTE — Telephone Encounter (Signed)
UTI again?  What to do?

## 2010-08-21 NOTE — Telephone Encounter (Signed)
Pt aware, appt scheduled. °

## 2010-08-22 ENCOUNTER — Other Ambulatory Visit (INDEPENDENT_AMBULATORY_CARE_PROVIDER_SITE_OTHER): Payer: Medicare Other | Admitting: Internal Medicine

## 2010-08-22 DIAGNOSIS — N390 Urinary tract infection, site not specified: Secondary | ICD-10-CM

## 2010-08-22 LAB — POCT URINALYSIS DIPSTICK
Glucose, UA: NEGATIVE
Nitrite, UA: NEGATIVE
Spec Grav, UA: 1.02
Urobilinogen, UA: 0.2
pH, UA: 6

## 2010-08-24 LAB — COMPREHENSIVE METABOLIC PANEL
Albumin: 3.4 g/dL — ABNORMAL LOW (ref 3.5–5.2)
Alkaline Phosphatase: 128 U/L — ABNORMAL HIGH (ref 39–117)
BUN: 24 mg/dL — ABNORMAL HIGH (ref 6–23)
Calcium: 9.3 mg/dL (ref 8.4–10.5)
Creatinine, Ser: 1.68 mg/dL — ABNORMAL HIGH (ref 0.4–1.2)
Potassium: 4.4 mEq/L (ref 3.5–5.1)
Total Protein: 6.5 g/dL (ref 6.0–8.3)

## 2010-08-24 LAB — URINALYSIS, ROUTINE W REFLEX MICROSCOPIC
Glucose, UA: NEGATIVE mg/dL
Nitrite: POSITIVE — AB
Specific Gravity, Urine: 1.01 (ref 1.005–1.030)
pH: 6 (ref 5.0–8.0)

## 2010-08-24 LAB — CROSSMATCH
ABO/RH(D): O NEG
Antibody Screen: NEGATIVE

## 2010-08-24 LAB — URINE MICROSCOPIC-ADD ON

## 2010-08-24 LAB — DIFFERENTIAL
Basophils Absolute: 0 10*3/uL (ref 0.0–0.1)
Eosinophils Relative: 3 % (ref 0–5)
Lymphocytes Relative: 51 % — ABNORMAL HIGH (ref 12–46)
Monocytes Relative: 3 % (ref 3–12)
Neutrophils Relative %: 43 % (ref 43–77)

## 2010-08-24 LAB — URINE CULTURE: Colony Count: >=100000

## 2010-08-24 LAB — CBC
HCT: 25.1 % — ABNORMAL LOW (ref 36.0–46.0)
MCHC: 33 g/dL (ref 30.0–36.0)
Platelets: 39 10*3/uL — ABNORMAL LOW (ref 150–400)
RDW: 27.1 % — ABNORMAL HIGH (ref 11.5–15.5)

## 2010-08-25 ENCOUNTER — Other Ambulatory Visit: Payer: Self-pay | Admitting: *Deleted

## 2010-08-25 DIAGNOSIS — N390 Urinary tract infection, site not specified: Secondary | ICD-10-CM

## 2010-08-25 MED ORDER — CIPROFLOXACIN HCL 500 MG PO TABS: 500.0000 mg | ORAL_TABLET | Freq: Two times a day (BID) | ORAL | Status: AC

## 2010-08-28 ENCOUNTER — Encounter (HOSPITAL_BASED_OUTPATIENT_CLINIC_OR_DEPARTMENT_OTHER): Payer: Medicare Other | Admitting: Oncology

## 2010-08-28 ENCOUNTER — Other Ambulatory Visit: Payer: Self-pay | Admitting: Oncology

## 2010-08-28 DIAGNOSIS — D649 Anemia, unspecified: Secondary | ICD-10-CM

## 2010-08-28 DIAGNOSIS — D4620 Refractory anemia with excess of blasts, unspecified: Secondary | ICD-10-CM

## 2010-08-28 DIAGNOSIS — N289 Disorder of kidney and ureter, unspecified: Secondary | ICD-10-CM

## 2010-08-28 DIAGNOSIS — D696 Thrombocytopenia, unspecified: Secondary | ICD-10-CM

## 2010-08-28 LAB — COMPREHENSIVE METABOLIC PANEL
ALT: <8 U/L (ref 0–35)
AST: 16 U/L (ref 0–37)
Albumin: 3.8 g/dL (ref 3.5–5.2)
Alkaline Phosphatase: 93 U/L (ref 39–117)
Calcium: 9.1 mg/dL (ref 8.4–10.5)
Chloride: 112 mEq/L (ref 96–112)
Potassium: 5.7 mEq/L — ABNORMAL HIGH (ref 3.5–5.3)
Sodium: 139 mEq/L (ref 135–145)
Total Protein: 6.2 g/dL (ref 6.0–8.3)

## 2010-08-28 LAB — CBC WITH DIFFERENTIAL/PLATELET
BASO%: 0.2 % (ref 0.0–2.0)
Basophils Absolute: 0 10*3/uL (ref 0.0–0.1)
EOS%: 1.7 % (ref 0.0–7.0)
HGB: 8.8 g/dL — ABNORMAL LOW (ref 11.6–15.9)
MCH: 36.6 pg — ABNORMAL HIGH (ref 25.1–34.0)
MCV: 106.2 fL — ABNORMAL HIGH (ref 79.5–101.0)
MONO%: 1.7 % (ref 0.0–14.0)
NEUT#: 0.5 10*3/uL — ABNORMAL LOW (ref 1.5–6.5)
RBC: 2.4 10*6/uL — ABNORMAL LOW (ref 3.70–5.45)
RDW: 24.5 % — ABNORMAL HIGH (ref 11.2–14.5)
lymph#: 1.7 10*3/uL (ref 0.9–3.3)

## 2010-09-03 LAB — CROSSMATCH

## 2010-09-05 LAB — CROSSMATCH
ABO/RH(D): O NEG
DAT, IgG: NEGATIVE

## 2010-09-09 LAB — CBC
HCT: 27.4 % — ABNORMAL LOW (ref 36.0–46.0)
Platelets: 54 10*3/uL — ABNORMAL LOW (ref 150–400)
RBC: 2.46 MIL/uL — ABNORMAL LOW (ref 3.87–5.11)
WBC: 2.1 10*3/uL — ABNORMAL LOW (ref 4.0–10.5)

## 2010-09-09 LAB — CHROMOSOME ANALYSIS, BONE MARROW

## 2010-09-09 LAB — DIFFERENTIAL
Basophils Absolute: 0 10*3/uL (ref 0.0–0.1)
Eosinophils Relative: 6 % — ABNORMAL HIGH (ref 0–5)
Lymphocytes Relative: 64 % — ABNORMAL HIGH (ref 12–46)
Monocytes Relative: 4 % (ref 3–12)
Neutro Abs: 0.5 10*3/uL — ABNORMAL LOW (ref 1.7–7.7)

## 2010-09-09 LAB — BONE MARROW EXAM

## 2010-09-10 ENCOUNTER — Other Ambulatory Visit: Payer: Self-pay | Admitting: Medical

## 2010-09-10 ENCOUNTER — Other Ambulatory Visit: Payer: Self-pay | Admitting: Oncology

## 2010-09-10 ENCOUNTER — Encounter (HOSPITAL_COMMUNITY)
Admission: RE | Admit: 2010-09-10 | Discharge: 2010-09-10 | Disposition: A | Discharge status: Home / Self Care | Payer: Medicare Other | Source: Ambulatory Visit | Attending: Medical | Admitting: Medical

## 2010-09-10 ENCOUNTER — Encounter (HOSPITAL_BASED_OUTPATIENT_CLINIC_OR_DEPARTMENT_OTHER): Payer: Medicare Other | Admitting: Oncology

## 2010-09-10 DIAGNOSIS — D4620 Refractory anemia with excess of blasts, unspecified: Secondary | ICD-10-CM

## 2010-09-10 DIAGNOSIS — D649 Anemia, unspecified: Secondary | ICD-10-CM | POA: Insufficient documentation

## 2010-09-10 LAB — CBC WITH DIFFERENTIAL/PLATELET
BASO%: 0 % (ref 0.0–2.0)
EOS%: 2.9 % (ref 0.0–7.0)
MCH: 35.1 pg — ABNORMAL HIGH (ref 25.1–34.0)
MCHC: 32.8 g/dL (ref 31.5–36.0)
RDW: 20.6 % — ABNORMAL HIGH (ref 11.2–14.5)
WBC: 2.1 10*3/uL — ABNORMAL LOW (ref 3.9–10.3)
lymph#: 1.6 10*3/uL (ref 0.9–3.3)
nRBC: 0 % (ref 0–0)

## 2010-09-12 ENCOUNTER — Encounter (HOSPITAL_BASED_OUTPATIENT_CLINIC_OR_DEPARTMENT_OTHER): Payer: Medicare Other | Admitting: Oncology

## 2010-09-13 LAB — CROSSMATCH
ABO/RH(D): O NEG
Antibody Screen: NEGATIVE
Unit division: 0
Unit division: 0

## 2010-09-15 ENCOUNTER — Encounter (HOSPITAL_COMMUNITY): Payer: Medicare Other

## 2010-09-17 ENCOUNTER — Telehealth: Payer: Self-pay | Admitting: Internal Medicine

## 2010-09-17 MED ORDER — DICYCLOMINE HCL 10 MG PO CAPS: ORAL_CAPSULE | ORAL | Status: DC

## 2010-09-17 NOTE — Telephone Encounter (Signed)
Spoke with patient and gave her Dr. Brodie's recommendations. Rx sent to pharmacy. 

## 2010-09-17 NOTE — Telephone Encounter (Signed)
Chart reviewed, intermittent diarrhea seems to be a problem. She is not due for colonoscopy yet. Suspect symptomatic diverticulosis. Please tart metamucil 1 heaping teaspoon daily and cont Bentyl 10 mg, #40, 1 po bid., 1 refill.

## 2010-09-17 NOTE — Telephone Encounter (Signed)
Patient calling to report that on Saturday, she ate [employer] soup and sandwich then had terrible abdominal pain and diarrhea. She took Dicyclomine once and it helped her. This AM she rolled onto her left side and had terrible right lower abdominal pain. States she just does not feel good. Denies N, V,fever, Diarrhea or constipation at this time. She is not sure of her last bowel movement-?Saturday. She states she is not eating much but is drinking fluids well. She is worried she might need a colonoscopy. Flex sig- 11/01/09- mod. Severe diverticulosis. Please, advise

## 2010-09-22 ENCOUNTER — Encounter: Payer: Self-pay | Admitting: Family Medicine

## 2010-09-22 ENCOUNTER — Telehealth: Payer: Self-pay | Admitting: *Deleted

## 2010-09-22 ENCOUNTER — Ambulatory Visit (INDEPENDENT_AMBULATORY_CARE_PROVIDER_SITE_OTHER): Payer: Medicare Other | Admitting: Family Medicine

## 2010-09-22 VITALS — BP 160/60 | Temp 97.4°F | Wt 131.0 lb

## 2010-09-22 DIAGNOSIS — M109 Gout, unspecified: Secondary | ICD-10-CM

## 2010-09-22 DIAGNOSIS — R300 Dysuria: Secondary | ICD-10-CM

## 2010-09-22 DIAGNOSIS — D469 Myelodysplastic syndrome, unspecified: Secondary | ICD-10-CM

## 2010-09-22 LAB — POCT URINALYSIS DIPSTICK
Bilirubin, UA: NEGATIVE
Ketones, UA: NEGATIVE
pH, UA: 5

## 2010-09-22 MED ORDER — PREDNISONE 10 MG PO TABS: ORAL_TABLET | ORAL | Status: DC

## 2010-09-22 MED ORDER — CEPHALEXIN 500 MG PO CAPS: 500.0000 mg | ORAL_CAPSULE | Freq: Three times a day (TID) | ORAL | Status: AC

## 2010-09-22 NOTE — Patient Instructions (Signed)
Gout Gout is an inflammatory condition (arthritis) caused by a buildup of uric acid crystals in the joints. Uric acid is a chemical that is normally present in the blood. Under some circumstances, uric acid can form into crystals in your joints. This causes joint redness, soreness, and swelling (inflammation). Repeat attacks are common. Over time, uric acid crystals can form into masses (tophi) near a joint, causing disfigurement. Gout is treatable and often preventable. CAUSES The disease begins with elevated levels of uric acid in the blood. Uric acid is produced by your body when it breaks down a naturally found substance called purines. This also happens when you eat certain foods such as meats and fish. Causes of an elevated uric acid level include:  Being passed down from parent to child (heredity).   Diseases that cause increased uric acid production (obesity, psoriasis, some cancers).   Excessive alcohol use.   Diet, especially diets rich in meat and seafood.   Medicines, including certain cancer-fighting drugs (chemotherapy), diuretics, and aspirin.   Chronic kidney disease. The kidneys are no longer able to remove uric acid well.   Problems with metabolism.  Conditions strongly associated with gout include:  Obesity.   High blood pressure.   High cholesterol.   Diabetes.  Not everyone with elevated uric acid levels gets gout. It is not understood why some people get gout and others do not. Surgery, joint injury, and eating too much of certain foods are some of the factors that can lead to gout. SYMPTOMS  An attack of gout comes on quickly. It causes intense pain with redness, swelling, and warmth in a joint.   Fever can occur.   Often, only one joint is involved. Certain joints are more commonly involved:   Base of the big toe.   Knee.   Ankle.   Wrist.   Finger.  Without treatment, an attack usually goes away in a few days to weeks. Between attacks, you usually  will not have symptoms, which is different from many other forms of arthritis. DIAGNOSIS Your caregiver will suspect gout based on your symptoms and exam. Removal of fluid from the joint (arthrocentesis) is done to check for uric acid crystals. Your caregiver will give you a medicine that numbs the area (local anesthetic) and use a needle to remove joint fluid for exam. Gout is confirmed when uric acid crystals are seen in joint fluid, using a special microscope. Sometimes, blood, urine, and X-ray tests are also used. TREATMENT There are 2 phases to gout treatment: treating the sudden onset (acute) attack and preventing attacks (prophylaxis). Treatment of an Acute Attack  Medicines are used. These include anti-inflammatory medicines or steroid medicines.   An injection of steroid medicine into the affected joint is sometimes necessary.   The painful joint is rested. Movement can worsen the arthritis.   You may use warm or cold treatments on painful joints, depending which works best for you.   Discuss the use of coffee, vitamin C, or cherries with your caregiver. These may be helpful treatment options.  Treatment to Prevent Attacks After the acute attack subsides, your caregiver may advise prophylactic medicine. These medicines either help your kidneys eliminate uric acid from your body or decrease your uric acid production. You may need to stay on these medicines for a very long time. The early phase of treatment with prophylactic medicine can be associated with an increase in acute gout attacks. For this reason, during the first few months of treatment, your caregiver   may also advise you to take medicines usually used for acute gout treatment. Be sure you understand your caregiver's directions. You should also discuss dietary treatment with your caregiver. Certain foods such as meats and fish can increase uric acid levels. Other foods such as dairy can decrease levels. Your caregiver can give  you a list of foods to avoid. HOME CARE INSTRUCTIONS  Do not take aspirin to relieve pain. This raises uric acid levels.   Only take over-the-counter or prescription medicines for pain, discomfort, or fever as directed by your caregiver.   Rest the joint as much as possible. When in bed, keep sheets and blankets off painful areas.   Keep the affected joint raised (elevated).   Use crutches if the painful joint is in your leg.   Drink enough water and fluids to keep your urine clear or pale yellow. This helps your body get rid of uric acid. Do not drink alcoholic beverages. They slow the passage of uric acid.   Follow your caregiver's dietary instructions. Pay careful attention to the amount of protein you eat. Your daily diet should emphasize fruits, vegetables, whole grains, and fat-free or low-fat milk products.   Maintain a healthy body weight.  SEEK MEDICAL CARE IF:  You have an oral temperature above 101.   You develop diarrhea, vomiting, or any side effects from medicines.   You do not feel better in 24 hours, or you are getting worse.  SEEK IMMEDIATE MEDICAL CARE IF:  Your joint becomes suddenly more tender and you have:   Chills.   An oral temperature above 101, not controlled by medicine.  MAKE SURE YOU:  Understand these instructions.   Will watch your condition.   Will get help right away if you are not doing well or get worse.  Document Released: 05/15/2000 Document Re-Released: 11/05/2009 [employer] Patient Information 2011 [employer]. 

## 2010-09-22 NOTE — Progress Notes (Signed)
  Subjective:    Patient ID: [name], female    DOB: 1925-08-27, 77 y.o.   MRN: [id number]  HPI Patient has multiple chronic problems including past history of renal cell cancer, osteoporosis, myelodysplastic syndrome, chronic kidney disease, hypertension, hyperlipidemia, gout, GERD, CHF who is seen today as a work in with the following issues  She has history of gout with current flare up involving right metatarsophalangeal joint. No injury. 3-4 days of redness and moderate pain. She cannot take nonsteroidals secondary to history of severe thrombocytopenia. No alcohol use. No use of thiazide diuretics. No clear dietary precipitants.  Recurrent dysuria. Several day history of some burning with urination. No fever or chills. Some urinary urgency and frequency. Recent urine culture positive for Proteus mirabilis. Resistant to Cipro. Took Keflex without difficulty and the symptoms eventually improved.   Review of Systems  Constitutional: Negative for fever, chills, activity change and appetite change.  Respiratory: Negative for cough.   Cardiovascular: Negative for chest pain, palpitations and leg swelling.  Gastrointestinal: Negative for nausea, vomiting, diarrhea and blood in stool.  Musculoskeletal: Positive for joint swelling. Negative for myalgias and gait problem.  Skin: Negative for rash.  Neurological: Negative for headaches.  Hematological: Negative for adenopathy.       Objective:   Physical Exam  Constitutional: She is oriented to person, place, and time. She appears well-developed and well-nourished.  HENT:  Head: Normocephalic and atraumatic.  Right Ear: External ear normal.  Left Ear: External ear normal.  Mouth/Throat: No oropharyngeal exudate.  Cardiovascular: Normal rate and regular rhythm.  Exam reveals no gallop.   Pulmonary/Chest: Effort normal and breath sounds normal. She has no wheezes. She has no rales.  Musculoskeletal:       Right foot metatarsophalangeal  joint reveals some mild swelling with warmth and erythema. No ulceration. Good distal foot pulse  Neurological: She is alert and oriented to person, place, and time. No cranial nerve deficit.  Skin:       She has some bruising on both forearms but no other ecchymosis  Psychiatric: She has a normal mood and affect.          Assessment & Plan:  #1 acute gout flareup. Prednisone for the next 5 days. Avoidance of nonsteroidals. She is not a good candidate for prevention given her mild dysplasia and chronic kidney disease. #2 dysuria. Rule out UTI. Urine culture obtained. Cephalexin 500 mg 3 times a day for 7 days  #3 history of myelodysplasia followed closely by hematology

## 2010-09-22 NOTE — Telephone Encounter (Signed)
Called pt back and wanted [place] to be made aware she had a diarrhea accident after going out to eat last week and wondered if in the process of cleaning up she may have done something to cause the UTI.  Pt did inform Dr Juanda Chance of a possible diverticulitis attack, FYI only

## 2010-09-22 NOTE — Telephone Encounter (Signed)
Would like to speak to [facility], please.  Just left this am.

## 2010-09-23 ENCOUNTER — Other Ambulatory Visit: Payer: Self-pay | Admitting: Internal Medicine

## 2010-09-23 DIAGNOSIS — Z1231 Encounter for screening mammogram for malignant neoplasm of breast: Secondary | ICD-10-CM

## 2010-09-23 NOTE — Telephone Encounter (Signed)
Unclear cause of uti

## 2010-09-25 ENCOUNTER — Other Ambulatory Visit: Payer: Self-pay | Admitting: Medical

## 2010-09-25 ENCOUNTER — Encounter (HOSPITAL_BASED_OUTPATIENT_CLINIC_OR_DEPARTMENT_OTHER): Payer: Medicare Other | Admitting: Oncology

## 2010-09-25 DIAGNOSIS — D4620 Refractory anemia with excess of blasts, unspecified: Secondary | ICD-10-CM

## 2010-09-25 DIAGNOSIS — N289 Disorder of kidney and ureter, unspecified: Secondary | ICD-10-CM

## 2010-09-25 DIAGNOSIS — D649 Anemia, unspecified: Secondary | ICD-10-CM

## 2010-09-25 DIAGNOSIS — D696 Thrombocytopenia, unspecified: Secondary | ICD-10-CM

## 2010-09-25 DIAGNOSIS — D61818 Other pancytopenia: Secondary | ICD-10-CM

## 2010-09-25 LAB — COMPREHENSIVE METABOLIC PANEL
ALT: 27 U/L (ref 0–35)
Albumin: 3.4 g/dL — ABNORMAL LOW (ref 3.5–5.2)
CO2: 24 mEq/L (ref 19–32)
Calcium: 9.6 mg/dL (ref 8.4–10.5)
Chloride: 111 mEq/L (ref 96–112)
Potassium: 6.2 mEq/L — ABNORMAL HIGH (ref 3.5–5.3)
Sodium: 141 mEq/L (ref 135–145)
Total Protein: 6.4 g/dL (ref 6.0–8.3)

## 2010-09-25 LAB — CBC WITH DIFFERENTIAL/PLATELET
BASO%: 0.6 % (ref 0.0–2.0)
HCT: 28.9 % — ABNORMAL LOW (ref 34.8–46.6)
MCHC: 34.4 g/dL (ref 31.5–36.0)
MONO#: 0 10*3/uL — ABNORMAL LOW (ref 0.1–0.9)
NEUT%: 68.9 % (ref 38.4–76.8)
RBC: 2.77 10*6/uL — ABNORMAL LOW (ref 3.70–5.45)
WBC: 1.5 10*3/uL — ABNORMAL LOW (ref 3.9–10.3)
lymph#: 0.4 10*3/uL — ABNORMAL LOW (ref 0.9–3.3)

## 2010-09-25 LAB — HOLD TUBE, BLOOD BANK

## 2010-09-25 LAB — URINE CULTURE

## 2010-09-26 ENCOUNTER — Ambulatory Visit (INDEPENDENT_AMBULATORY_CARE_PROVIDER_SITE_OTHER): Payer: Medicare Other | Admitting: Internal Medicine

## 2010-09-26 ENCOUNTER — Encounter: Payer: Self-pay | Admitting: Internal Medicine

## 2010-09-26 VITALS — BP 132/70 | HR 82 | Wt 135.0 lb

## 2010-09-26 DIAGNOSIS — M109 Gout, unspecified: Secondary | ICD-10-CM

## 2010-09-26 DIAGNOSIS — L989 Disorder of the skin and subcutaneous tissue, unspecified: Secondary | ICD-10-CM

## 2010-09-26 DIAGNOSIS — E875 Hyperkalemia: Secondary | ICD-10-CM

## 2010-09-26 MED ORDER — SODIUM POLYSTYRENE SULFONATE 15 GM/60ML PO SUSP: ORAL | Status: DC

## 2010-09-26 MED ORDER — METHYLPREDNISOLONE (PAK) 4 MG PO TABS: ORAL_TABLET | ORAL | Status: AC

## 2010-09-26 NOTE — Assessment & Plan Note (Signed)
Improved but not resolved. Avoid anti-inflammatories. Medrol Dosepak. Followup if no improvement or worsening.

## 2010-09-26 NOTE — Progress Notes (Signed)
Pt is aware of urinalysis results.

## 2010-09-26 NOTE — Assessment & Plan Note (Signed)
Appears benign however recommend followup exam with PMD

## 2010-09-26 NOTE — Progress Notes (Signed)
  Subjective:    Patient ID: [name], female    DOB: 1925/11/17, 77 y.o.   MRN: [id number]  HPI Pt presents to clinic for evaluation of forehead skin lesion and right foot pain. Recent seen and evaluated for right foot pain and provided with five-day course of prednisone which significantly improved the pain but it does persist. Patient localizes pain and tenderness to right first toe. Does note a history of gout in the past. Avoiding anti-inflammatories. Does not use prednisone frequently. Pain is worse with walking. Notes forhead skin lesion which is minimally erythematous and slightly raised. States previous physician evaluated the lesion as benign. Patient does not believe area is changing. Does have history of chronic renal sufficiency and did review recent outside labs dated April 26 apparently obtained by hematology oncology. Patient states she was contacted regarding her hemoglobin but not her Chem-7. Creatinine improved to 1.89 from an  2.5 however now hyperkalemic with a potassium of 6.2. Chart review indicates history of mild hyperkalemia in the past in the 5 range. Patient states previously saw nephrology but stopped going on her own. Indicates she has a bottle of Kayexalate at home but has not had to use it. Denies high potassium diet for potassium supplementation. No cardiac palpitations.  Reviewed past medical history, medications and allergies.    Review of Systems see history of present illness     Objective:   Physical Exam    Physical Exam  [nursing notereviewed. Constitutional:  appears well-developed and well-nourished. No distress.  HENT:  Head: Normocephalic and atraumatic.  Nose: Nose normal.  Mouth/Throat: Oropharynx is clear and moist. No oropharyngeal exudate.  Eyes: Conjunctivae are normal. No scleral icterus.  Neck: Neck supple.  Cardiovascular: Normal rate, regular rhythm and normal heart sounds.  Exam reveals no gallop and no friction rub.   No murmur  heard. Pulmonary/Chest: Effort normal and breath sounds normal. No respiratory distress.  no wheezes.  no rales.  Lymphadenopathy:     no cervical adenopathy.  Neurological:  alert.  Skin: Skin is warm and dry.  not diaphoretic. Small approximate 1 cm slightly raised skin lesion mid forehead.     Assessment & Plan:

## 2010-09-26 NOTE — Assessment & Plan Note (Signed)
Hyperkalemia in the setting of chronic renal insufficiency. Avoid potassium supplementation and recommend pursue low potassium diet. Begin Kayexalate 30 g by mouth daily x2 days. Repeat Chem-7 in three days Monday morning. Schedule followup with PMD.

## 2010-09-29 ENCOUNTER — Other Ambulatory Visit (INDEPENDENT_AMBULATORY_CARE_PROVIDER_SITE_OTHER): Payer: Medicare Other | Admitting: Internal Medicine

## 2010-09-29 DIAGNOSIS — E875 Hyperkalemia: Secondary | ICD-10-CM

## 2010-09-29 LAB — BASIC METABOLIC PANEL
Calcium: 9 mg/dL (ref 8.4–10.5)
Creatinine, Ser: 1.7 mg/dL — ABNORMAL HIGH (ref 0.4–1.2)

## 2010-09-30 ENCOUNTER — Telehealth: Payer: Self-pay

## 2010-09-30 NOTE — Telephone Encounter (Signed)
Pt aware. Has f/u appt with pmd on Oct 13, 2010. Pt c/o foot still hurting and wants Dr. Rodena Medin to know. Says she is almost done with the prednisone but does not want anymore

## 2010-09-30 NOTE — Telephone Encounter (Signed)
Message copied by Kyung Rudd on Tue Sep 30, 2010  3:48 PM ------      Message from: Letitia Libra, Maisie Fus      Created: Mon Sep 29, 2010  4:15 PM       Potassium now nl. Kidney fxn stable. Doesn't have to take more kayexalate. Keep f/u appt

## 2010-10-01 ENCOUNTER — Other Ambulatory Visit: Payer: Self-pay | Admitting: *Deleted

## 2010-10-01 MED ORDER — HYDROCODONE-ACETAMINOPHEN 5-500 MG PO TABS: ORAL_TABLET | ORAL | Status: DC

## 2010-10-09 ENCOUNTER — Other Ambulatory Visit: Payer: Self-pay | Admitting: Oncology

## 2010-10-09 ENCOUNTER — Encounter (HOSPITAL_BASED_OUTPATIENT_CLINIC_OR_DEPARTMENT_OTHER): Payer: Medicare Other | Admitting: Oncology

## 2010-10-09 DIAGNOSIS — Z85528 Personal history of other malignant neoplasm of kidney: Secondary | ICD-10-CM

## 2010-10-09 DIAGNOSIS — D4620 Refractory anemia with excess of blasts, unspecified: Secondary | ICD-10-CM

## 2010-10-09 LAB — CBC WITH DIFFERENTIAL/PLATELET
BASO%: 0.2 % (ref 0.0–2.0)
Eosinophils Absolute: 0 10*3/uL (ref 0.0–0.5)
MCHC: 34 g/dL (ref 31.5–36.0)
MCV: 105.9 fL — ABNORMAL HIGH (ref 79.5–101.0)
MONO#: 0 10*3/uL — ABNORMAL LOW (ref 0.1–0.9)
MONO%: 2.2 % (ref 0.0–14.0)
NEUT#: 0.5 10*3/uL — ABNORMAL LOW (ref 1.5–6.5)
RBC: 2.45 10*6/uL — ABNORMAL LOW (ref 3.70–5.45)
RDW: 25.1 % — ABNORMAL HIGH (ref 11.2–14.5)
WBC: 2 10*3/uL — ABNORMAL LOW (ref 3.9–10.3)

## 2010-10-10 ENCOUNTER — Other Ambulatory Visit: Payer: Self-pay | Admitting: Medical

## 2010-10-10 ENCOUNTER — Encounter (HOSPITAL_COMMUNITY)
Admission: RE | Admit: 2010-10-10 | Discharge: 2010-10-10 | Disposition: A | Discharge status: Home / Self Care | Payer: Medicare Other | Source: Ambulatory Visit | Attending: Oncology | Admitting: Oncology

## 2010-10-10 ENCOUNTER — Encounter: Payer: Medicare Other | Admitting: Oncology

## 2010-10-10 ENCOUNTER — Ambulatory Visit: Payer: Medicare Other

## 2010-10-10 DIAGNOSIS — D649 Anemia, unspecified: Secondary | ICD-10-CM | POA: Insufficient documentation

## 2010-10-11 ENCOUNTER — Encounter (HOSPITAL_BASED_OUTPATIENT_CLINIC_OR_DEPARTMENT_OTHER): Payer: Medicare Other | Admitting: Oncology

## 2010-10-11 DIAGNOSIS — D4620 Refractory anemia with excess of blasts, unspecified: Secondary | ICD-10-CM

## 2010-10-11 DIAGNOSIS — D696 Thrombocytopenia, unspecified: Secondary | ICD-10-CM

## 2010-10-12 LAB — CROSSMATCH
ABO/RH(D): O NEG
Antibody Screen: NEGATIVE
Unit division: 0

## 2010-10-13 ENCOUNTER — Ambulatory Visit (INDEPENDENT_AMBULATORY_CARE_PROVIDER_SITE_OTHER): Payer: Medicare Other | Admitting: Internal Medicine

## 2010-10-13 DIAGNOSIS — M109 Gout, unspecified: Secondary | ICD-10-CM

## 2010-10-13 DIAGNOSIS — I10 Essential (primary) hypertension: Secondary | ICD-10-CM

## 2010-10-13 DIAGNOSIS — I6350 Cerebral infarction due to unspecified occlusion or stenosis of unspecified cerebral artery: Secondary | ICD-10-CM

## 2010-10-13 DIAGNOSIS — D469 Myelodysplastic syndrome, unspecified: Secondary | ICD-10-CM

## 2010-10-13 DIAGNOSIS — N390 Urinary tract infection, site not specified: Secondary | ICD-10-CM

## 2010-10-13 LAB — POCT URINALYSIS DIPSTICK
Bilirubin, UA: NEGATIVE
Glucose, UA: NEGATIVE
Ketones, UA: NEGATIVE
Nitrite, UA: NEGATIVE
pH, UA: 6

## 2010-10-13 LAB — BASIC METABOLIC PANEL
BUN: 42 mg/dL — ABNORMAL HIGH (ref 6–23)
CO2: 24 mEq/L (ref 19–32)
Glucose, Bld: 88 mg/dL (ref 70–99)
Potassium: 5.4 mEq/L — ABNORMAL HIGH (ref 3.5–5.1)
Sodium: 135 mEq/L (ref 135–145)

## 2010-10-13 NOTE — Assessment & Plan Note (Signed)
Has regular f/u with hematology

## 2010-10-13 NOTE — Assessment & Plan Note (Signed)
No recurrence. 

## 2010-10-13 NOTE — Assessment & Plan Note (Signed)
She has not taken meds She will go home and take meds Monitor bp at home Goal bp <130/80

## 2010-10-13 NOTE — Progress Notes (Signed)
  Subjective:    Patient ID: [name], female    DOB: 1925/09/10, 77 y.o.   MRN: [id number]  HPI  MDS---required blood transfusion last week  uti---has burning sensation with urination  htn---she has not taken medications today (home bps 130s/60s), no side effects  Hyperkalemia---reviewed labs---improved  Past Medical History  Diagnosis Date  . Acquired pancytopenia   . Hypertension   . Depression   . Osteoporosis   . HLD (hyperlipidemia)   . Fracture, hip     bilateral  . CVA (cerebral infarction)   . Chronic kidney disease   . Renal cell carcinoma   . Stricture esophagus   . Glaucoma   . CHF (congestive heart failure)   . Systolic dysfunction   . Raynaud disease   . Hyperhomocysteinemia    Past Surgical History  Procedure Date  . Abdominal hysterectomy   . Cataract extraction   . Varicose vein surgery   . Tubal ligation   . Hip fracture surgery      bilateral  . Spine surgery     reports that she has never smoked. She does not have any smokeless tobacco history on file. She reports that she does not drink alcohol or use illicit drugs. family history is not on file. Allergies  Allergen Reactions  . Clonidine Hydrochloride     REACTION: Reaction per patient, can't take.  Also references hive on forehead \\T \ spts on chest from other meds  . Doxazosin Mesylate     REACTION: holding due to itching  . Doxycycline Hyclate     REACTION: rash  . Erythromycin Ethylsuccinate     REACTION: unspecified  . Meperidine Hcl     REACTION: unspecified  . Penicillins     REACTION: unspecified  . Sulfonamide Derivatives     REACTION: unspecified     Review of Systems  patient denies chest pain, shortness of breath, orthopnea. Denies lower extremity edema, abdominal pain, change in appetite, change in bowel movements. Patient denies rashes, musculoskeletal complaints. No other specific complaints in a complete review of systems.      Objective:   Physical Exam  Well-developed well-nourished female in no acute distress. HEENT exam atraumatic, normocephalic, extraocular muscles are intact. Neck is supple. No jugular venous distention no thyromegaly. Chest clear to auscultation without increased work of breathing. Cardiac exam S1 and S2 are regular. Abdominal exam active bowel sounds, soft, nontender. Extremities no edema. Neurologic exam she is alert without any motor sensory deficits. Gait she uses a walker        Assessment & Plan:

## 2010-10-13 NOTE — Progress Notes (Signed)
Addended by: Rita Ohara on: 10/13/2010 09:57 AM   Modules accepted: Orders

## 2010-10-14 ENCOUNTER — Encounter (HOSPITAL_COMMUNITY): Payer: Medicare Other

## 2010-10-14 LAB — TYPE & CROSSMATCH - CHCC

## 2010-10-14 NOTE — Op Note (Signed)
[name], [name]                  ACCOUNT NO.:  [id number]   MEDICAL RECORD NO.:  [id number]          PATIENT TYPE:  AMB   LOCATION:  OMED                         FACILITY[facility]  [facility]   PHYSICIAN:  Firas N. Shadad        DATE OF BIRTH:  [birth year]   DATE OF PROCEDURE:  10/15/2008  DATE OF DISCHARGE:                               OPERATIVE REPORT   INDICATION:  An 78 year old female presented with a neutropenia,  lymphocytosis as well as of thrombocytopenia and the setting of also a  mild anemia.  This is a bone marrow biopsy as well as an aspirate to  determine the etiology of her pancytopenia.   DESCRIPTION OF PROCEDURE:  The patient was brought into short stay at  [facility].  IV access was obtained and informed consent after  discussing the risks and benefit for the procedure was obtained from Ms.  Schouten.  After informed consent was obtained the patient has placed in  the decubitus position exposing her left iliac crest.  Skin was prepped  and draped in the sterile fashion using Betadine and sterile towels.  Conscious sedation was obtained utilizing 2 mg of IV Versed.  The skin  and subcutaneous tissue and periosteum was anesthetized adequately using  2% lidocaine.  Using the aspirate needle, aspirate was obtained without  any difficulty.  Using the Jamshidi biopsy needle, and biopsy was  obtained without any difficulty.  The patient tolerated the procedure  well.  There was no bleeding noted.  There was no complication.  The  patient was asked to lay flat on her back for the next 30-45 minutes.  The patient is scheduled to follow-up with me on Oct 19, 2008 at the  [facility] to discuss the results.           ______________________________  Blenda Nicely [facility]  Electronically Signed     FNS/MEDQ  D:  10/15/2008  T:  10/15/2008  Job:  [id number]

## 2010-10-14 NOTE — Op Note (Signed)
NAMEJUANETTE, [name] NO.:  [id number]   MEDICAL RECORD NO.:  [id number]          PATIENT TYPE:  INP   LOCATION:  2899                         FACILITY:  [facility]   PHYSICIAN:  Tia Alert, MD     DATE OF BIRTH:  03-20-1926   DATE OF PROCEDURE:  12/08/2007  DATE OF DISCHARGE:                               OPERATIVE REPORT   PREOPERATIVE DIAGNOSES:  1. Lumbar spinal stenosis L2-3, L3-4 with lumbar disk herniation L2-3.  2. Extraforaminal disk herniation L2-3 on the right.  3. Right leg pain.   POSTOPERATIVE DIAGNOSES:  1. Lumbar spinal stenosis L2-3, L3-4 with lumbar disk herniation L2-3.  2. Extraforaminal disk herniation L2-3 on the right  3. Right leg pain.   PROCEDURES:  1. Decompressive lumbar hemilaminectomy, medial facetectomy and      foraminotomies L2-3, L3-4 on the right for decompression of the L3      and L4 nerve roots with microdiskectomy at L2-3 on the right      utilizing microscopic dissection.  2. Extraforaminal microdiskectomy L2-3 on the right for decompression      of the right L2 nerve root utilizing microscopic dissection.   SURGEON:  Tia Alert, MD   ASSISTANT:  Reinaldo Meeker, MD   ANESTHESIA:  General endotracheal.   COMPLICATIONS:  None apparent.   INDICATIONS FOR THE PROCEDURE:  [name] is an 77 year old female who  presented with severe right leg pain that seemed to involve the right  hip, groin, anterior quadriceps, medial thigh, and knee.  She had an MRI  and a CT scan which showed central stenosis at L2-3 and L3-4 with a  large disk herniation at L2-3 with an extraforaminal component.  She had  significant scoliosis with foraminal stenosis at L3-4 and L2-3 on the  right side.  We felt there was probably compression at L2, the L3, and  possibly the L4 nerve roots.  She had very small pedicles at L2.  She is  osteopenic and elderly, therefore we did not recommend lumbar and back  pain.  We recommended  hemilaminectomy, medial facetectomy, and  foraminotomy L2-3, L3-4 on the right with an extraforaminal diskectomy  at L2-3 on the right for decompression of the L2, L3, and L4 nerve  roots.  The patient understood the risks, benefits, and outcome of the  procedure and wished to proceed.   DESCRIPTION OF PROCEDURE:  The patient was taken to operating room.  After induction of adequate generalized endotracheal anesthesia, she was  rolled in the prone position on the Wilson frame.  All pressure points  were padded.  Her lumbar region was prepped with DuraPrep and draped in  usual sterile fashion.  Local anesthesia 5 mL was injected and a dorsal  midline incision was made and carried down to the lumbosacral fascia.  The fascia was opened and the paraspinous musculature was taken down in  the subperiosteal fashion to expose L2-3 and L3-4 on the right.  Intraoperative x-ray confirmed my level, and then I started at L3-4 and  then used the Kerrison  punch to perform a hemilaminectomy, medial  facetectomy, foraminotomy.  I completed the hemilaminectomy on the right  side going through the L3 and lamina to the L2-3 interspace.  I undercut  the lateral recess removing yellow ligament.  There was quite a bit of  overgrown yellow ligament compressing of the L4 nerve root.  I dissected  out to the medial pedicle wall at L4 and at L3, identified the L3 nerve  root.  I then performed a hemilaminectomy and medial facetectomy at L2-3  on the left being careful with my medial facetectomy because I knew that  I was going extraforaminal at this level.  I undercut the lateral  recess, identified the pedicle, identified the disk space.  There was  significant overgrown of the yellow ligament.  This was removed to  decompress the L3 nerve root.  Once the L3-L4 nerve roots were  decompressed, I knew I had a large disk herniation medially, but I  wanted to go extraforaminal, therefore I went extraforaminally and   drilled the lateral part of the pars and the superior part of the facet.  The underlying yellow ligament was opened and removed in a piecemeal  fashion to expose the underlying L2 nerve root.  This was then well  decompressed.  I went to the axilla, the nerve root identified.  A large  disk herniation incised.  Additionally, I performed a thorough  extraforaminal diskectomy.  I then palpated with a coronary dilator  along the nerve root into the midline and into the foramen to make sure  that the nerve root was well decompressed.  It was well decompressed.  I  then went back to the intraforaminal space retracted the nerve root  gently medially.  There was a large disk herniation here with a free  fragment.  I incised the disk space to perform a thorough diskectomy  utilizing pituitary rongeurs until the L3 nerve root was well  decompressed.  Once I had my diskectomy completed, I could pass the  coronary dilator from the extraforaminal space into the intraforaminal  space.  I felt no more compression of the L2, the L3, or the L4 nerve  roots.  I irrigated with saline solution and inspected the nerve roots  once again, palpated along the nerve roots to assure adequate  decompression.  Consideration was given to intertransverse fusion in the  interspinous plate; however, I decided against this.  I then irrigated  with saline solution containing bacitracin, dried all bleeding points,  lined the dura with Gelfoam, and then closed the fascia with 0 Vicryl  closing the subcutaneous and subcuticular tissue with 2-0 and 3-0  Vicryl, and closed the skin with Benzoin and Steri-Strips.  The drapes  were removed.  Sterile dressing was applied.  The patient awakened from  general anesthesia and transferred to recovery room in stable condition.  At the end of procedure, all sponge, needle, and instrument counts were  correct.      Tia Alert, MD  Electronically Signed     DSJ/MEDQ  D:   12/08/2007  T:  12/08/2007  Job:  [id number]

## 2010-10-14 NOTE — Op Note (Signed)
[name], [name]                  ACCOUNT NO.:  [id number]   MEDICAL RECORD NO.:  [id number]          PATIENT TYPE:  INP   LOCATION:  5020                         FACILITY:  [facility]   PHYSICIAN:  Nadara Mustard, MD     DATE OF BIRTH:  November 14, 1925   DATE OF PROCEDURE:  03/29/2007  DATE OF DISCHARGE:                               OPERATIVE REPORT   PREOPERATIVE DIAGNOSIS:  Left hip intertrochanteric subtrochanteric  fracture.   POSTOPERATIVE DIAGNOSIS:  Left hip intertrochanteric subtrochanteric  fracture.   PROCEDURE:  Intramedullary nail fixation with a Synthes 11 x 170-mm nail  proximally locked with a 100-mm spiral blade, locked with a 34-mm screw.   SURGEON.:  Nadara Mustard, MD   ANESTHESIA:  LMA.   ESTIMATED BLOOD LOSS:  Minimal.   ANTIBIOTICS:  Clindamycin 600 mg IV.   DRAINS:  None.   COMPLICATIONS:  None.   DISPOSITION:  To PACU in stable condition.   INDICATION FOR PROCEDURE:  The patient is a 77 year old woman who had  her husband fall against her and she sustained a fall onto her left hip,  sustaining a left intertrochanteric subtrochanteric femur fracture.  The  patient was admitted and felt to be stable for surgical intervention and  presents for internal fixation at this time.  Risks and benefits were  discussed including infection, neurovascular injury, persistent pain,  fracture of the bone, need for additional surgery.  The patient states  she understands and wishes to proceed at this time.   DESCRIPTION OF PROCEDURE:  The patient was brought to OR room #4 and  underwent a general anesthetic.  After adequate level of anesthesia  obtained, the patient was placed on the [facility] fracture table.  Her  left lower extremity was placed in the boot traction and her foot was  well-padded, and her right lower extremity was placed in the dorsal  lithotomy position and this was also padded.  Care was taken to protect  the right total hip arthroplasty.  The patient's  position was checked  with C-arm and the left lower extremity was prepped using DuraPrep and  draped into a sterile field with a shower curtain.  An incision was made  proximal to the greater trochanter.  A guidewire was inserted center-  center in through the greater trochanter.  This was checked with a C-arm  fluoroscopy in AP and lateral planes.  This was then over-reamed with  the 17-mm reamer.  The 11 x 170-mm nail was inserted and the guidewire  was inserted just inferior to the center-center of the femoral head.  This was measured for a 100-mm spiral blade, the cortex was drilled, and  the spiral blade was then inserted with C-arm fluoroscopy guidance.  This was checked in AP and lateral planes.  The spiral blade was locked  proximally with a locking screw.  The jig was then removed and the  distal locking screw was placed using the external alignment guide with  a 34-mm locking screw.  The wounds were irrigated with normal saline.  C-  arm fluoroscopy verified  reduction in both AP and lateral  planes.  The subcu was closed using 2-0 Vicryl.  The skin was closed  using Proximate staples.  The wounds were covered with the Mepilex  border dressing.  The patient was removed from the traction, extubated  and taken to the PACU in stable condition, planned for physical therapy,  and anticipate she will need rehab.      Nadara Mustard, MD  Electronically Signed     MVD/MEDQ  D:  03/29/2007  T:  03/29/2007  Job:  [id number]

## 2010-10-14 NOTE — Discharge Summary (Signed)
[name], [name]                  ACCOUNT NO.:  [id number]   MEDICAL RECORD NO.:  [id number]          PATIENT TYPE:  INP   LOCATION:  5020                         FACILITY:  [facility]   PHYSICIAN:  Nadara Mustard, MD     DATE OF BIRTH:  Jun 18, 1925   DATE OF ADMISSION:  03/28/2007  DATE OF DISCHARGE:  04/01/2007                               DISCHARGE SUMMARY   FINAL DIAGNOSIS:  Left intertrochanteric subtrochanteric femur fracture.   SURGICAL PROCEDURE:  IM nail fixation with Synthes intramedullary nail  11 x 170 mm.   DISCHARGE TO:  [name] in stable condition.  Follow  up with Dr. Lajoyce Corners in 2 weeks after discharge.   DISCHARGE MEDICATIONS INCLUDE:  1. Lopressor 25 mg p.o. b.i.d.  2. Norvasc 5 mg p.o. daily.  3. Lotensin 10 mg p.o. daily.  4. Folic acid 1 mg p.o. daily.  5. Xanax 0.5 mg nightly.  6. Protonix 40 mg p.o. b.i.d.  7. Lasix 20 mg p.o. daily.  8. Tylenol 325 mg p.o. nightly p.r.n.  9. Celexa 10 mg p.o. nightly.  10.Ocuvite tablets p.o. daily.  11.Aspirin 325 mg p.o. daily.  12.Ambien 5 mg p.o. nightly p.r.n. sleep.  13.Vicodin one to two p.o. q.4 h. p.r.n. for pain.  14.Robaxin 500 mg p.o. q.6 h. p.r.n. spasm.  15.Laxative of choice.   Physical therapy, progressive ambulation, weightbearing as tolerated on  the left, ambulation with a walker.  Plan to followup with Dr. Lajoyce Corners in 2  weeks.   HISTORY OF PRESENT ILLNESS:  The patient is a 77 year old woman who had  her husband fall against her and she sustained a fall sustaining a left  hip intertrochanteric subtrochanteric femur fracture.  The patient was  admitted and felt to be stable for surgical intervention.  The patient  was admitted on October 27 and underwent an IM nail fixation of the hip  on October 28.  She received clindamycin for infection prophylaxis, and  was kept on her aspirin for DVT prophylaxis.   On postoperative day #1 her hemoglobin dropped to 7.3.  This was not  blood loss  anemia.  However, more due to a dilution secondary to the  patient being dehydrated.  She received 2 units of packed red blood  cells on October 29.   Postoperatively the patient progressed slowly.  She was not felt to be  safe to go home.  The patient's husband also was not safe to be cared  for by himself due to his dementia, and the patient and her husband were  set up for skilled nursing care at the same facility.  She was  discharged to skilled nursing in stable condition on October 31.  Follow  up with Dr. Lajoyce Corners on 2 weeks.      Nadara Mustard, MD  Electronically Signed     MVD/MEDQ  D:  04/01/2007  T:  04/01/2007  Job:  [id number]

## 2010-10-15 ENCOUNTER — Ambulatory Visit: Payer: Medicare Other

## 2010-10-16 LAB — URINE CULTURE

## 2010-10-17 NOTE — Discharge Summary (Signed)
NAMEEMBER, HENRIKSON                  ACCOUNT NO.:  [id number]   MEDICAL RECORD NO.:  [id number]          PATIENT TYPE:  IPS   LOCATION:  4007                         FACILITY:  [facility]   PHYSICIAN:  Ellwood Dense, M.D.   DATE OF BIRTH:  1925-08-14   DATE OF ADMISSION:  08/03/2005  DATE OF DISCHARGE:  08/18/2005                                 DISCHARGE SUMMARY   DISCHARGE DIAGNOSES:  1.  Right femoral neck fracture with right total hip replacement in July 30, 2005, with pain management.  2.  Coumadin for deep venous thrombosis prophylaxis.  3.  Postoperative anemia.  4.  Chronic renal insufficiency.  5.  Hypertension.  6.  History of left pons cerebrovascular accident in April 2005.  7.  Gastroesophageal reflux disease.  8.  History of urinary incontinence.   HISTORY OF PRESENT ILLNESS:  A 77 year old female admitted to [facility] on February 28th after a fall without loss of consciousness  sustaining a right femoral neck fracture. She underwent a right total hip  replacement on July 30, 2005, per Dr. Charlann Boxer. She was placed on Coumadin for  deep venous thrombosis prophylaxis, partial weightbearing. Postoperative  anemia 8.3. Transfused 2 units of packed red blood cells on July 31, 2005,  with hemoglobin improved to 9.5.  Chronic renal insufficiency remained  stable, creatinine 2.2 to 2.7.  The patient was admitted for a comprehensive  rehab program.   PAST MEDICAL HISTORY:  See discharge diagnoses. No alcohol or tobacco.   ALLERGIES:  PENICILLIN, SULFA, DEMEROL, and E-MYCIN.   SOCIAL HISTORY:  Lives with elderly husband. Husband needs assistance with  mobility and activities of daily living. Son in area works. One-level home  with multiple entry steps.   MEDICATIONS PRIOR TO ADMISSION:  1.  Metoprolol 25 mg b.i.d.  2.  Lotrel 5-10 mg daily.  3.  Prilosec 20 mg daily.  4.  Aspirin 325 mg daily.  5.  Lorazepam 2.5 mg at bedtime.  6.  Lasix 40 mg daily.  7.   Equate 500 mg at bedtime.  8.  Vitamin E at bedtime.   REHABILITATION HOSPITAL COURSE:  The patient was admitted to inpatient  rehabilitation services with therapies initiated on a b.i.d. basis  consisting of physical therapy, occupational therapy, and rehabilitation  nursing. The following issues were addressed during the patient's  rehabilitation stay. Pertaining to Ms. Phimmasone's right femoral neck fracture  she had undergone right total hip replacement on July 30, 2005, per Dr.  Charlann Boxer. She was ambulating extended household distances with a walker, partial  weightbearing with hip precautions. Supervision upper body activities of  daily living, minimal assist lower body. Home health therapies have been  arranged. Pain management ongoing with the use of Vicodin and Robaxin with  good results. She remained on Coumadin for deep venous thrombosis  prophylaxis with latest INR of 2.0. She will complete Coumadin protocol and  resume her aspirin therapy after Coumadin completed. Postoperative anemia  stable with latest hemoglobin of 9.1, hematocrit 26.3. She remained on iron  supplement.  Chronic renal insufficiency with latest creatinine of 2.1 which  was her baseline and monitored. Blood pressure was controlled with diastolic  pressure of 68 to 72 on Lotensin, Lasix, and Lopressor. She would follow up  with her primary MD, Dr. Drue Novel. She did have a history of some mild urinary  incontinence. Post void residuals were within acceptable limits with latest  post void residual of 57. She was discharged to home in stable condition.   Latest labs showed an INR of 2.0, hemoglobin 9.1, hematocrit 26.3, platelet  count 197,000. Sodium 138, potassium 4.6, BUN 39, creatinine 2.1.   Discharge medications at the time of dictation included Coumadin with latest  dose of 3 mg daily to be completed on August 30, 2005, and stop; Lotensin 10  mg daily; Trinsicon one capsule b.i.d.; Lasix 20 mg daily; Lopressor 25 mg   b.i.d.; multivitamin daily; Protonix 40 mg daily; potassium chloride 10 mEq  daily; Foltx one tablet daily; Robaxin 500 mg q.6h. p.r.n. spasms; Vicodin  one or two tabs q.4h. as needed for pain; Xanax 0.25 mg t.i.d. as needed.   ACTIVITY:  Partial weightbearing with hip precautions.   DIET:  Regular.   WOUND CARE:  Cleanse incision daily with warm soap and water.   SPECIAL INSTRUCTIONS:  Home health nurse to complete Coumadin protocol. The  patient should resume aspirin therapy after Coumadin completed.   FOLLOWUP:  With Dr. Drue Novel for medical management; Dr. Charlann Boxer orthopedic  services.      Mariam Dollar, P.A.    ______________________________  Ellwood Dense, M.D.    DA/MEDQ  D:  08/17/2005  T:  08/17/2005  Job:  [id number]   cc:   Wanda Plump, MD [facility]  [phone] [place], [place] [address]   Madlyn Frankel. Charlann Boxer, M.D.  Fax: [phone]

## 2010-10-17 NOTE — Consult Note (Signed)
[name], [name]                  ACCOUNT NO.:  [id number]   MEDICAL RECORD NO.:  [id number]          PATIENT TYPE:  INP   LOCATION:  1829                         FACILITY:  [facility]   PHYSICIAN:  Delton See, P.A. LHCDATE OF BIRTH:  Oct 30, 1925   DATE OF CONSULTATION:  DATE OF DISCHARGE:                                   CONSULTATION   DATE OF CONSULTATION:  July 21, 2004.   REASON FOR CONSULTATION:  Syncope.   BRIEF HISTORY:  We were asked to see this pleasant 77 year old female, who  was admitted to [facility] by Dr. Drue Novel today after she was seen in  his office and had a syncopal spell.  The patient has been in bed for  several days with a respiratory tract infection.  She has not been eating or  drinking well.  She states that she had a CVA last April and has not been  eating well ever since and has had some weight loss associated with this.  The patient has been feeling very weak.  She decided to go see her primary  care physician, Dr. Ruthine Dose, today; however, Dr. Ruthine Dose was not in and she was  seen by Dr. Drue Novel.  While at the office, she was being weighed and she had a  syncopal episode.  She stated that everything just got dark.  She denies any  chest pain or shortness of breath.  She felt dizzy just before going out.  The patient states she has not been resting well.  She denies any other  episodes of syncope.  She denies any recent chest pain or shortness of  breath.   PAST MEDICAL HISTORY:  1.  Stroke back in April of 2005.  She has had residual slurred speech as      well as some mild, right-sided weakness.  2.  Several recent falls, but these were not syncopal; she just lost her      balance.  3.  Normal Cardiolite in 2005.  4.  Echo in April of 2005 that revealed an EF of 55-65%.  5.  History of hypertension.  6.  History of hyperlipidemia.   ALLERGIES:  1.  DEMEROL.  2.  PENICILLIN.  3.  SULFA.   CURRENT MEDICATIONS:  1.  Fosamax p.r.n.  2.   Tramadol p.r.n.  3.  Premarin 0.625 mg.  4.  Foltx.  5.  Lopressor 25 mg b.i.d.  6.  Lotrel 5/10 one daily.  7.  Lipitor 20 mg daily.  8.  Hydrochlorothiazide 25 mg daily.  9.  Aspirin 325 mg daily.  10. The patient has also been ordered Rocephin and Zithromax.   SOCIAL HISTORY:  Patient lives in [place] with her husband.  He has been  ill recently as well.  She has one sister.  She does not use alcohol or  tobacco.  She is retired from [employer].   FAMILY HISTORY:  Her father died at age 61 from an MI.  Her mother died at  age 1 from natural causes.  She has two brothers, both deceased, one from  heart disease.  Two sisters living.  Neither have been diagnosed with any  heart disease.   REVIEW OF SYSTEMS:  Negative except for the following.  The patient has had  recent chills.  She does not know if she has a fever.  She has had several  recent falls, but she denies syncope or presyncope.  She has had some weight  loss since her stroke.  She has had some recent headaches.  She had a  syncopal episode today.  She has had recent yellow-green sputum associated  with a cough.  She had two CVAs, according to her, last year.  She has a  history of arthralgias, recent nausea, a question of vomiting, and decreased  appetite.  She also has cold intolerance.   PHYSICAL EXAMINATION:  Please note the exam was performed in the hallway in  the emergency room.  It was a noisy area and this was a difficult exam.  VITAL SIGNS:  Blood pressure 106/47, pulse 65, respirations 22, temperature  98.5, oxygen saturation 94%.  GENERAL:  This is a pleasant, 77 year old, white female in no acute  distress.  Her voice sounds very hoarse, and she has some difficulty with  her speech following her CVA last April.  HEENT:  Otherwise unremarkable.  NECK:  No bruits, no jugulovenous distention.  HEART:  Regular rate and rhythm with distant heart sounds.  LUNGS:  Diffuse rhonchi bilaterally.  ABDOMEN:  Soft,  nontender.  SKIN:  Warm and dry.  EXTREMITIES:  Pulses intact without edema.  NEUROLOGICAL:  Significant for mild right-sided weakness and mildly slurred  speech.   LABORATORY DATA:  Chest x-ray is pending.  An EKG shows normal sinus rhythm,  rate 61 beats per minute and is interpreted as a normal EKG.  Abdominal CT  scan was negative for abdominal aortic aneurysm.  A CBC revealed hemoglobin  12.4, hematocrit 35.6, WBCs 8.6 thousand, platelets 148,000.  Other labs are  pending.   IMPRESSION:  1.  Recent syncope of uncertain etiology.  2.  Recent respiratory tract infection.  3.  Normal Cardiolite in 2005.  4.  Echocardiogram in April of 2005 revealing ejection fraction of 55-65%.  5.  History of cerebrovascular accident in April of 2005.  6.  Hypertension.  7.  Osteopenia.  8.  History of multiple surgeries.  9.  Decreased appetite and weight loss since her cerebrovascular accident      and recent nausea.  10. Dyslipidemia.  11. Mild hypotension.  Question dehydration.   PLAN:  The patient is currently being hydrated with IV fluids.  Antibiotics  have been ordered.  She will be placed on a telemetry monitor to rule out  any possible arrhythmia.  We will await further labs.  We may need to hold  her antihypertensive medications for her low blood pressure.  We will check  a D-dimer and orthostatic blood pressures.      DR/MEDQ  D:  07/21/2004  T:  07/21/2004  Job:  [id number]

## 2010-10-17 NOTE — Assessment & Plan Note (Signed)
[facility]                         GASTROENTEROLOGY OFFICE NOTE   [name], [name]                         MRN:          [id number]  DATE:06/08/2006                            DOB:          [birth year]    [name] is a 77 year old white female with history of irritable bowel  syndrome, diverticulosis of the left colon, and questionable chronic  pancreatitis based on abdominal CT scans of the pancreas showing a  nonspecific enlargement of the head of the pancreas.  This has been  followed for the past 15 years with no change in the pancreatic head.  She now has a left upper quadrant abdominal pain radiating to the left  lower quadrant.  Last colonoscopy in 1999 showed a rather tortuous colon  which was difficult to traverse with the colonoscope.  The patient  required a lot of pain medications.  She over the years has lost from  weight from 142 to about 130 pounds.  Last CT scan of the pancreas was  done in February 2006 which did not show any significant abnormality in  the outline of the pancreas.  She is also complaining of hiccups and  increasing reflux.  Upper endoscopy in July 2006 confirmed the presence  of small hiatal hernia with nonobstructing esophageal stricture.  We  increased her Prilosec to 20 mg twice a day and requested stopping her  Fosamax at least temporarily.   MEDICATIONS:  1. Metoprolol 25 mg p.o. b.i.d.  2. Lotrel 5/10 one p.o. daily.  3. Aspirin 325 mg p.o. daily.  4. Prilosec 20 mg p.o. daily.  5. Folic acid 1 mg p.o. daily.  6. Colace 100 mg b.i.d.  7. Levsin sublingual 0.125 mg p.r.n. abdominal pain.   The patient is being followed by Dr. Briant Cedar for renal insufficiency.   PHYSICAL EXAMINATION:  VITAL SIGNS:  Blood pressure 120/60, pulse 68 and  weight is 132 pounds.  GENERAL:  She was alert and oriented, appeared somewhat weak, moved  around with a cane after hip fracture and re-fracture of the hip within  the  last year.  LUNGS:  Clear to auscultation.  CARDIAC:  With normal S1 and normal S2.  ABDOMEN:  Soft with pulsations at the abdominal aorta without bruit.  In  the left upper quadrant there was a tenderness overlying this area which  also extended to the left lower quadrant.  There was no fullness in the  left lower quadrant and no rebound.  Liver edge was at costal margin.  RECTAL:  Showed soft hemoccult-negative stool.  EXTREMITIES:  No edema.   IMPRESSION:  1. A 77 year old white female with left-sided abdominal pain      suggestive of symptomatic diverticulosis, rule out diverticulitis,      rule out colon lesion.  Last colonoscopy in 1999.  2. Questionable history of abnormal head of the pancreas, not      confirmed on last CT scan within the last year.  3. History of gastroesophageal reflux, poorly controlled on Prilosec      20 mg a day.  The hiccups and some  dysphagia indicate that the      patient needs better coverage of her acid reflux.  4. Chronic constipation with some fecal incontinence.   PLAN:  1. MiraLax a half a cupful which is about 8 g daily or every-other      day.  2. Increase Prilosec to 20 mg p.o. b.i.d.  3. Colonoscopy as scheduled.  4. Depending on the results of the colonoscopy, she may also need      upper endoscopy.     [name]. Juanda Chance, MD  Electronically Signed    DMB/MedQ  DD: 06/08/2006  DT: 06/08/2006  Job #: [id number]   cc:   [name], M.D.  Willow Ora, MD

## 2010-10-17 NOTE — H&P (Signed)
NAME:  Denise, [name]                            ACCOUNT NO.:  [id number]   MEDICAL RECORD NO.:  [id number]                   PATIENT TYPE:  EMS   LOCATION:  MAJO                                 FACILITY:  [facility]   PHYSICIAN:  Marlan Palau, M.D.               DATE OF BIRTH:  [birth year]   DATE OF ADMISSION:  09/14/2003  DATE OF DISCHARGE:                                HISTORY & PHYSICAL   HISTORY OF PRESENT ILLNESS:  [name] is a 77 year old right-handed  white female born [birth year] with a history of hypertension, chronic  renal insufficiency who is followed by Dr. Ruthine Dose.  This patient comes into  the [facility] Emergency Room today following a referral by Dr. Drue Novel after  the patient visited him in his office today with a history with problems  with speech and gait that had developed upon awakening on the 14th of April  2005.  The patient has had some mild dysarthria, mild gait imbalance but has  not had any falls.  This has persisted into today and the patient sought  medical attention for this.  This patient had been on aspirin 81 mg a day  but stopped the medication 4 weeks ago because she had heard that it might  cause retinal hemorrhages.  The patient denies any numbness or tingling  sensations on the arms or legs that is new or different.  Has had some  chronic left foot numbness associated with lumbosacral spine problems for  which she has been seen and followed by Dr. Ethelene Hal.  The patient denies any  weakness of the extremities, denies any problems swallowing, denies headache  or visual field changes.  The patient denies any dizziness or blackout  episodes.  The patient was sent to the emergency room.  A CT of the head is  pending at this time.   PAST MEDICAL HISTORY:  1. History of a new onset slurred speech and gait abnormality.  2. Hypertension.  3. Chronic renal insufficiency.  4. Hypercholesterolemia.  5. Status post hysterectomy.  6. History of floating  kidney on the right status post surgery.  7. History of surgery for urinary incontinence.  8. History of low back pain status post epidural steroid injections.   MEDICATIONS:  1. Metoprolol 100 mg daily.  2. Premarin 0.625 mg a day.  3. Celebrex 200 mg a day.  4. Hydrochlorothiazide 50 mg a day.  5. The patient claims she had been placed on two new blood pressure     medications that she cannot remember the names of.  6. The patient was on aspirin 81 mg day.  Just started it again yesterday.   The patient states ALLERGY to SULFA DRUGS, DEMEROL, PENICILLIN.   Does not currently smoke or drink.   SOCIAL HISTORY:  This patient is married, lives in the [place] area.  Husband is legally blind.  The patient has one son who does not live in this  area but is alive and well.   FAMILY MEDICAL HISTORY:  Notable that mother died from old age.  Father died  from an MI.  The patient has two sisters who are living.  Two brothers, one  died following a spinal cord injury, one died with complications of  diabetes.   REVIEW OF SYSTEMS:  Is notable for no fevers, chills.  The patient denies  neck pain, neck stiffness, and denies shortness of breath, chest pains,  nausea, vomiting, trouble controlling bowels or bladder.  The patient does  have some chronic low back pain, some leg discomfort as well.   PHYSICAL EXAMINATION:  VITAL SIGNS:  Blood pressure is 174/79, heart rate  53, respiratory rate 12, temperature afebrile.  GENERAL:  This patient is a fairly well-developed elderly white female who  is alert and cooperative at the time of examination.  HEENT:  Head is atraumatic.  Eyes:  Pupils equal, round, react to light.  Disks are flat bilaterally.  NECK:  Supple, no carotid bruits noted.  RESPIRATORY:  Clear.  CARDIOVASCULAR:  Regular rate and rhythm.  No obvious murmurs or rubs noted.  ABDOMEN:  Positive bowel sounds.  No organomegaly or tenderness is noted.  EXTREMITIES:  Without  significant edema.  NEUROLOGICAL:  Cranial nerves as above.  Facial symmetry is present.  The  patient has good sensation to face to pinprick and soft touch bilaterally.  Has good strength to facial muscles and the muscles of the head turning  shoulder shrug bilaterally.  Speech is fairly well enunciated, minimal  dysarthria is noted.  No aphasia is seen.  Again, full visual fields noted.  Motor testing was 5/5 strength in all fours.  Good symmetric motor tone is  noted throughout.  Sensory testing is intact to pinprick, soft tough,  vibratory sensation throughout.  The patient has fair finger-nose-finger,  toe-to-finger bilaterally.  No drift is seen.  The patient has good symmetry  to reflexes, toes are neutral bilaterally.  The patient was not ambulated.   Laboratory values notable for a white count 7.1, a hemoglobin of 12.2,  hematocrit 34.8, MCV of 93.7, platelets of 214.  INR of 0.9.  Sodium 138,  potassium 4.3, chloride of 106, CO2 24, glucose 93, BUN 28, creatinine 1.6,  calcium 8.9, total protein 6.3, albumin of 3.4, AST of 21, ALT of 13, alk  phos 67, total bili 0.7.   Chest x-ray and EKG are pending.  CT of the head is pending.   IMPRESSION:  1. New onset of mild dysarthria, gait disturbance, rule out cerebrovascular     infarction.  2. Hypertension.  3. Chronic renal insufficiency.   This patient had gone off aspirin therapy 4 weeks ago.  The patient also is  on Premarin and Celebrex at this point.  The patient does have a  longstanding history of hypertension.  The patient reports new symptoms that  have persisted over the last day and one-half, comes into the emergency room  for evaluation.  The patient likely has had a small subcortical  cerebrovascular infarction.  May consider admission overnight for a brief  workup.   PLAN:  1. MRI scan of the brain.  2. MRI angiogram of intracranial and extracranial vessels. 3. 2-D echocardiogram as an outpatient.  4. Restart  aspirin therapy.  5. Monitor bed.  Will follow the patient's clinical course while in house.  Marlan Palau, M.D.    CKW/MEDQ  D:  09/14/2003  T:  09/15/2003  Job:  [id number]   cc:   Angelena Sole, M.D. [facility]   Wanda Plump, MD [facility]  [phone] [address] [place], [place] [address]

## 2010-10-17 NOTE — Discharge Summary (Signed)
[name], [name]                  ACCOUNT NO.:  [id number]   MEDICAL RECORD NO.:  [id number]          PATIENT TYPE:  INP   LOCATION:  3733                         FACILITY:  [facility]   PHYSICIAN:  Thomos Lemons, D.O. [facility]   DATE OF BIRTH:  [birth year]   DATE OF ADMISSION:  07/21/2004  DATE OF DISCHARGE:  07/26/2004                                 DISCHARGE SUMMARY   DISCHARGE DIAGNOSES:  1.  Hypotension, presumed secondary to volume depletion.  2.  Acute renal failure on chronic renal insufficiency secondary to      dehydration.  3.  Diarrhea.  4.  Normocytic anemia.  5.  Hypercholesterolemia.  6.  Hypertension.   DISCHARGE MEDICATIONS:  1.  Aspirin, enteric coated, 325 mg once daily.  2.  Lotrel 5/10 mg once daily.  3.  Lopressor 25 mg 1/2 tablet twice daily.  4.  Albuterol metered dose inhaler 2 puffs every 4 hours as needed.  5.  Levsin 0.125 mg sublingually before meals.   HOSPITAL COURSE:  #1.  The patient is a 77 year old white female who presented to Dr. Drue Novel'  office for generalized weakness and near syncope.  The patient was admitted  for observation.  The patient was found to have orthostatic hypotension as  well as acute renal failure.  The patient's status improved with IV  hydration.  Her kidney function returned to baseline.  Her creatinine today  before discharge was 1.5.  In regards to acute renal failure and diarrhea, a  CT scan was obtained during admission which showed no evidence of abdominal  aortic aneurysm or other acute abdominal process.  There was an  indeterminate lesion in the mid right kidney and right lobe of the liver.  Since this study was done without contrast, further studies with MRI was  recommended.  Also noted was mild pericardial thickening and small  pericardial effusion.  The CT scan of the abdomen and pelvis was within  normal limits.  A renal ultrasound was also ordered.  The impression was a 2  cm mass in the upper pole of the right kidney  concerning for possible solid  mass.  Recommendation was for an MRI for further evaluation. This is to be  arranged as an outpatient with Dr. Drue Novel.   #2.  GI consult was obtained due to patient's diarrhea.  The patient is  known to Dr. Juanda Chance and is followed for irritable bowel syndrome, felt the  patient's current status may be due to bowel ischemia; however, no  clinically significant ischemic colitis.  The patient was to resume Levsin  0.125 mg sublingually a.c.  C. difficile studies came back normal, and  Flagyl was subsequently discontinued.  In respect to her diarrhea, it was  felt that perhaps it was due to the antibiotics themselves, and all  antibiotics were discontinued at discharge.  The patient was initially on  Ceftin and changed to Cipro for UA that was positive for wbc's.   In summary, the patient is to follow up with Dr. Drue Novel in approximately one  week's time.  In addition,  she is to call Dr. Regino Schultze office for followup  appointment.  She will need an MRI as an outpatient to follow up on the  right renal mass.   Prognosis is fair.      RY/MEDQ  D:  07/26/2004  T:  07/27/2004  Job:  [id number]

## 2010-10-17 NOTE — Discharge Summary (Signed)
[name], [name]                  ACCOUNT NO.:  [id number]   MEDICAL RECORD NO.:  [id number]          PATIENT TYPE:  INP   LOCATION:  1605                         FACILITY:  [facility]   PHYSICIAN:  Denise Frankel. Charlann [name], M.D.  DATE OF BIRTH:  1925-08-30   DATE OF ADMISSION:  07/29/2005  DATE OF DISCHARGE:  08/03/2005                                 DISCHARGE SUMMARY   ADMISSION DIAGNOSES:  1.  Right femoral neck fracture.  2.  Laceration, right forehead.  3.  Hypertension.  4.  History of renal insufficiency.  5.  Gastroesophageal reflux disease.  6.  Remote history of cerebrovascular accident.   DISCHARGE DIAGNOSES:  1.  Right femoral neck fracture.  2.  Laceration, right forehead.  3.  Hypertension.  4.  History of renal insufficiency.  5.  Gastroesophageal reflux disease.  6.  Remote history of cerebrovascular accident.  7.  Mild postoperative anemia.  8.  Postoperative hypotension, corrected by discontinuing Norvasc.  9.  Mild urinary tract infection, treated with perioperative antibiotics.   CONSULTS:  Internal Medicine from [facility] group.   OPERATION:  On July 30, 2005, the patient underwent right total hip  replacement arthroplasty with DePuy component and cemented stem.  [name] assisted.   BRIEF HISTORY:  This 77 year old lady fell at home.  Tried to stand up from  a seated position, fell forward, losing her balance.  She also struck her  head.  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  This is a relatively active lady who cares for her  husband, and it was felt that she would benefit with a total hip due to the  arthritic changes in her acetabulum rather than a hemiarthroplasty.  After  the risks and benefits of surgery were described to the patient and after  clearance medically from the [facility], we proceeded with the above  procedure.   HOSPITAL COURSE:  The patient tolerated the surgical procedure quite well.  The [facility] hospitalist followed her very closely throughout her  hospitalization.  They noted the postoperative anemia, which was expected,  and treated it with transfusion.  They also adjusted her antihypertensive  medications, DC'ing the Norvasc, which took care of the hypotension, which  was seen postoperatively.  She had reached her baseline towards the end of  discharge.  It was felt that she could be discharged to [facility] for continuing rehabilitation so she could regain her level  of activity that she enjoyed prior to the above injury.   On the day of discharge, the wound was clean and dry.  No evidence of  infection.  Neurovascular was intact to the operative extremity.  We will  follow along with her at the [facility].   Laboratory values in the hospital hematologically showed a preoperative CBC  with a mild anemia.  RBC was 3.51.  Hemoglobin 11.2.  Hematocrit was 32.4.  This dropped postoperatively to a hemoglobin of 8.3  with hematocrit of 23.9.  After transfusion, she came up to 9.5 with a hematocrit of 27.3.  Blood  chemistries remained essentially normal.  She did have elevated BUN and  creatinine, which was suspected with her chronic renal insufficiency, and  the final BUN was 37 with a creatinine of 2.2.  Urinalysis showed a mild  urinary tract infection.   The CT of the head showed no acute findings.   Cervical spine showed an anterolisthesis of L4-5 with no fractures.   The hip x-ray showed the subcapital fracture, as mentioned above.   Electrocardiogram showed normal sinus rhythm.   Condition on transfer/discharge to rehab was improved and stable.   PLAN:  Patient is to be transferred to continue with her 50% weightbearing  of the operative extremity and rehabilitation program.  We will certainly  ask the [facility] to  follow along with [place] during this patient's  rehabilitation process.      [name].      [name], M.D.  Electronically Signed    DLU/MEDQ  D:  08/07/2005  T:  08/07/2005  Job:  [id number]

## 2010-10-17 NOTE — Discharge Summary (Signed)
[name], [name]                            ACCOUNT NO.:  [id number]   MEDICAL RECORD NO.:  [id number]                   PATIENT TYPE:  ORB   LOCATION:  4524                                 FACILITY:  [facility]   PHYSICIAN:  [name], M.D.             DATE OF BIRTH:  04-05-1926   DATE OF ADMISSION:  09/18/2003  DATE OF DISCHARGE:  09/28/2003                                 DISCHARGE SUMMARY   DISCHARGE DIAGNOSES:  1. Left pons cerebrovascular accident.  2. Hypertension.  3. Chronic renal insufficiency.  4. Hyperlipidemia.  5. Urinary incontinence.  6. Chronic low back pain.   HISTORY OF PRESENT ILLNESS:  A 77 year old right-handed female with a  history of hypertension and chronic renal insufficiency on September 14, 2003,  with slurred speech and gait disorder.  On evaluation, cranial CT scan was  negative.  MRI showed acute infarction of left side of pons.  MRA negative.  Echocardiogram with ejection fraction of 55-65% without embolism.  Neurology, [name], M.D., placed on aspirin therapy.  Her  intravenous heparin was discontinued on September 07, 2003.  She was supervision  for bed mobility and transfers.  She was admitted for a comprehensive  rehabilitation program.   PAST MEDICAL HISTORY:  1. Left pons cerebrovascular accident.  2. Hypertension.  3. Chronic renal insufficiency.  4. Hyperlipidemia.  5. Urinary incontinence.  6. Chronic low back pain.   HABITS:  She denies alcohol or tobacco.   ALLERGIES:  1. PENICILLIN.  2. SULFA.  3. DEMEROL.   MEDICATIONS PRIOR TO ADMISSION:  1. Lopressor 100 mg daily.  2. Premarin daily.  3. Celebrex 200 mg daily.  4. Hydrochlorothiazide 50 mg daily.   SOCIAL HISTORY:  [place]  She was independent prior to admission, living in a one-level hom.  Her  husband is legally blind with limited assisted.  A son in the area works.   HOSPITAL COURSE:  Patient with progressive  gains while on rehabilitation  services with therapies initiated daily.  The following issues were  following during the patient's rehabilitation course.  Pertaining to Ms.  [name]'s left cerebrovascular accident, she was maintained on aspirin therapy  and followed by the neurology service, [name], M.D.  She was  supervision for her mobility.  She exhibited no unsafe behavior.  Home  health therapies had been arranged.  Her blood pressures overall remained  controlled, although she did exhibit some mild renal insufficiency with  noted history per old records of chronic renal insufficiency with  creatinines of 1.7.  She did have a maximum creatinine of 1.9 and BUN of 51  on September 26, 2003.  Her hydrochlorothiazide was decreased to 25 mg on September 24, 2003.  She did receive intravenous fluids x 24 hours on September 26, 2003.  The latest BUN on September 27, 2003, of  37 and creatinine 1.6.  She would  remain on decreased doses of hydrochlorothiazide and follow up with her  primary M.D., Dr. Ruthine Dose.  She exhibited no bowel or bladder disturbance  through her rehabilitation course.  She had no swallowing difficulties per  evaluation by speech therapy.   The latest laboratories on September 27, 2003, showed a sodium of 138, potassium  3.5, BUN 37, and creatinine 1.6.  Latest TSH level of 0.933.  Latest CBC  with a hemoglobin of 11.8, hematocrit 33.5, WBC 6.8, and platelets 195,000.   DISCHARGE MEDICATIONS:  1. Foltx one tablet daily.  2. Norvasc 5 mg daily.  3. Lopressor 50 mg twice daily.  4. Premarin 0.625 mg daily.  5. Ecotrin 325 mg daily.  6. Multivitamins daily.  7. Hydrochlorothiazide 25 mg daily.  8. Tylenol as needed.   ACTIVITY:  As tolerated.   DIET:  Regular.   SPECIAL INSTRUCTIONS:  Home health physical and occupational therapy.  She  should follow up with [name], M.D., at the outpatient  rehabilitation service office as advised.  [name], M.D.,   neurology, called for appointment.      Mariam Dollar, P.A.                     [name], M.D.    DA/MEDQ  D:  09/27/2003  T:  09/27/2003  Job:  [id number]   cc:   [name], M.D.  [address]  [address]  [place]  [place] [address]  Fax: [phone]   Angelena Sole, M.D. [facility]

## 2010-10-17 NOTE — Op Note (Signed)
[name], [name]                  ACCOUNT NO.:  [id number]   MEDICAL RECORD NO.:  [id number]          PATIENT TYPE:  INP   LOCATION:  1605                         FACILITY:  [facility]   PHYSICIAN:  Madlyn Frankel. Charlann Boxer, M.D.  DATE OF BIRTH:  1926-03-28   DATE OF PROCEDURE:  07/30/2005  DATE OF DISCHARGE:                                 OPERATIVE REPORT   PREOPERATIVE DIAGNOSIS:  Displaced right femoral neck fracture associated  with osteoarthritis of the right hip with acetabular  changes.   POSTOPERATIVE DIAGNOSIS:  Displaced right femoral neck fracture associated  with osteoarthritis of the right hip with acetabular  changes.   PROCEDURE:  Right total hip replacement with DePuy component, size 50  Pinnacle cup, 2 cancellous bone screws, a 28 +4 10-degree high-walled liner,  a Summit basic cemented stem size 3 with a 28 +1.5 ball.   SURGEON:  Dr. Charlann Boxer   ASSISTANT:  Cherly Beach, PA-C   ANESTHESIA:  General.   BLOOD LOSS:  600 mL.   DRAINS:  X1.   COMPLICATIONS:  None.   INDICATION FOR PROCEDURE:  [name] is a pleasant 77 year old female, who  presented to the emergency room after falling at home.  She lives with her  husband who requires a significant amount of care.  She had tried to stand  up from a seated position when she fell forward, losing her balance.  She  hit her head, and this was ruled out and evaluated in the emergency room.   Radiographs of her hip, as she complains of hip pain, revealed a displaced  femoral neck fracture.  The patient was initially seen and evaluated by Dr.  Leonides Grills, one of my partners, and then admitted to my service.  Medical  consultation was obtained for perioperative evaluation, preoperative  clearance, and follow up.  The risks and benefits were reviewed and  discussed versus nonoperative postoperative measures and then hip  hemiarthroplasty versus total hip replacement in this 77 year old female.   Based on the radiographic  appearance and confirmed intraoperative findings,  total hip replacement was chosen based on the acetabular changes, loss of  catilage and sclerosis as well as cystic changes.   Consent was obtained following this discussion.   PROCEDURE IN DETAIL:  The patient was brought to the operative theatre.  Once adequate anesthesia and preoperative antibiotics, 1 g of Ancef, were  administered, the patient was positioned in the left lateral decubitus  position with the right side up.  Right lower extremity was then prepped and  draped in sterile fashion for hip replacement procedure.  A posterior  lateral incision was made for posterior approach to the hip.  The short  external rotators were taken down separate from the capsule.  The capsule  was saved and used to help protect against sciatic nerve retraction as well  as later repair to the superior leaflet.  The hip was dislocated, and neck  osteotomy was actually made into the trochanteric fossa to allow the removal  of the femoral head, and the femoral neck fracture appeared to be  somewhat  subcapital.  There was noted to be significant comminution with no density  and calcar trabecular bone.  Following removing the femoral head, attention  was directed to the acetabulum.  It was at this point, with thorough  irrigation, the patient was noted to have a significant superior lateral  acetabular wear with exposed bone.  The decision preoperatively to proceed  with total hip replacement was confirmed.   Acetabular exposure was obtained in a routine fashion.  Following exposure,  including __________, acetabular reaming commenced with a 43 reamer and  carried up to a 49 reamer with excellent bony bed preparation.  I then  impacted a final 50 mm Pinnacle cup.  Final cup position was 35-40 degrees  of abduction and 20 degrees of forward flexion beneath the anterior column.  Two cancellous bone screws were initially placed for some initial  stability,  and rotational support.   The trial liner was then positioned based on her offset off her preoperative  templating.  I chose to do a +4 offset based on the fact that this somewhat  basic stem had very little offset available.  Trial liner was positioned.   Following the placement of this trial liner, attention was directed to the  femur.  Femoral preparation was carried out per protocol which basically  amounts to broach only system.  I broached up to a size 3 to allow for trial  reduction.  I did not think a size 4 would fit distally.   Trial reduction was carried out, and I felt that the hip stability was  adequate.  There was some motion in the size 3 stem proximally further  supporting the need for cementing in this patient's case who had a type C  femur.   A cement restrictor was placed deep based off the measurement of the broach.  The canal was then prepared for third generation cement technique.  Note  that the trial liner was removed followed by placement of a final 28 +4 10  degree high-walled liner with a lipped position at approximately 9-10  o'clock posteriorly.   Cement was mixed and once the cement was prepared, the cement was then  injected into the canal in a retrograde fashion, and the final stem was  positioned with it held in approximately 20 degrees of anteversion based on  the tibia in the perpendicular position.  Once the cement had cured and  excessive cement removed, a trial reduction was again carried out.  Hip  stability was excellent with these components in place with approximately 1  mm of shuck.  Leg lengths appeared equal to the down leg as compared to the  preoperative state, despite shortening.  Tension on the muscle appeared to  be adequate.  The hip stability was excellent tolerating 80 degrees of  internal rotation at 80 degrees of flexion, neutral abduction prior to impingement on the post proximally.  It was also very stable in the  forward  position as well as extension and external rotation.  Given the parameters,  the final 28 +1.5 ball was impacted on the cleaned and dried trunnion and  the hip reduced.  The hip was irrigated throughout the case and again at  this point.  The posterior capsular leaflet was reapproximated superiorly.  Median Hemovac drain was placed deep.  The remainder of the wound was closed  in layers with staples on the skin.  The patient's wound was cleaned, dried,  and dressed sterilely with  Adaptic dressing, sponges, tape.  The patient was  then extubated and brought to the recovery room in stable condition.     Madlyn Frankel Charlann Boxer, M.D.  Electronically Signed    MDO/MEDQ  D:  07/30/2005  T:  07/31/2005  Job:  [id number]

## 2010-10-17 NOTE — H&P (Signed)
[name], BEHLKE NO.:  [id number]   MEDICAL RECORD NO.:  [id number]          PATIENT TYPE:  INP   LOCATION:  1829                         FACILITY:  [facility]   PHYSICIAN:  Wanda Plump, MD [facility]    DATE OF BIRTH:  [birth year]   DATE OF ADMISSION:  07/21/2004  DATE OF DISCHARGE:                                HISTORY & PHYSICAL   CHIEF COMPLAINT:  Weakness.   HISTORY OF PRESENT ILLNESS:  [name] is a 77 year old white female who  presented to the office today complaining of generalized weakness. As my  nurse was trying to get her vital signs the patient had a near syncope. We  put on the floor. She did not sustain any injury. For maybe 10 seconds she  would not answer any questions, but I do not think she actually lost  consciousness. I witnessed no seizure activity and there was no bowel or  bladder incontinence. The patient immediately was put on oxygen, we checked  hemoglobin and blood sugar and they were okay. She denies any chest pain or  headache. Shortly after the ambulance came and picked up Ms. Starlin to take  her to the hospital, she was feeling better already then. She was slightly  pale, but no diaphoresis.   PAST MEDICAL HISTORY:  1.  Hypertension.  2.  Osteopenia.  3.  High cholesterol.  4.  History of Raynaud's phenomenon.  5.  Hysterectomy and BSO.  6.  She had a stroke back in April 2005.  7.  She was evaluated by cardiology in November 2005 for dyspnea on      exertion. The cardiologist ordered a Cardiolite study which is      reportedly negative.  8.  Chronic renal insufficiency with the last creatinine about 1.7.  9.  Chronic back pain, under the care of Dr. Ranell Patrick, one of the orthopedic      physicians in town.  10. Chart review shows that she had a colonoscopy in 1999 and an ERCP in      1997, but at the time of this dictation I do not have the reports of      those procedures.   FAMILY HISTORY:  Noncontributory.   SOCIAL HISTORY:   Does not smoke.   REVIEW OF SYSTEMS:  She did have some nausea, vomiting, and diarrhea, but  she cannot quantify or describe at this time. She has been coughing and  having some sputum production. When all these symptoms started a few days  ago, she had a fever, but she has not been running further fevers. She does  admit to dizziness over the last few days. She has chronic back pain and  that seems to be unchanged and denies any abdominal pain, diarrhea, or blood  in the stools. She denies any lower extremity edema.   MEDICATIONS:  1.  Fosamax.  2.  Premarin 0.625 one p.o. daily.  3.  Foltx.  4.  Aspirin 325.  5.  Lopressor 25 one p.o. b.i.d.  6.  Lotrel 5/10 one p.o. daily.  7.  Motrin.   ALLERGIES:  She is allergic to PENICILLIN, SULFA, AND DEMEROL.   PHYSICAL EXAMINATION:  GENERAL: After the near syncope resolved, the patient  was alert, oriented, in no distress, coherent, and cooperative.  VITAL SIGNS: Blood pressure in the office was 110/100 with a pulse of 60.  LUNGS: She has abundant rhonchi throughout the chest without any wheezing or  crackles. There is no respiratory distress.  CARDIOVASCULAR: Regular rate and rhythm.  ABDOMEN: Nondistended, soft. She has good bowel sounds. She does have a  quite noticeable pulsatile mass that is slightly tender located at the left  flank. I hear no bruits when I put my stethoscope on top of these areas. The  femoral pulses are full bilaterally.  NEUROLOGIC: Speech is fluent at this time and the motor exam is symmetric.   LABORATORY AND X-RAYS:  Hemoglobin at the office was 13, blood sugar 116.  EKG shows sinus rhythm. The septal leads show T-wave inversion which is new,  I believe, compared to the previous EKGs.   ASSESSMENT/PLAN:  Ms. Vandoren has been admitted to the hospital with the  following problems:  1.  Near syncope.  2.  A few days history of cough, nausea, and vomiting. This could represent      pneumonia versus  influenza.  3.  Pulsatile mass on physical exam. I reviewed the records and I could not      find CT or ultrasound of the abdomen that could have assessed this      problem.  4.  Abnormal electrocardiogram.  5.  The patient will be admitted to ICU. Will check some chemistries and a      CBC. Will also rule out MI with cardiac enzymes. I went ahead and      consulted cardiology in reference to the abnormal EKG. Will also do a      STAT abdominal and pelvis CT without contrast to assess that pulsatile      mass. It seems that the patient is coughing, and I will go ahead and      start her empirically on Rocephin and Zithromax until chest x-ray is      done.  The case was discussed with Dr. Jonny Ruiz who is on call today. We      agree that he will re-assess the patient later on tonight. I wrote the      orders for them to call Dr. Jonny Ruiz with the results of the bloodwork, x-      rays, and CTs.      JEP/MEDQ  D:  07/21/2004  T:  07/21/2004  Job:  [id number]

## 2010-10-17 NOTE — Assessment & Plan Note (Signed)
MEDICAL RECORD NUMBER:  [id number]   INTERVAL HISTORY:  [name] is here in followup of her left pontine stroke.  She  complained of significant weakness and fatigue as well as depressed mood so  we decided to start her on Effexor XR 75 mg a day.  She had a friend who was  concerned about her starting this medication and she decided not to take it.  She has been involved in physical therapy and feels that her energy may be a  bit better.  She still has crying spells.  She does some activities around  the home.  She does feel weak at times and lightheaded.  Apparently she has  a workup ongoing for orthostasis by her family physician.  Appetite still  poor at times.  Sleep is stable about 8-10 hours a night.   REVIEW OF SYSTEMS:  The patient denies chest pain, shortness of breath,  cold, flu, wheezing or coughing symptoms.  She denies seizures, numbness,  spasms.  Does have weakness and dizziness at times.  Mood is labile but fair  according to her.  She denies headaches.  She denies nausea, vomiting,  reflux, diarrhea, constipation, bowel or bladder incontinence, weight gain,  fever, chills, or sweating.   PHYSICAL EXAMINATION:  On physical examination today blood pressure is  134/47 in the sitting position, pulse is 53, her respiratory rate is 18, she  is saturating 100% on room air.  Patient ambulates today for me and still  seems to favor the right side but essentially is stable.  She is able to  change directions without problem.  She shows no signs of lightheadedness or  fatigue when rising from a seated to standing position today.  Affect is  generally bright and appropriate.  Appearance is normal.   On further neurological examination cranial nerves II-XII are grossly  intact.  Speech is intelligible and only mild dysarthria is noted.  Sensory  function is intact throughout the right side.  I then measured her strength  at 4+/5 on the right compared to 5/5 on the left side.  Reflexes  are 2++ on  the right side.  Cognitively the patient is a bit perseverative and seems to  lack attention and possibly memory at times.   ASSESSMENT:  1.  Status post left pontine cerebrovascular accident.  2.  Hypertension.  3.  Fatigue and questionable reactive depression.  4.  Chronic low back pain.   PLAN:  1.  Patient seems to be getting good care for her back through Drs. Elsner      and Ramos.  She may continue through them.  2.  I have nothing further to offer her from a functional standpoint.  She      can follow up with her family doctor for her ongoing medical needs and      orthostatic workup.  I did encourage ongoing exercise and activity and      development of a regular exercise plan and activity plan.  3.  I am happy to see her back on an as-needed basis in the future.       ZTS/MedQ  D:  02/26/2004 15:22:03  T:  02/26/2004 17:40:18  Job #:  [id number]   cc:   Angelena Sole, M.D. [facility]

## 2010-10-17 NOTE — Op Note (Signed)
[place]  Patient:    [name]                          MRN: [id number] Proc. Date: 05/12/99 Adm. Date:  [id number] Attending:  Twana First                           Operative Report  PREOPERATIVE DIAGNOSIS:  Left foot second, third and fourth claw toes.  POSTOPERATIVE DIAGNOSIS:  Left foot second, third and fourth claw toes.  OPERATION PERFORMED: 1. Left second, third and fourth claw toe interphalangeal joint resection  arthroplasties. 2. Left second, third and fourth metatarsophalangeal joint dorsal capsulotomies    with pinning.  SURGEON:  Elana Alm. Thurston Hole, M.D.  ASSISTANT:  Kirstin Adelberger, P.A.  ANESTHESIA:  Ankle block/general.  OPERATIVE TIME:  One hour.  COMPLICATIONS:  None.  INDICATIONS FOR PROCEDURE:  [name] is a 77 year old woman who has had significant left foot pain for the past year increasing in nature with left second, third and fourth claw toes with dorsal calluses and pain.  She has failed conservative care and is now to undergo clawtoe and IP joint resection arthroplasties and MTP joint dorsal capsulotomies.  DESCRIPTION OF PROCEDURE:  [name] was brought to the operating room on May 12, 1999 after an ankle block had been placed in the holding room. Her left foot and leg was prepped using sterile Betadine and draped using sterile technique. A four-inch Esmarch was used as an ankle tourniquet.  On initial exposure to the second toe, she still was found to have significant lack of good block and thus she was converted to general anesthesia.  She also received Ancef 1 gm IV preoperatively for prophylaxis.  Initial incision 4 cm dorsal longitudinal incision based over the second toe IP joint and curved over into the second webspace between the second and third MTP joints was made.  The underlying subcutaneous tissues ere incised in line with the skin incision.  The IP joint to the second  toe dorsal extensor tendon was excised down to the joint.  The distal one fourth of the middle phalanx was removed with a bone cutter such that the IP joint could be easily extended into full extension.  After this was done, then the dorsal aspect of the MTP joint of the second toe was exposed and a Z-plasty made in the extensor tendon over the MTP joint dorsal capsule.  Capsule was resected.  Minimal articular cartilage damage was noted.  But after this was done the MTP joint could be easily brought down into an anatomic position which was approximately 10 to 15 degrees of dorsiflexion.  After this was done, then a 0.62 K-wire was placed across the DIP, PIP, and MTP joint of the second toe under fluoroscopic control holding this in an anatomic position.  After this was done, then the third toe PIP joint was ____________ .  An elliptical incision was made dorsally excising the skin, subcutaneous tissues and dorsal capsule down to the PIP joint.  The distal one fourth of the middle phalanx was resected so that the joint could easily be brought out into extension.  Through the previous incision, the third MTP joint was exposed, a Z-plasty made in this extensor tendon, the dorsal capsule resected and then another 0.62 K-wire drilled across the DIP, PIP, and MTP joint holding this in an anatomic  position and under fluoroscopic control.  The fourth toe then had a  4 cm longitudinal incision made from the PIP joint down to the MTP joint. Again, a dorsal incision excising the extensor tendon and joint capsule to the PIP joint was made and the distal one fourth of the middle phalanx resected and again, a Z-plasty made in the extensor tendon over the MTP joint and dorsal capsule excised and again a 0.45 K-wire used in this case across the DIP, PIP, and MTP joints thus holding this in an anatomic position and again confirmed by fluoroscopy.  After this was done, it was felt that all  the toes had been satisfactorily corrected.  The wounds were copiously irrigated.  The Z-lengthening of the extensor tendons over the second, third and fourth MTP joints were then repaired with 2-0 Vicryl suture. The skin was then closed with 4-0 nylon.  Pins were covered at the tips with pin balls. Sterile dressings were applied and then the 4 inch Esmarch tourniquet released nd then the patient awakened and taken to the recovery room in stable condition. Needle and sponge count correct x 2 at end of the case.  FOLLOW-UP:  [name] will be followed as an outpatient on Percocet for pain. ee her back in the office in a week for wound check and follow-up. DD:  05/12/99 TD:  05/12/99 Job: [id number]

## 2010-10-17 NOTE — Assessment & Plan Note (Signed)
[facility]                         GASTROENTEROLOGY OFFICE NOTE   KOOPER, CHRISWELL                         MRN:          [id number]  DATE:07/02/2006                            DOB:          03/20/1926    Ms. Lieb is a delightful, 77 year old white female with the history of  gastroesophageal reflux, esophageal stricture, prominent head of the  pancreas, followed for the past ten years, without any radiographic  change.  She also has chronic constipation.  We have done previous  colonoscopy on her as early as 75.  Last colonoscopy on June 10, 2006, showed two small hyperplastic polyps, which were removed.  She has  diverticulosis of the left colon.  The patient has responded to MiraLax  17 g two to three times a week, in combination with Colace on daily  basis.  She is having regular bowel movements.  Her complaints today are  more musculoskeletal, related to past fracture of the right hip.   MEDICATIONS:  1. Metoprolol 25 mg p.o. b.i.d.  2. Lotrel 5/10 p.o. daily.  3. Aspirin 325 p.o. daily.  4. Omeprazole 20 mg p.o. daily.  5. Colace.  6. Lasix 20 mg p.o. daily.  7. MiraLax.   PHYSICAL EXAMINATION:  Blood pressure 140/60, pulse 65, weight 135  pounds.  The patient was alert, oriented, in no distress.  LUNGS:  Clear to auscultation.  COR:  With normal S1, S2.  ABDOMEN:  Soft, nontender.   IMPRESSION:  1. The patient is a 77 year old white female with functional      constipation, responsive to MiraLax.  2. Diverticulosis on colonoscopy, no evidence of diverticulitis.  3. Two hyperplastic polyps.  Followup advised in seven years, but the      patient, at that point, will be 77 years old and I doubt that the      colonoscopy will be necessary.  4. Physiological enlargement of the head of the pancreas, very stable.      No further workup necessary.   PLAN:  The patient will continue the MiraLax and Colace.  She will  return on a  p.r.n. basis.    Hedwig Morton. Juanda Chance, MD  Electronically Signed   DMB/MedQ  DD: 07/02/2006  DT: 07/02/2006  Job #: [id number]   cc:   Willow Ora, MD

## 2010-10-17 NOTE — Discharge Summary (Signed)
[name], [name]                            ACCOUNT NO.:  [id number]   MEDICAL RECORD NO.:  [id number]                   PATIENT TYPE:  INP   LOCATION:  3733                                 FACILITY:  [facility]   PHYSICIAN:  Pramod P. Pearlean Brownie, MD                 DATE OF BIRTH:  12-31-1925   DATE OF ADMISSION:  09/14/2003  DATE OF DISCHARGE:  09/18/2003                                 DISCHARGE SUMMARY   DIAGNOSES AT TIME OF DISCHARGE:  1. 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  2. Hypertension.  3. Dyslipidemia.  4. Hyperhomocysteinemia.  5. Chronic renal insufficiency.  6. Status post hysterectomy.  7. Status post surgery for floating kidney.  8. Status post surgery for urinary incontinence.  9. History of low back pain status post epidural steroid injections.   MEDICATIONS AT TIME OF DISCHARGE:  1. Norvasc 5 mg daily.  2. Metoprolol 50 mg b.i.d.  3. Premarin 0.625 daily.  4. Hydrochlorothiazide 50 mg daily.  5. IV heparin per pharmacy protocol.  6. Aspirin daily.  7. Foltx daily.   STUDIES PERFORMED:  1. EKG showed sinus bradycardia with first degree AV block with occasional     premature complexes.  2. MRI showed acute stroke in the left pons with a subcentimeter acute     stroke in the left frontal cortex.  There is old small vessel disease     throughout the hemispheric white matter.  Old lacunar infarct in the left     thalamus.  3. MRA of the neck shows normal appearing inferior circulation with the     right vertebral artery dominant.  No sign of left vertebral artery.  4. MRA of the brain shows no large or medial vessel occlusion.  It is felt     that the left vertebral artery is supplied in a retrograde fashion from     the basilar, most likely relating to a congenital situation.  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  5. CT of the brain showed no acute abnormality, chronic small vessel white     matter disease.  6. Chest x-ray.  No active disease.  7. Two-D echocardiogram shows EF 55-65% with no LV wall abnormality, no     embolic source.   LABORATORY STUDIES:  Hemoglobin 11.7, hematocrit 33.5, white blood cell 7.4,  platelets 200.  Differential normal.  Coagulation studies normal.  Chemistry  with BUN 28, albumin 3.4, otherwise normal.  Homocysteine elevated at 20.30.  Lipids with cholesterol 202, triglycerides 217, HDL 56 and LDL 103.   HISTORY OF PRESENT ILLNESS:  [name] is a 77 year old right-handed white  female with a history of hypertension and chronic renal insufficiency.  She  presented to the [facility] Emergency Room following a referral by Dr. Drue Novel  after the patient visited him in his office today with a history of problems  with speech and gait that had developed upon awakening on September 13, 2003.  Patient had some mild dysarthria and gait imbalance but no falls.  Patient  had been on aspirin 81 mg a day but stopped the medicine four weeks ago  because she heard it might cause retinal hemorrhage.  She has some chronic  left foot numbness associated with lumbosacral spine for which she is seen  and followed by Dr. Ethelene Hal.  She denies any weakness in her extremities,  swelling or headache.  She was brought to the emergency room where she was  admitted to the hospital for further stroke workup.   HOSPITAL COURSE:  Findings did reveal acute infarct in the left pontine and  left frontal area felt secondary to small vessel disease with history of  hypertension and dyslipidemia as well as MRI supporting small vessel  disease.  Patient had stopped her aspirin prior to admission.  Will resume  aspirin 325 mg a day for secondary stroke prevention.  Patient was kept on  IV heparin until workup was nonrevealing for embolic source.    Therapy evaluated her and felt she would benefit from a short stay in rehab  prior to returning home.   Other risk factors identified were elevated homocysteine for which the  patient was started on Foltx.  Will continue statin.  Aggressive blood  pressure control was recommended to go up to 130/90 or less.   CONDITION AT DISCHARGE:  Patient alert and oriented x3.  Speech slow but  deliberate with minimal slurring.  No aphasia.  No focal cranial nerve or  motor weakness.  Gait is well-based and slightly ataxic.   DISCHARGE PLAN:  1. Discharge to subacute for rehab prior to returning home.  2. Aspirin for secondary stroke prevention.  3. Aggressive risk factor control.  4. Followup with Dr. Pearlean Brownie two months after discharge from rehab.      Annie Main, N.P.                         Pramod P. Pearlean Brownie, MD    SB/MEDQ  D:  09/21/2003  T:  09/23/2003  Job:  [id number]   cc:   Pramod P. Pearlean Brownie, MD  Fax: [phone]   Angelena Sole, M.D. [facility]   Richard D. Ethelene Hal, M.D.  [address]  [place]  [place] [address]  Fax: [phone]   Wanda Plump, MD [facility]  [phone] [address] [place], [place] [address]

## 2010-10-17 NOTE — H&P (Signed)
[name], [name]                  ACCOUNT NO.:  [id number]   MEDICAL RECORD NO.:  [id number]          PATIENT TYPE:  IPS   LOCATION:  4007                         FACILITY:  [facility]   PHYSICIAN:  Erick Colace, M.D.DATE OF BIRTH:  09-27-1925   DATE OF ADMISSION:  08/03/2005  DATE OF DISCHARGE:                                HISTORY & PHYSICAL   PRIMARY CARE PHYSICIAN:  Dr. Willow Ora   ORTHOPEDIC SURGEON:  Dr. Lajoyce Corners   REASON FOR ADMISSION:  Decline in self care, mobility following right  femoral neck fracture.   HISTORY:  A 77 year old female with history left pontine CVA in the past.  Was at [facility] April 2005.  More recently admitted to [facility]  July 29, 2005 with fall without loss of consciousness.  She sustained a  right femoral neck fracture.  She underwent a right THR July 30, 2005.  She  was placed on Coumadin for DVT prophylaxis and made partial weightbearing.  She had postoperative anemia with a hemoglobin drop to 8.3 and she was  transfused 2 units of packed red blood cells and her hemoglobin came up to  9.5.  She was followed by internal medicine for chronic renal insufficiency  and has a baseline creatinine 2.2-2.7.   REVIEW OF SYSTEMS:  Positive for reflux and urinary incontinence.   REVIEW OF SYSTEMS:  1.  Hypertension.  2.  GERD.  3.  Raynaud's phenomenon.  4.  Urinary incontinence.  5.  Kidney stone.  6.  Hysterectomy.  7.  Per patient, renal tumor.   SOCIAL HISTORY:  Lives with her elderly husband.  Husband needs assistance  with mobility and ADLs.  Son in area works.  One-level home, multiple entry  steps.   FUNCTIONAL HISTORY:  Independent with a cane prior to admission but she  notes that ever since her CVAs she really never got back to her baseline  status.  Current functional status:  Needs assistance with ADLs and  mobility.   HOME MEDICATIONS:  1.  Metoprolol 25 mg p.o. b.i.d.  2.  Lotrel 5/10 one p.o. daily.  3.   Omeprazole 20 mg p.o. daily.  4.  Aspirin 325 mg p.o. daily.  5.  Lorazepam 2.5 mg p.o. q.h.s.  6.  Lasix 40 mg p.o. daily.  7.  Omega 3 two p.o. daily.  8.  Equate 500 q.h.s.  9.  Vitamin E one p.o. q.h.s.   Last INR 2.7.  Last BUN 37, creatinine 2.2.   PHYSICAL EXAMINATION:  GENERAL:  Patient is a well-developed, well-nourished  elderly female in no acute distress.  HEENT:  Her head does have ecchymosis around the right periorbital area.  She has normal mood and affect.  External ENT normal.  VITAL SIGNS:  Blood pressure 144/61, pulse 71, respirations 18, temperature  97.5.  NECK:  Supple without adenopathy.  RESPIRATORY:  Effort is good.  Lungs clear.  HEART:  Regular rate and rhythm.  ABDOMEN:  Positive bowel sounds.  Soft, nontender to palpation.  EXTREMITIES:  No clubbing, cyanosis, edema.  She does have  some right thigh  swelling around the surgical site, but no evidence of erythema or drainage.  Her judgment, orientation, memory are all intact.  Affect is mildly anxious.  Sensation is normal.  NEUROLOGIC:  Manual muscle testing shows 4/5 bilateral deltoid, biceps,  triceps, finger flexors, 1/5 right hip flexor, quad, 4 TA, gastroc, 4 on the  left hip flexor, quad, TA, and gastroc.   IMPRESSION:  1.  Functional deficits due to right femoral neck fracture status post THR      July 30, 2005.  2.  Right hip pain.  Continue Robaxin and Vicodin.  3.  Deep venous thrombosis prophylaxis with Coumadin.  INR 2.7.  4.  Postoperative anemia.  Follow up CBC.  5.  Chronic renal insufficiency with baseline 2.2-2.7.  She is at her      baseline currently.  Monitor fluids and electrolytes per rehabilitation      nursing.  6.  Hypertension.  Continue Lotensin, Lasix, Lopressor per rehabilitation      nursing.  7.  Gastroesophageal reflux disease.  Continue Protonix.  8.  Urinary incontinence.  Check PVRs.  Evaluate for neurogenic bladder per      rehabilitation nursing.   PLAN:   Comprehensive intensive inpatient rehabilitation with PT, OT,  rehabilitation nursing.  PT will work on mobility.  OT will work on ADLs.  Estimated length of stay is two weeks.   Patient is a good rehabilitation candidate.  Goals are for intermittent  supervision level.      Erick Colace, M.D.  Electronically Signed     AEK/MEDQ  D:  08/03/2005  T:  08/04/2005  Job:  [id number]

## 2010-10-17 NOTE — Assessment & Plan Note (Signed)
MEDICAL RECORD NUMBER:  [id number]   Mrs. Freeburg is back regarding her left pontine stroke.  She has been doing  generally pretty well, but has some concerns over fatigue.  She feels that  she is sleeping through the night, but then is fatigued in the morning  hours.  She is also complaining of some weakness in the right leg and back  pain.  She has a long history of low back pain and was treated over at  [facility] for this.  The patient denies any new numbness,  spasms, or cognitive problems.  Speech has been good.  She occasionally has  difficulty swallowing liquids and needs extra time, but for the most part is  handling those well.  She tries to get out and perform activities outside  the home, but notes that she is not able to do these as long as she once  did.  The patient is enrolled in outpatient therapy and is to begin this in  the coming one to two weeks.  I had seen her husband last week for bilateral  peripheral neuropathy pain.   The patient does not rate her pain today at this visit, although she states  that it does improve with ice and rest.  She uses Ultram 50 mg in the  morning to help get her going.   REVIEW OF SYSTEMS:  The patient denies any chest pain, shortness of breath,  cough, and cold or flu symptoms.  She denies seizures, numbness, dizziness,  spasm, stroke, or confusion.  Denies problems with sleep, anxiety, mood, and  agitation.  Denies nausea, vomiting, and urinary retention or frequency.  Denies any incontinence.  She denies fever, chills, weight changes,  swelling, or sweating.   PHYSICAL EXAMINATION:  On physical examination today, her blood pressure is  133/59, the pulse is 62, the respiratory rate is 18, and she is saturating  99% on room air.  The patient walks with a fairly stable gait.  The right  leg seems to shuffle a bit, although it is usually clear without difficulty.  Her affect is appropriate, although a little anxious.  She is  well kept.  On  neurologic examination, cranial nerves II-XII are grossly intact.  Sensory  exam was nonfocal.  She had trace weakness in the right upper extremity at 4  to 4+/5 and the right lower extremity was 4+ to 5/5.  Reflexes were  hyperactive on the right more so than the left.  On examination of the  pelvis, there was some elevation of the right hemipelvis.  She had a mild  thoracolumbar scoliosis of 3-4 degrees to the left.  She had pain with  palpation of the right trochanter region, as well as to a moderate extent  the lumbar facets.   ASSESSMENT:  1. Status post left pontine cerebrovascular accident.  2. Hypertension.  3. Chronic low back pain related to levoscoliosis.   PLAN:  1. The patient's fatigue is really expectable at this point.  I recommend     that she try coming off of the tramadol a day or two to see if energy     increases.  Her other medications are really required for control of     blood pressure.  We did recommend that she go to outpatient physical     therapy to work on gait and further low back range of motion and     flexibility.  She probably will not need extensive therapy at  the     outpatient center.  I do feel that she needs to realize some of her     limitations from an energy standpoint.  She will need to take rest breaks     as needed, as well as an occasional nap.  2. The patient will follow up with Dr. Danielle Dess regarding her low back pain.     A followup appointment was scheduled through her orthopedic doctors.  3. I will see the patient back in approximately two months' time.  Could     consider a right greater trochanter injection if pain there does not     subside.    Ranelle Oyster, M.D.   ZTS/MedQ  D:  11/06/2003 15:06:57  T:  11/06/2003 16:00:22  Job #:  [id number]

## 2010-10-17 NOTE — H&P (Signed)
[name], [name]                  ACCOUNT NO.:  [id number]   MEDICAL RECORD NO.:  [id number]          PATIENT TYPE:  INP   LOCATION:  1605                         [facility]ACILITY[facility]  [facility]   PHYSICIAN:  Madlyn Frankel. Charlann Boxer, M.D.  DATE OF BIRTH:  15-Apr-1926   DATE OF ADMISSION:  07/29/2005  DATE OF DISCHARGE:                                HISTORY & PHYSICAL   CHIEF COMPLAINT:  Right hip pain and a head laceration status post a fall.   HISTORY OF PRESENT ILLNESS:  The patient is a 77 year old female who fell  off a stool onto her right hip and also injured her forehead. She denies any  loss of consciousness. Her only complaint is of pain to the forehead and  also to her right hip. The patient denied any other injuries to her  bilateral upper extremities or her left lower extremity.   PAST MEDICAL HISTORY:  1.  History of renal insufficiency.  2.  Gastroesophageal reflux disease.  3.  Hypertension.  4.  History of cerebrovascular accident.   FAMILY HISTORY:  Coronary artery disease.   SOCIAL HISTORY:  She is a patient of Dr. Willow [name]. She is retired. No  alcohol or smoking. She takes care of her 101 year old husband at home alone.   ALLERGIES:  PENICILLIN, SULFA AND DEMEROL.   MEDICATIONS:  1.  Metoprolol 25 mg p.o. b.i.d.  2.  Lotrel 5/10 mg p.o. daily.  3.  Omeprazole 20 mg p.o. daily.  4.  Aspirin 325 mg daily.  5.  Lorazepam 2.5 mg p.o. q.h.s.  6.  Lasix 40 mg p.o. daily.  7.  Mega-3 two tablet daily.  8.  Equate PM 500 mg at h.s.  9.  Vitamin E q.h.s.  10. Gas-Ex q.h.s.   REVIEW OF SYSTEMS:  History of right shoulder pain treated by Dr. Ranell Patrick.   PHYSICAL EXAMINATION:  VITAL SIGNS:  Temperature 97.9, pulse 56,  respirations 18, blood pressure 148/52.  GENERAL:  This is a healthy appearing 77 year old female in no acute  distress. Pleasant mood and affect. Alert and oriented x3.  HEENT EXAM:  Shows moderate ecchymosis to the right forehead in eyebrow area  with full  extraocular movements of eyes bilaterally. Pupils are equal, round  and reactive to light.  NECK:  Shows full range of motion with no tenderness.  CHEST:  Active breath sounds bilaterally with no wheezes, rhonchi or rales.  HEART:  Regular rate and rhythm, no murmur.  ABDOMEN:  Nontender, nondistended with active bowel sounds in all four  quadrants.  EXTREMITIES:  Shows the right leg is externally rotated and shortened but  neuovascularly she is intact distally.  SKIN:  Shows no rashes but the previously mentioned laceration to the right  forehead.  NEUROLOGIC:  Neurovascularly intact bilateral upper extremities and  bilateral lower extremities.   X-rays show a right femoral neck fracture.   IMPRESSION:  1.  Right femoral neck fracture.  2.  Laceration of right forehead.   PLAN:  Admit to Dr. Charlann Boxer for hemiarthroplasty scheduled for tomorrow. Also  suture versus Dermabond repair  to the forehead to be completed here in the  emergency department.      Thomas B. Durwin Nora, P.A.      Madlyn Frankel Charlann Boxer, M.D.  Electronically Signed    TBD/MEDQ  D:  07/29/2005  T:  07/29/2005  Job:  [id number]

## 2010-10-20 ENCOUNTER — Encounter: Payer: Self-pay | Admitting: Internal Medicine

## 2010-10-20 ENCOUNTER — Ambulatory Visit (INDEPENDENT_AMBULATORY_CARE_PROVIDER_SITE_OTHER): Payer: Medicare Other | Admitting: Internal Medicine

## 2010-10-20 ENCOUNTER — Encounter (HOSPITAL_COMMUNITY): Payer: Medicare Other

## 2010-10-20 DIAGNOSIS — N390 Urinary tract infection, site not specified: Secondary | ICD-10-CM

## 2010-10-20 DIAGNOSIS — M109 Gout, unspecified: Secondary | ICD-10-CM

## 2010-10-20 DIAGNOSIS — I10 Essential (primary) hypertension: Secondary | ICD-10-CM

## 2010-10-20 DIAGNOSIS — E875 Hyperkalemia: Secondary | ICD-10-CM

## 2010-10-20 MED ORDER — CIPROFLOXACIN HCL 250 MG PO TABS: 250.0000 mg | ORAL_TABLET | Freq: Two times a day (BID) | ORAL | Status: DC

## 2010-10-20 MED ORDER — COLCHICINE 0.6 MG PO TABS: ORAL_TABLET | ORAL | Status: DC

## 2010-10-22 ENCOUNTER — Telehealth: Payer: Self-pay | Admitting: *Deleted

## 2010-10-22 ENCOUNTER — Other Ambulatory Visit: Payer: Self-pay | Admitting: Internal Medicine

## 2010-10-22 DIAGNOSIS — M79673 Pain in unspecified foot: Secondary | ICD-10-CM

## 2010-10-22 NOTE — Telephone Encounter (Signed)
Pt is having severe pain in the entire leg with a swollen foot that is red and painful.  Worse than yesterday.

## 2010-10-22 NOTE — Telephone Encounter (Signed)
Pt states that if Dr. Rodena Medin thinks she should see Rheumatology then she will. Pt c/o being in severe pain.

## 2010-10-22 NOTE — Telephone Encounter (Signed)
Referral placed for rheumatology appt next available

## 2010-10-22 NOTE — Telephone Encounter (Signed)
On the earlier call pt was advised to take the vicodin that is listed in her chart, if she has some at home, and to go to the ED if she feels like the pain is too severe to handle. Pt declined ED and stated that she has 5-325 mg percocet at home and took one this morning for her back but it did not help her foot.    Attempted to call pt back to advise her, per Dr. Rodena Medin, that she can take 1 1/2 percocet tabs and if pain does not decrease or gets worse pt should go to the ED. Phone rang continuously and not answering machine ever picked up

## 2010-10-23 ENCOUNTER — Encounter (HOSPITAL_BASED_OUTPATIENT_CLINIC_OR_DEPARTMENT_OTHER): Payer: Medicare Other | Admitting: Oncology

## 2010-10-23 ENCOUNTER — Other Ambulatory Visit: Payer: Self-pay | Admitting: Oncology

## 2010-10-23 DIAGNOSIS — D709 Neutropenia, unspecified: Secondary | ICD-10-CM

## 2010-10-23 DIAGNOSIS — D4620 Refractory anemia with excess of blasts, unspecified: Secondary | ICD-10-CM

## 2010-10-23 DIAGNOSIS — N189 Chronic kidney disease, unspecified: Secondary | ICD-10-CM

## 2010-10-23 LAB — CBC WITH DIFFERENTIAL/PLATELET
Eosinophils Absolute: 0 10*3/uL (ref 0.0–0.5)
MCV: 101.7 fL — ABNORMAL HIGH (ref 79.5–101.0)
MONO%: 1.4 % (ref 0.0–14.0)
NEUT#: 0.6 10*3/uL — ABNORMAL LOW (ref 1.5–6.5)
RBC: 2.82 10*6/uL — ABNORMAL LOW (ref 3.70–5.45)
RDW: 23.9 % — ABNORMAL HIGH (ref 11.2–14.5)
WBC: 1.8 10*3/uL — ABNORMAL LOW (ref 3.9–10.3)
lymph#: 1.2 10*3/uL (ref 0.9–3.3)

## 2010-10-24 ENCOUNTER — Encounter (HOSPITAL_COMMUNITY): Payer: Medicare Other

## 2010-10-24 ENCOUNTER — Encounter: Payer: Self-pay | Admitting: Internal Medicine

## 2010-10-24 DIAGNOSIS — N390 Urinary tract infection, site not specified: Secondary | ICD-10-CM | POA: Insufficient documentation

## 2010-10-24 NOTE — Assessment & Plan Note (Signed)
Asymptomatic elevation with likely contribution from pain. Continue to monitor closely

## 2010-10-24 NOTE — Assessment & Plan Note (Signed)
Begin abx per pmd. Followup closely if no improvement or worsening.

## 2010-10-24 NOTE — Progress Notes (Signed)
  Subjective:    Patient ID: [name], female    DOB: Jul 06, 1925, 77 y.o.   MRN: [id number]  HPI Pt presents to clinic for evaluation of foot pain. Notes several day h/o right 1st toe pain with associated redness, mild swelling and warmth. No associated injury/trauma, break in skin, fever or chills. Notes h/o gout in the past affecting typically the same area. Has attempted two courses of po steroids within the last 60 days for foot pain and has chronic renal insufficiency appropriately avoiding nsaids. Pain is even to light touch, is avoiding a shoe on that foot but is able to wt bear and ambulate.   Reviewed recent urine cx results. PMD requested cipro be called in as pt has allergies to several abx with better sensitivities. Reviewed stable creatinine with minimally high K.   Reviewed pmh, medications and allergies  Review of Systems see hpi     Objective:   Physical Exam  Nursing note and vitals reviewed. Constitutional: She appears well-developed and well-nourished.  HENT:  Head: Normocephalic and atraumatic.  Musculoskeletal:       Right 1st toe base with erythema, mild st swelling and warmth. +tender to touch. No wound. Able to wt bear and ambulate without assistance.  Neurological: She is alert.  Skin: Skin is warm and dry. No rash noted. She is not diaphoretic. There is erythema.  Psychiatric: She has a normal mood and affect.          Assessment & Plan:

## 2010-10-24 NOTE — Assessment & Plan Note (Signed)
S/p recent po steroids and avoiding nsaids due to cri. Attempt colchicine reduced dose frequency (due to cri) and narcotic analgesia pt has at home prn. Consider rheumatology consult.

## 2010-10-24 NOTE — Assessment & Plan Note (Signed)
Mild, stable and asx. Scheduled for repeat chem7 per pmd

## 2010-10-28 ENCOUNTER — Telehealth: Payer: Self-pay | Admitting: *Deleted

## 2010-10-28 MED ORDER — CEPHALEXIN 500 MG PO CAPS: 500.0000 mg | ORAL_CAPSULE | Freq: Two times a day (BID) | ORAL | Status: AC

## 2010-10-28 NOTE — Telephone Encounter (Signed)
pls call and discuss allergies.  Urine cx sensitive to cefazoline so could possibly attempt keflex 500mg  po bid x 5days but has pcn allergy. Small percentage of pts allergic to pcn will have rxn to keflex type. If pcn allergy is rash and not anaphylaxis, tongue swelling or dyspnea may be ok to try.

## 2010-10-28 NOTE — Telephone Encounter (Signed)
Pt states that she gets a rash from pcn. Rx for keflex sent to pharm

## 2010-10-28 NOTE — Telephone Encounter (Signed)
Pt is having symptoms of another UTI, but cannot come in......Marland Kitchenfeels too bad to come in, but she states the Cipro is not helped this UTI.

## 2010-11-05 ENCOUNTER — Telehealth: Payer: Self-pay | Admitting: Internal Medicine

## 2010-11-05 NOTE — Telephone Encounter (Signed)
OK to refill Phenergan 12.5 mg, #20, 1 po q4-6 hrs prn nausea, also will need an OV

## 2010-11-05 NOTE — Telephone Encounter (Signed)
Spoke with patient and she states she has "not been feeling good." States she is having nausea and weakness for several weeks. States she can hear her stomach making a noise. States she finished up antibiotics for UTI last week. Denies vomiting, diarrhea or pain. Took Phenergan 25 mg 1/2 tab x 1 yesterday. States it did help a little bit. She is not sure who gave her the rx for Phenergan and thinks it may have been her husbands medication.hx GERD. diverticulosis

## 2010-11-06 ENCOUNTER — Encounter (HOSPITAL_BASED_OUTPATIENT_CLINIC_OR_DEPARTMENT_OTHER): Payer: Medicare Other | Admitting: Oncology

## 2010-11-06 ENCOUNTER — Other Ambulatory Visit: Payer: Self-pay | Admitting: Medical

## 2010-11-06 ENCOUNTER — Encounter (HOSPITAL_COMMUNITY)
Admission: RE | Admit: 2010-11-06 | Discharge: 2010-11-06 | Disposition: A | Discharge status: Home / Self Care | Payer: Medicare Other | Source: Ambulatory Visit | Attending: Medical | Admitting: Medical

## 2010-11-06 ENCOUNTER — Other Ambulatory Visit: Payer: Self-pay | Admitting: Oncology

## 2010-11-06 DIAGNOSIS — D61818 Other pancytopenia: Secondary | ICD-10-CM

## 2010-11-06 DIAGNOSIS — D649 Anemia, unspecified: Secondary | ICD-10-CM | POA: Insufficient documentation

## 2010-11-06 DIAGNOSIS — D696 Thrombocytopenia, unspecified: Secondary | ICD-10-CM

## 2010-11-06 DIAGNOSIS — D4620 Refractory anemia with excess of blasts, unspecified: Secondary | ICD-10-CM

## 2010-11-06 LAB — CBC WITH DIFFERENTIAL/PLATELET
Basophils Absolute: 0 10*3/uL (ref 0.0–0.1)
Eosinophils Absolute: 0.1 10*3/uL (ref 0.0–0.5)
HGB: 8.1 g/dL — ABNORMAL LOW (ref 11.6–15.9)
LYMPH%: 75.3 % — ABNORMAL HIGH (ref 14.0–49.7)
MONO#: 0 10*3/uL — ABNORMAL LOW (ref 0.1–0.9)
NEUT#: 0.5 10*3/uL — ABNORMAL LOW (ref 1.5–6.5)
Platelets: 18 10*3/uL — ABNORMAL LOW (ref 145–400)
RBC: 2.42 10*6/uL — ABNORMAL LOW (ref 3.70–5.45)
RDW: 21.9 % — ABNORMAL HIGH (ref 11.2–14.5)
WBC: 2.2 10*3/uL — ABNORMAL LOW (ref 3.9–10.3)
nRBC: 0 % (ref 0–0)

## 2010-11-06 MED ORDER — PROMETHAZINE HCL 12.5 MG PO TABS: ORAL_TABLET | ORAL | Status: DC

## 2010-11-06 NOTE — Telephone Encounter (Signed)
Spoke with patient and gave her Dr. Regino Schultze recommendations. Rx sent to her pharmacy. Scheduled OV with Dr. Juanda Chance on 11/17/10 at 2:00PM

## 2010-11-07 ENCOUNTER — Encounter (HOSPITAL_BASED_OUTPATIENT_CLINIC_OR_DEPARTMENT_OTHER): Payer: Medicare Other | Admitting: Oncology

## 2010-11-07 DIAGNOSIS — D4620 Refractory anemia with excess of blasts, unspecified: Secondary | ICD-10-CM

## 2010-11-09 LAB — CROSSMATCH
ABO/RH(D): O NEG
Unit division: 0

## 2010-11-10 ENCOUNTER — Other Ambulatory Visit: Payer: Self-pay | Admitting: Internal Medicine

## 2010-11-10 MED ORDER — CEPHALEXIN 500 MG PO CAPS: 500.0000 mg | ORAL_CAPSULE | Freq: Two times a day (BID) | ORAL | Status: AC

## 2010-11-10 NOTE — Telephone Encounter (Signed)
Pt needs refill on cephalexin 500mg  call int [facility] [phone]. Pt stated she still has uti.

## 2010-11-10 NOTE — Telephone Encounter (Signed)
Repeat keflex x 5d

## 2010-11-10 NOTE — Telephone Encounter (Signed)
done

## 2010-11-17 ENCOUNTER — Ambulatory Visit: Payer: Medicare Other | Admitting: Internal Medicine

## 2010-11-20 ENCOUNTER — Encounter (HOSPITAL_BASED_OUTPATIENT_CLINIC_OR_DEPARTMENT_OTHER): Payer: Medicare Other | Admitting: Oncology

## 2010-11-20 ENCOUNTER — Other Ambulatory Visit: Payer: Self-pay | Admitting: Oncology

## 2010-11-20 DIAGNOSIS — D696 Thrombocytopenia, unspecified: Secondary | ICD-10-CM

## 2010-11-20 DIAGNOSIS — D61818 Other pancytopenia: Secondary | ICD-10-CM

## 2010-11-20 DIAGNOSIS — D4620 Refractory anemia with excess of blasts, unspecified: Secondary | ICD-10-CM

## 2010-11-20 LAB — CBC WITH DIFFERENTIAL/PLATELET
EOS%: 0.1 % (ref 0.0–7.0)
MCH: 35 pg — ABNORMAL HIGH (ref 25.1–34.0)
MCHC: 34.1 g/dL (ref 31.5–36.0)
MCV: 102.8 fL — ABNORMAL HIGH (ref 79.5–101.0)
MONO%: 2.1 % (ref 0.0–14.0)
RBC: 2.8 10*6/uL — ABNORMAL LOW (ref 3.70–5.45)
RDW: 24.9 % — ABNORMAL HIGH (ref 11.2–14.5)

## 2010-12-05 ENCOUNTER — Other Ambulatory Visit: Payer: Self-pay | Admitting: Internal Medicine

## 2010-12-05 NOTE — Telephone Encounter (Signed)
Pt called req ALPRAZolam (XANAX) 0.5 MG tablet ,HYDROcodone-acetaminophen (VICODIN) 5-500 MG per tablet ,folic acid (FOLVITE) 1 MG tablet, Pls call in to [facility] mail order pharmacy for 90 day supply [phone], pts mem id# [id number]

## 2010-12-08 ENCOUNTER — Other Ambulatory Visit: Payer: Self-pay | Admitting: Medical

## 2010-12-08 ENCOUNTER — Encounter (HOSPITAL_BASED_OUTPATIENT_CLINIC_OR_DEPARTMENT_OTHER): Payer: Medicare Other | Admitting: Oncology

## 2010-12-08 DIAGNOSIS — D696 Thrombocytopenia, unspecified: Secondary | ICD-10-CM

## 2010-12-08 DIAGNOSIS — D61818 Other pancytopenia: Secondary | ICD-10-CM

## 2010-12-08 DIAGNOSIS — N289 Disorder of kidney and ureter, unspecified: Secondary | ICD-10-CM

## 2010-12-08 DIAGNOSIS — D4620 Refractory anemia with excess of blasts, unspecified: Secondary | ICD-10-CM

## 2010-12-08 LAB — CBC WITH DIFFERENTIAL/PLATELET
Eosinophils Absolute: 0 10*3/uL (ref 0.0–0.5)
MCV: 105 fL — ABNORMAL HIGH (ref 79.5–101.0)
MONO%: 1.9 % (ref 0.0–14.0)
NEUT#: 0.6 10*3/uL — ABNORMAL LOW (ref 1.5–6.5)
RBC: 2.26 10*6/uL — ABNORMAL LOW (ref 3.70–5.45)
RDW: 26.4 % — ABNORMAL HIGH (ref 11.2–14.5)
WBC: 1.5 10*3/uL — ABNORMAL LOW (ref 3.9–10.3)

## 2010-12-08 LAB — HOLD TUBE, BLOOD BANK

## 2010-12-09 MED ORDER — ALPRAZOLAM 0.5 MG PO TABS: 0.5000 mg | ORAL_TABLET | Freq: Every evening | ORAL | Status: DC | PRN

## 2010-12-09 MED ORDER — HYDROCODONE-ACETAMINOPHEN 5-500 MG PO TABS: ORAL_TABLET | ORAL | Status: DC

## 2010-12-09 NOTE — Telephone Encounter (Signed)
rx sent in electronically 

## 2010-12-12 ENCOUNTER — Encounter (HOSPITAL_BASED_OUTPATIENT_CLINIC_OR_DEPARTMENT_OTHER): Payer: Medicare Other | Admitting: Oncology

## 2010-12-12 ENCOUNTER — Other Ambulatory Visit: Payer: Self-pay | Admitting: Oncology

## 2010-12-12 ENCOUNTER — Other Ambulatory Visit: Payer: Self-pay | Admitting: Medical

## 2010-12-12 ENCOUNTER — Encounter (HOSPITAL_COMMUNITY)
Admission: RE | Admit: 2010-12-12 | Discharge: 2010-12-12 | Disposition: A | Discharge status: Home / Self Care | Payer: Medicare Other | Source: Ambulatory Visit | Attending: Medical | Admitting: Medical

## 2010-12-12 DIAGNOSIS — D61818 Other pancytopenia: Secondary | ICD-10-CM

## 2010-12-12 DIAGNOSIS — D649 Anemia, unspecified: Secondary | ICD-10-CM | POA: Insufficient documentation

## 2010-12-12 LAB — CBC WITH DIFFERENTIAL/PLATELET
BASO%: 0.6 % (ref 0.0–2.0)
HCT: 22.7 % — ABNORMAL LOW (ref 34.8–46.6)
MCHC: 34.4 g/dL (ref 31.5–36.0)
MONO#: 0 10*3/uL — ABNORMAL LOW (ref 0.1–0.9)
NEUT%: 21.1 % — ABNORMAL LOW (ref 38.4–76.8)
RBC: 2.15 10*6/uL — ABNORMAL LOW (ref 3.70–5.45)
RDW: 26.5 % — ABNORMAL HIGH (ref 11.2–14.5)
WBC: 1.7 10*3/uL — ABNORMAL LOW (ref 3.9–10.3)
lymph#: 1.3 10*3/uL (ref 0.9–3.3)

## 2010-12-12 LAB — HOLD TUBE, BLOOD BANK

## 2010-12-13 LAB — CROSSMATCH
ABO/RH(D): O NEG
Antibody Screen: NEGATIVE
Unit division: 0

## 2010-12-15 ENCOUNTER — Other Ambulatory Visit: Payer: Self-pay | Admitting: Internal Medicine

## 2010-12-15 NOTE — Telephone Encounter (Signed)
Pt called and is req refill of ALPRAZolam (XANAX) 0.5 MG tablet take 1 at bed time as needed. Pls call in to [facility] [phone] 90 day supply.

## 2010-12-16 NOTE — Telephone Encounter (Signed)
Done on 12/09/10

## 2010-12-22 ENCOUNTER — Encounter (HOSPITAL_BASED_OUTPATIENT_CLINIC_OR_DEPARTMENT_OTHER): Payer: Medicare Other | Admitting: Oncology

## 2010-12-22 ENCOUNTER — Other Ambulatory Visit: Payer: Self-pay | Admitting: Medical

## 2010-12-22 DIAGNOSIS — D4620 Refractory anemia with excess of blasts, unspecified: Secondary | ICD-10-CM

## 2010-12-22 DIAGNOSIS — D696 Thrombocytopenia, unspecified: Secondary | ICD-10-CM

## 2010-12-22 LAB — CBC WITH DIFFERENTIAL/PLATELET
BASO%: 0.3 % (ref 0.0–2.0)
HCT: 26.9 % — ABNORMAL LOW (ref 34.8–46.6)
LYMPH%: 51.9 % — ABNORMAL HIGH (ref 14.0–49.7)
MCHC: 34.4 g/dL (ref 31.5–36.0)
MCV: 102 fL — ABNORMAL HIGH (ref 79.5–101.0)
MONO%: 0.9 % (ref 0.0–14.0)
NEUT%: 46.8 % (ref 38.4–76.8)
Platelets: 13 10*3/uL — ABNORMAL LOW (ref 145–400)
RBC: 2.64 10*6/uL — ABNORMAL LOW (ref 3.70–5.45)

## 2010-12-25 NOTE — Telephone Encounter (Signed)
TELEPHONE NOTE 

## 2010-12-30 ENCOUNTER — Other Ambulatory Visit: Payer: Self-pay | Admitting: Internal Medicine

## 2010-12-30 MED ORDER — HYDRALAZINE HCL 25 MG PO TABS: 25.0000 mg | ORAL_TABLET | Freq: Three times a day (TID) | ORAL | Status: DC

## 2010-12-30 MED ORDER — LISINOPRIL 40 MG PO TABS: 40.0000 mg | ORAL_TABLET | Freq: Every day | ORAL | Status: DC

## 2010-12-30 NOTE — Telephone Encounter (Signed)
rx sent in electronically 

## 2010-12-30 NOTE — Telephone Encounter (Signed)
Pt called req refill of lisinopril (PRINIVIL,ZESTRIL) 40 MG and hydrALAZINE (APRESOLINE) 25 MG to [facility] [phone].

## 2011-01-01 ENCOUNTER — Other Ambulatory Visit: Payer: Self-pay | Admitting: Medical

## 2011-01-01 ENCOUNTER — Encounter (HOSPITAL_BASED_OUTPATIENT_CLINIC_OR_DEPARTMENT_OTHER): Payer: Medicare Other | Admitting: Oncology

## 2011-01-01 ENCOUNTER — Encounter (HOSPITAL_COMMUNITY)
Admission: RE | Admit: 2011-01-01 | Discharge: 2011-01-01 | Disposition: A | Discharge status: Home / Self Care | Payer: Medicare Other | Source: Ambulatory Visit | Attending: Medical | Admitting: Medical

## 2011-01-01 DIAGNOSIS — D649 Anemia, unspecified: Secondary | ICD-10-CM | POA: Insufficient documentation

## 2011-01-01 DIAGNOSIS — D696 Thrombocytopenia, unspecified: Secondary | ICD-10-CM

## 2011-01-01 DIAGNOSIS — N289 Disorder of kidney and ureter, unspecified: Secondary | ICD-10-CM

## 2011-01-01 DIAGNOSIS — D4620 Refractory anemia with excess of blasts, unspecified: Secondary | ICD-10-CM

## 2011-01-01 LAB — CBC WITH DIFFERENTIAL/PLATELET
BASO%: 0.6 % (ref 0.0–2.0)
EOS%: 0.1 % (ref 0.0–7.0)
LYMPH%: 58.7 % — ABNORMAL HIGH (ref 14.0–49.7)
MCH: 36.1 pg — ABNORMAL HIGH (ref 25.1–34.0)
MCHC: 34.7 g/dL (ref 31.5–36.0)
MONO#: 0 10*3/uL — ABNORMAL LOW (ref 0.1–0.9)
NEUT%: 39.2 % (ref 38.4–76.8)
Platelets: 11 10*3/uL — ABNORMAL LOW (ref 145–400)
RBC: 2.21 10*6/uL — ABNORMAL LOW (ref 3.70–5.45)
WBC: 1.3 10*3/uL — ABNORMAL LOW (ref 3.9–10.3)
lymph#: 0.8 10*3/uL — ABNORMAL LOW (ref 0.9–3.3)

## 2011-01-01 LAB — URINALYSIS, MICROSCOPIC - CHCC
Blood: NEGATIVE
Glucose: NEGATIVE g/dL
Ketones: NEGATIVE mg/dL
Specific Gravity, Urine: 1.01 (ref 1.003–1.035)

## 2011-01-01 LAB — HOLD TUBE, BLOOD BANK

## 2011-01-02 ENCOUNTER — Encounter (HOSPITAL_BASED_OUTPATIENT_CLINIC_OR_DEPARTMENT_OTHER): Payer: Medicare Other | Admitting: Oncology

## 2011-01-02 DIAGNOSIS — D4620 Refractory anemia with excess of blasts, unspecified: Secondary | ICD-10-CM

## 2011-01-03 LAB — CROSSMATCH
ABO/RH(D): O NEG
Antibody Screen: NEGATIVE
Unit division: 0

## 2011-01-14 ENCOUNTER — Other Ambulatory Visit: Payer: Self-pay | Admitting: Oncology

## 2011-01-14 ENCOUNTER — Encounter (HOSPITAL_BASED_OUTPATIENT_CLINIC_OR_DEPARTMENT_OTHER): Payer: Medicare Other | Admitting: Oncology

## 2011-01-14 DIAGNOSIS — D4620 Refractory anemia with excess of blasts, unspecified: Secondary | ICD-10-CM

## 2011-01-14 DIAGNOSIS — D649 Anemia, unspecified: Secondary | ICD-10-CM

## 2011-01-14 LAB — CBC WITH DIFFERENTIAL/PLATELET
BASO%: 0.4 % (ref 0.0–2.0)
HCT: 24.2 % — ABNORMAL LOW (ref 34.8–46.6)
LYMPH%: 59.5 % — ABNORMAL HIGH (ref 14.0–49.7)
MCH: 34 pg (ref 25.1–34.0)
MCHC: 34.8 g/dL (ref 31.5–36.0)
MCV: 97.9 fL (ref 79.5–101.0)
MONO#: 0 10*3/uL — ABNORMAL LOW (ref 0.1–0.9)
NEUT%: 38.5 % (ref 38.4–76.8)
Platelets: 13 10*3/uL — ABNORMAL LOW (ref 145–400)
WBC: 1.4 10*3/uL — ABNORMAL LOW (ref 3.9–10.3)

## 2011-01-21 ENCOUNTER — Other Ambulatory Visit: Payer: Self-pay | Admitting: Oncology

## 2011-01-21 ENCOUNTER — Telehealth: Payer: Self-pay | Admitting: Internal Medicine

## 2011-01-21 ENCOUNTER — Encounter (HOSPITAL_BASED_OUTPATIENT_CLINIC_OR_DEPARTMENT_OTHER): Payer: Medicare Other | Admitting: Oncology

## 2011-01-21 DIAGNOSIS — D4620 Refractory anemia with excess of blasts, unspecified: Secondary | ICD-10-CM

## 2011-01-21 DIAGNOSIS — D696 Thrombocytopenia, unspecified: Secondary | ICD-10-CM

## 2011-01-21 LAB — CBC WITH DIFFERENTIAL/PLATELET
BASO%: 1.1 % (ref 0.0–2.0)
EOS%: 0.1 % (ref 0.0–7.0)
HGB: 8.3 g/dL — ABNORMAL LOW (ref 11.6–15.9)
MCH: 34.6 pg — ABNORMAL HIGH (ref 25.1–34.0)
MCHC: 35.1 g/dL (ref 31.5–36.0)
MONO#: 0 10*3/uL — ABNORMAL LOW (ref 0.1–0.9)
NEUT#: 0.4 10*3/uL — CL (ref 1.5–6.5)
NEUT%: 31.2 % — ABNORMAL LOW (ref 38.4–76.8)
Platelets: 11 10*3/uL — ABNORMAL LOW (ref 145–400)
RBC: 2.41 10*6/uL — ABNORMAL LOW (ref 3.70–5.45)
RDW: 27.4 % — ABNORMAL HIGH (ref 11.2–14.5)
lymph#: 0.8 10*3/uL — ABNORMAL LOW (ref 0.9–3.3)

## 2011-01-21 MED ORDER — OMEPRAZOLE 20 MG PO CPDR: 20.0000 mg | DELAYED_RELEASE_CAPSULE | Freq: Every day | ORAL | Status: DC

## 2011-01-21 NOTE — Telephone Encounter (Signed)
Pt needs a new Omeprazole Rx. She goes through [facility].

## 2011-01-22 LAB — CROSSMATCH
ABO/RH(D): O NEG
Unit division: 0

## 2011-01-28 ENCOUNTER — Encounter (HOSPITAL_BASED_OUTPATIENT_CLINIC_OR_DEPARTMENT_OTHER): Payer: Medicare Other | Admitting: Oncology

## 2011-01-28 ENCOUNTER — Other Ambulatory Visit: Payer: Self-pay | Admitting: Oncology

## 2011-01-28 DIAGNOSIS — D4620 Refractory anemia with excess of blasts, unspecified: Secondary | ICD-10-CM

## 2011-01-28 DIAGNOSIS — D696 Thrombocytopenia, unspecified: Secondary | ICD-10-CM

## 2011-01-28 DIAGNOSIS — D649 Anemia, unspecified: Secondary | ICD-10-CM

## 2011-01-28 LAB — CBC WITH DIFFERENTIAL/PLATELET
Basophils Absolute: 0 10*3/uL (ref 0.0–0.1)
EOS%: 0.5 % (ref 0.0–7.0)
Eosinophils Absolute: 0 10*3/uL (ref 0.0–0.5)
LYMPH%: 74.8 % — ABNORMAL HIGH (ref 14.0–49.7)
MCH: 33.3 pg (ref 25.1–34.0)
MCV: 96.3 fL (ref 79.5–101.0)
MONO%: 1.4 % (ref 0.0–14.0)
NEUT#: 0.3 10*3/uL — CL (ref 1.5–6.5)
Platelets: 11 10*3/uL — ABNORMAL LOW (ref 145–400)
RBC: 2.92 10*6/uL — ABNORMAL LOW (ref 3.70–5.45)

## 2011-02-10 ENCOUNTER — Other Ambulatory Visit: Payer: Self-pay | Admitting: Internal Medicine

## 2011-02-10 MED ORDER — FOLIC ACID 1 MG PO TABS: 1.0000 mg | ORAL_TABLET | Freq: Every day | ORAL | Status: AC

## 2011-02-10 NOTE — Telephone Encounter (Signed)
Refill Folic Acid to [facility]. Thanks.

## 2011-02-10 NOTE — Telephone Encounter (Signed)
rx sent in electronically 

## 2011-02-11 ENCOUNTER — Other Ambulatory Visit: Payer: Self-pay | Admitting: Oncology

## 2011-02-11 ENCOUNTER — Encounter (HOSPITAL_COMMUNITY)
Admission: RE | Admit: 2011-02-11 | Discharge: 2011-02-11 | Disposition: A | Discharge status: Home / Self Care | Payer: Medicare Other | Source: Ambulatory Visit | Attending: Medical | Admitting: Medical

## 2011-02-11 ENCOUNTER — Encounter (HOSPITAL_BASED_OUTPATIENT_CLINIC_OR_DEPARTMENT_OTHER): Payer: Medicare Other | Admitting: Oncology

## 2011-02-11 DIAGNOSIS — D649 Anemia, unspecified: Secondary | ICD-10-CM | POA: Insufficient documentation

## 2011-02-11 DIAGNOSIS — D4620 Refractory anemia with excess of blasts, unspecified: Secondary | ICD-10-CM

## 2011-02-11 DIAGNOSIS — D696 Thrombocytopenia, unspecified: Secondary | ICD-10-CM

## 2011-02-11 LAB — CBC WITH DIFFERENTIAL/PLATELET
BASO%: 0.5 % (ref 0.0–2.0)
EOS%: 0.5 % (ref 0.0–7.0)
LYMPH%: 70.5 % — ABNORMAL HIGH (ref 14.0–49.7)
MCHC: 35.4 g/dL (ref 31.5–36.0)
MCV: 98 fL (ref 79.5–101.0)
MONO%: 1 % (ref 0.0–14.0)
Platelets: 12 10*3/uL — ABNORMAL LOW (ref 145–400)
RBC: 2.31 10*6/uL — ABNORMAL LOW (ref 3.70–5.45)
RDW: 26.2 % — ABNORMAL HIGH (ref 11.2–14.5)
WBC: 1.5 10*3/uL — ABNORMAL LOW (ref 3.9–10.3)

## 2011-02-12 LAB — TYPE & CROSSMATCH - CHCC

## 2011-02-13 ENCOUNTER — Encounter (HOSPITAL_BASED_OUTPATIENT_CLINIC_OR_DEPARTMENT_OTHER): Payer: Medicare Other | Admitting: Oncology

## 2011-02-13 DIAGNOSIS — D696 Thrombocytopenia, unspecified: Secondary | ICD-10-CM

## 2011-02-13 DIAGNOSIS — D4620 Refractory anemia with excess of blasts, unspecified: Secondary | ICD-10-CM

## 2011-02-14 LAB — CROSSMATCH: ABO/RH(D): O NEG

## 2011-02-25 ENCOUNTER — Other Ambulatory Visit: Payer: Self-pay | Admitting: Medical

## 2011-02-25 ENCOUNTER — Other Ambulatory Visit: Payer: Self-pay | Admitting: Oncology

## 2011-02-25 ENCOUNTER — Encounter (HOSPITAL_BASED_OUTPATIENT_CLINIC_OR_DEPARTMENT_OTHER): Payer: Medicare Other | Admitting: Oncology

## 2011-02-25 DIAGNOSIS — D4620 Refractory anemia with excess of blasts, unspecified: Secondary | ICD-10-CM

## 2011-02-25 DIAGNOSIS — D696 Thrombocytopenia, unspecified: Secondary | ICD-10-CM

## 2011-02-25 LAB — CBC WITH DIFFERENTIAL/PLATELET
Basophils Absolute: 0 10*3/uL (ref 0.0–0.1)
Eosinophils Absolute: 0 10*3/uL (ref 0.0–0.5)
HCT: 23.7 % — ABNORMAL LOW (ref 34.8–46.6)
HGB: 7.8 g/dL — ABNORMAL LOW (ref 11.6–15.9)
MCV: 94.8 fL (ref 79.5–101.0)
NEUT#: 0.2 10*3/uL — CL (ref 1.5–6.5)
NEUT%: 15.7 % — ABNORMAL LOW (ref 38.4–76.8)
RDW: 20.9 % — ABNORMAL HIGH (ref 11.2–14.5)
lymph#: 1.1 10*3/uL (ref 0.9–3.3)

## 2011-02-25 LAB — URINALYSIS, MICROSCOPIC - CHCC
Nitrite: POSITIVE
Protein: 100 mg/dL
RBC count: NEGATIVE (ref 0–2)

## 2011-02-25 LAB — HOLD TUBE, BLOOD BANK

## 2011-02-26 LAB — BASIC METABOLIC PANEL
BUN: 67 — ABNORMAL HIGH
CO2: 24
Chloride: 107
Chloride: 110
Creatinine, Ser: 2.11 — ABNORMAL HIGH
GFR calc Af Amer: 27 — ABNORMAL LOW
Glucose, Bld: 143 — ABNORMAL HIGH
Potassium: 5.2 — ABNORMAL HIGH

## 2011-02-26 LAB — CROSSMATCH
ABO/RH(D): O NEG
Unit division: 0

## 2011-02-26 LAB — PROTIME-INR: INR: 1

## 2011-02-26 LAB — CBC
MCHC: 34.4
MCV: 104.8 — ABNORMAL HIGH
RBC: 3.52 — ABNORMAL LOW

## 2011-02-26 LAB — DIFFERENTIAL
Basophils Relative: 1
Eosinophils Absolute: 0.2
Monocytes Absolute: 0.2
Monocytes Relative: 5
Neutrophils Relative %: 48

## 2011-02-27 ENCOUNTER — Encounter (HOSPITAL_BASED_OUTPATIENT_CLINIC_OR_DEPARTMENT_OTHER): Payer: Medicare Other | Admitting: Oncology

## 2011-02-27 ENCOUNTER — Other Ambulatory Visit: Payer: Self-pay | Admitting: Oncology

## 2011-02-27 DIAGNOSIS — D61818 Other pancytopenia: Secondary | ICD-10-CM

## 2011-02-27 DIAGNOSIS — D4620 Refractory anemia with excess of blasts, unspecified: Secondary | ICD-10-CM

## 2011-02-27 DIAGNOSIS — Z85528 Personal history of other malignant neoplasm of kidney: Secondary | ICD-10-CM

## 2011-02-27 DIAGNOSIS — D696 Thrombocytopenia, unspecified: Secondary | ICD-10-CM

## 2011-02-27 LAB — CBC WITH DIFFERENTIAL/PLATELET
BASO%: 0.6 % (ref 0.0–2.0)
EOS%: 2.3 % (ref 0.0–7.0)
HCT: 28.4 % — ABNORMAL LOW (ref 34.8–46.6)
LYMPH%: 72.6 % — ABNORMAL HIGH (ref 14.0–49.7)
MCH: 31.9 pg (ref 25.1–34.0)
MCHC: 35.2 g/dL (ref 31.5–36.0)
MCV: 90.6 fL (ref 79.5–101.0)
NEUT%: 23.5 % — ABNORMAL LOW (ref 38.4–76.8)
Platelets: 8 10*3/uL — CL (ref 145–400)

## 2011-03-03 ENCOUNTER — Telehealth: Payer: Self-pay | Admitting: Internal Medicine

## 2011-03-03 NOTE — Telephone Encounter (Signed)
C/O lower left abdominal and nausea since back in the summer. She could not keep her appointment then because she had gout. She is taking Phenergan 1/2 tab and this helps the nausea. States her lower left side gets "sore" and just does not feel right. She states her bowel movements are regular. She is currently on Keflex 500 mg TID for UTI. Scheduled with Willette Cluster, NP on 03/04/11 at 2:00 PM.

## 2011-03-03 NOTE — Telephone Encounter (Signed)
I agree. DB 

## 2011-03-04 ENCOUNTER — Encounter: Payer: Self-pay | Admitting: Nurse Practitioner

## 2011-03-04 ENCOUNTER — Other Ambulatory Visit: Payer: Medicare Other

## 2011-03-04 ENCOUNTER — Ambulatory Visit (INDEPENDENT_AMBULATORY_CARE_PROVIDER_SITE_OTHER): Payer: Medicare Other | Admitting: Nurse Practitioner

## 2011-03-04 ENCOUNTER — Other Ambulatory Visit: Payer: Self-pay | Admitting: Nurse Practitioner

## 2011-03-04 VITALS — BP 130/58 | HR 60 | Ht 65.0 in | Wt 130.8 lb

## 2011-03-04 DIAGNOSIS — R1030 Lower abdominal pain, unspecified: Secondary | ICD-10-CM

## 2011-03-04 DIAGNOSIS — K219 Gastro-esophageal reflux disease without esophagitis: Secondary | ICD-10-CM

## 2011-03-04 DIAGNOSIS — R109 Unspecified abdominal pain: Secondary | ICD-10-CM

## 2011-03-04 DIAGNOSIS — R110 Nausea: Secondary | ICD-10-CM

## 2011-03-04 LAB — URINALYSIS, ROUTINE W REFLEX MICROSCOPIC
Bilirubin Urine: NEGATIVE
Ketones, ur: NEGATIVE
Leukocytes, UA: NEGATIVE
Urobilinogen, UA: 0.2 (ref 0.0–1.0)

## 2011-03-04 NOTE — Patient Instructions (Signed)
Please go to the basement level to have your labs drawn.   Take the Bentyl 10 mg 2-3 times daily as needed for cramping and spasms.

## 2011-03-04 NOTE — Progress Notes (Signed)
[name] [id number] 09-28-1925   HISTORY OR PRESENT ILLNESS : Patient is an 77 year old female with multiple medical problems including, but not limited to: myelodysplastic syndrome, renal cell carcinoma, hypertension, history of congestive heart failure , and history of stroke. She is known to Dr. Juanda Chance for history of lymphocytic gastritis, chronic constipation, diverticulosis, gastric ulcer, gastroparesis and esophageal stricturing. Her last colonoscopy  was in January 2008, small rectal polyps removed, they were non-adenomatous. She had a flexible sigmoidoscopy for diarrhea in June which was normal except for diverticulosis.  [name] had a an abnormal CT scan of the abdomen with prominent head of the pancreas in year 2000, findings felt to be a physiological variance. No pancreatic abnormalities on subsequent CTscans. Patient was last seen November 2011 after being treated with steroids for what was felt to be a reactivation of lymphocytic gastritis. Patient comes in today for evaluation of a several month history of intermittent LLQ pain associated with loose stool / incontinence. Most of the time bowel movements are formed and patient has no abdominal pain. Patient tells me she has had this pain before, Bentyl was helpful .  Patient complains of nausea, tells me her stomach doesn't empty. A gastric emptying study in May 2011 was in fact abnormal with 62% of activity remaining in stomach at 120 minutes. Recently patient has had recurrent UTIs treated with Keflex. No dysuria at present  Current Medications, Allergies, Past Medical History, Past Surgical History, Family History and Social History were reviewed in [employer] [facility] record.  PHYSICAL EXAMINATION : General: Well developed  female in no acute distress Head: Normocephalic and atraumatic Eyes:  sclerae anicteric,conjunctive pink. Ears: Normal auditory acuity Mouth: No deformity or lesions Neck: Supple, no masses.    Lungs: Clear throughout to auscultation Heart: Regular rate and rhythm; no murmurs heard Abdomen: Soft, nondistended,mild LLQ tenderness. No masses or hepatomegaly noted. Normal bowel sounds Rectal: No external lesion. No fecal impaction. Scant stool in vault. Musculoskeletal: Symmetrical with no gross deformities  Skin: No lesions on visible extremities Extremities: No edema or deformities noted Neurological: Alert oriented x 4, grossly nonfocal Cervical Nodes:  No significant cervical adenopathy Psychological:  Alert and cooperative. Normal mood and affect  ASSESSMENT AND PLAN :

## 2011-03-05 ENCOUNTER — Other Ambulatory Visit: Payer: Self-pay | Admitting: Nurse Practitioner

## 2011-03-06 ENCOUNTER — Telehealth: Payer: Self-pay | Admitting: *Deleted

## 2011-03-06 NOTE — Telephone Encounter (Signed)
Spoke with patient and gave her the results

## 2011-03-06 NOTE — Telephone Encounter (Signed)
Message copied by Daphine Deutscher on Fri Mar 06, 2011  8:26 AM ------      Message from: Meredith Pel      Created: Thu Mar 05, 2011  6:27 PM       Rene Kocher, please let patient know that her urine looks bette. She is being treated for UTI. Thanks

## 2011-03-08 DIAGNOSIS — R1030 Lower abdominal pain, unspecified: Secondary | ICD-10-CM | POA: Insufficient documentation

## 2011-03-08 NOTE — Assessment & Plan Note (Addendum)
Intermittent LLQ pain associated with loose stool / incontinence. Most of the time patient's bowel movements are normal and she doesn't have LLQ pain. Dicyclomine has helped in the past, will refill her prescription.  Check urinalysis to make sure recent UTI has resolved. Will call her with u/a results.

## 2011-03-09 ENCOUNTER — Encounter: Payer: Self-pay | Admitting: Nurse Practitioner

## 2011-03-09 ENCOUNTER — Other Ambulatory Visit: Payer: Self-pay | Admitting: *Deleted

## 2011-03-09 DIAGNOSIS — R110 Nausea: Secondary | ICD-10-CM | POA: Insufficient documentation

## 2011-03-09 MED ORDER — PROMETHAZINE HCL 12.5 MG PO TABS: ORAL_TABLET | ORAL | Status: DC

## 2011-03-09 NOTE — Telephone Encounter (Signed)
Patient asking for refill on Phenergan 12.5 mg.  Per Willette Cluster ACNP, we can refill the Phenergan 12.5 mg,  Take 1 every 4-6 hours as needed for nausea.   #60 , 1 refill. Pt wants this prescription to go to  [facility].

## 2011-03-09 NOTE — Progress Notes (Signed)
Reviewed and agree with management. Vear Staton T. Lilliauna Van MD FACG 

## 2011-03-09 NOTE — Assessment & Plan Note (Signed)
History of gastroparesis. Continue Phenergan as needed.

## 2011-03-09 NOTE — Assessment & Plan Note (Signed)
Asymptomatic on PPI

## 2011-03-10 ENCOUNTER — Inpatient Hospital Stay (HOSPITAL_COMMUNITY)
Admission: EM | Admit: 2011-03-10 | Discharge: 2011-03-16 | DRG: 372 | Disposition: A | Discharge status: Home Health | Payer: Medicare Other | Attending: Internal Medicine | Admitting: Internal Medicine

## 2011-03-10 ENCOUNTER — Emergency Department (HOSPITAL_COMMUNITY): Payer: Medicare Other

## 2011-03-10 DIAGNOSIS — A0472 Enterocolitis due to Clostridium difficile, not specified as recurrent: Principal | ICD-10-CM | POA: Diagnosis present

## 2011-03-10 DIAGNOSIS — K5730 Diverticulosis of large intestine without perforation or abscess without bleeding: Secondary | ICD-10-CM | POA: Diagnosis present

## 2011-03-10 DIAGNOSIS — F411 Generalized anxiety disorder: Secondary | ICD-10-CM | POA: Diagnosis present

## 2011-03-10 DIAGNOSIS — D469 Myelodysplastic syndrome, unspecified: Secondary | ICD-10-CM | POA: Diagnosis present

## 2011-03-10 DIAGNOSIS — I120 Hypertensive chronic kidney disease with stage 5 chronic kidney disease or end stage renal disease: Secondary | ICD-10-CM | POA: Diagnosis present

## 2011-03-10 DIAGNOSIS — R627 Adult failure to thrive: Secondary | ICD-10-CM | POA: Diagnosis present

## 2011-03-10 DIAGNOSIS — Z87440 Personal history of urinary (tract) infections: Secondary | ICD-10-CM

## 2011-03-10 DIAGNOSIS — N185 Chronic kidney disease, stage 5: Secondary | ICD-10-CM | POA: Diagnosis present

## 2011-03-10 DIAGNOSIS — E875 Hyperkalemia: Secondary | ICD-10-CM | POA: Diagnosis present

## 2011-03-10 DIAGNOSIS — N179 Acute kidney failure, unspecified: Secondary | ICD-10-CM | POA: Diagnosis present

## 2011-03-10 DIAGNOSIS — G4700 Insomnia, unspecified: Secondary | ICD-10-CM | POA: Diagnosis present

## 2011-03-10 DIAGNOSIS — R5381 Other malaise: Secondary | ICD-10-CM | POA: Diagnosis present

## 2011-03-10 LAB — PROTIME-INR
INR: 1.09 (ref 0.00–1.49)
Prothrombin Time: 14.3 seconds (ref 11.6–15.2)

## 2011-03-10 LAB — DIFFERENTIAL
Basophils Absolute: 0 10*3/uL (ref 0.0–0.1)
Basophils Relative: 0 % (ref 0–1)
Lymphocytes Relative: 64 % — ABNORMAL HIGH (ref 12–46)
Monocytes Relative: 2 % — ABNORMAL LOW (ref 3–12)
Neutro Abs: 0.6 10*3/uL — ABNORMAL LOW (ref 1.7–7.7)
Neutrophils Relative %: 33 % — ABNORMAL LOW (ref 43–77)

## 2011-03-10 LAB — URINALYSIS, ROUTINE W REFLEX MICROSCOPIC
Nitrite: NEGATIVE
Protein, ur: 100 mg/dL — AB
Specific Gravity, Urine: 1.022 (ref 1.005–1.030)
Urobilinogen, UA: 0.2 mg/dL (ref 0.0–1.0)

## 2011-03-10 LAB — COMPREHENSIVE METABOLIC PANEL
AST: 13 U/L (ref 0–37)
Albumin: 3.1 g/dL — ABNORMAL LOW (ref 3.5–5.2)
BUN: 58 mg/dL — ABNORMAL HIGH (ref 6–23)
Calcium: 10 mg/dL (ref 8.4–10.5)
Chloride: 106 mEq/L (ref 96–112)
Creatinine, Ser: 2.37 mg/dL — ABNORMAL HIGH (ref 0.50–1.10)
Total Protein: 6.8 g/dL (ref 6.0–8.3)

## 2011-03-10 LAB — CBC
MCV: 91.2 fL (ref 78.0–100.0)
Platelets: 15 10*3/uL — CL (ref 150–400)
RBC: 2.72 MIL/uL — ABNORMAL LOW (ref 3.87–5.11)
WBC: 1.8 10*3/uL — ABNORMAL LOW (ref 4.0–10.5)

## 2011-03-10 LAB — CK TOTAL AND CKMB (NOT AT ARMC)
CK, MB: 1.2 ng/mL (ref 0.3–4.0)
Relative Index: INVALID (ref 0.0–2.5)
Total CK: 25 U/L (ref 7–177)

## 2011-03-10 LAB — APTT: aPTT: 30 seconds (ref 24–37)

## 2011-03-10 LAB — TROPONIN I: Troponin I: <0.3 ng/mL (ref <0.30)

## 2011-03-10 LAB — URINE MICROSCOPIC-ADD ON

## 2011-03-11 ENCOUNTER — Inpatient Hospital Stay (HOSPITAL_COMMUNITY): Payer: Medicare Other

## 2011-03-11 DIAGNOSIS — D61818 Other pancytopenia: Secondary | ICD-10-CM

## 2011-03-11 DIAGNOSIS — D469 Myelodysplastic syndrome, unspecified: Secondary | ICD-10-CM

## 2011-03-11 DIAGNOSIS — R112 Nausea with vomiting, unspecified: Secondary | ICD-10-CM

## 2011-03-11 DIAGNOSIS — E860 Dehydration: Secondary | ICD-10-CM

## 2011-03-11 LAB — URINE CULTURE: Colony Count: >=100000

## 2011-03-11 LAB — APTT: aPTT: 25

## 2011-03-11 LAB — CROSSMATCH

## 2011-03-11 LAB — BASIC METABOLIC PANEL
BUN: 62 mg/dL — ABNORMAL HIGH (ref 6–23)
BUN: 36 — ABNORMAL HIGH
BUN: 40 — ABNORMAL HIGH
BUN: 42 — ABNORMAL HIGH
BUN: 44 — ABNORMAL HIGH
CO2: 23 mEq/L (ref 19–32)
CO2: 24
CO2: 24
CO2: 26
Calcium: 8.4
Chloride: 109 mEq/L (ref 96–112)
Chloride: 102
Chloride: 103
Chloride: 107
Creatinine, Ser: 2.33 mg/dL — ABNORMAL HIGH (ref 0.50–1.10)
Creatinine, Ser: 1.88 — ABNORMAL HIGH
Creatinine, Ser: 2.31 — ABNORMAL HIGH
Creatinine, Ser: 2.45 — ABNORMAL HIGH
GFR calc Af Amer: 21 mL/min — ABNORMAL LOW (ref >90)
GFR calc Af Amer: 25 — ABNORMAL LOW
GFR calc non Af Amer: 19 — ABNORMAL LOW
Glucose, Bld: 106 mg/dL — ABNORMAL HIGH (ref 70–99)
Glucose, Bld: 109 — ABNORMAL HIGH
Glucose, Bld: 111 — ABNORMAL HIGH
Glucose, Bld: 155 — ABNORMAL HIGH
Potassium: 4.6
Potassium: 5.1
Sodium: 133 — ABNORMAL LOW

## 2011-03-11 LAB — URINE MICROSCOPIC-ADD ON

## 2011-03-11 LAB — DIFFERENTIAL
Basophils Absolute: 0
Basophils Relative: 1
Eosinophils Absolute: 0.2
Eosinophils Relative: 3
Lymphocytes Relative: 28
Lymphs Abs: 1.6
Monocytes Absolute: 0.3
Monocytes Relative: 5
Neutro Abs: 3.7
Neutrophils Relative %: 64

## 2011-03-11 LAB — CBC
HCT: 21.3 — ABNORMAL LOW
HCT: 26.3 — ABNORMAL LOW
HCT: 31.9 — ABNORMAL LOW
Hemoglobin: 10.9 — ABNORMAL LOW
Hemoglobin: 7.3 — CL
MCHC: 34
MCHC: 34.1
MCHC: 34.2
MCHC: 34.7
MCV: 91.6
MCV: 93.4
MCV: 97.1
MCV: 97.4
Platelets: 104 — ABNORMAL LOW
Platelets: 112 — ABNORMAL LOW
Platelets: 148 — ABNORMAL LOW
Platelets: 90 — ABNORMAL LOW
RBC: 2.87 — ABNORMAL LOW
RBC: 3.29 — ABNORMAL LOW
RDW: 13.4
RDW: 13.6
WBC: 5.4
WBC: 5.6
WBC: 5.8

## 2011-03-11 LAB — URINALYSIS, ROUTINE W REFLEX MICROSCOPIC
Bilirubin Urine: NEGATIVE
Glucose, UA: NEGATIVE
Ketones, ur: NEGATIVE
Leukocytes, UA: NEGATIVE
Nitrite: POSITIVE — AB
Protein, ur: NEGATIVE
Specific Gravity, Urine: 1.012
Urobilinogen, UA: 0.2
pH: 5.5

## 2011-03-11 LAB — BASIC METABOLIC PANEL WITH GFR
Calcium: 9.9
GFR calc Af Amer: 31 — ABNORMAL LOW
GFR calc non Af Amer: 26 — ABNORMAL LOW
Potassium: 4.4
Sodium: 140

## 2011-03-11 LAB — PROTIME-INR
INR: 1
Prothrombin Time: 13.5

## 2011-03-11 LAB — ABO/RH: ABO/RH(D): O NEG

## 2011-03-12 LAB — CBC
HCT: 19.7 % — ABNORMAL LOW (ref 36.0–46.0)
HCT: 20.2 % — ABNORMAL LOW (ref 36.0–46.0)
Hemoglobin: 6.7 g/dL — CL (ref 12.0–15.0)
MCV: 89.4 fL (ref 78.0–100.0)
Platelets: 13 10*3/uL — CL (ref 150–400)
RBC: 2.17 MIL/uL — ABNORMAL LOW (ref 3.87–5.11)
RBC: 2.26 MIL/uL — ABNORMAL LOW (ref 3.87–5.11)
RDW: 19.1 % — ABNORMAL HIGH (ref 11.5–15.5)
WBC: 1.3 10*3/uL — CL (ref 4.0–10.5)
WBC: 1.3 10*3/uL — CL (ref 4.0–10.5)

## 2011-03-12 LAB — BASIC METABOLIC PANEL
CO2: 16 mEq/L — ABNORMAL LOW (ref 19–32)
Chloride: 110 mEq/L (ref 96–112)
GFR calc Af Amer: 27 mL/min — ABNORMAL LOW (ref >90)
Potassium: 5.8 mEq/L — ABNORMAL HIGH (ref 3.5–5.1)

## 2011-03-13 LAB — BASIC METABOLIC PANEL
CO2: 21 mEq/L (ref 19–32)
Calcium: 9.9 mg/dL (ref 8.4–10.5)
Creatinine, Ser: 1.87 mg/dL — ABNORMAL HIGH (ref 0.50–1.10)
Glucose, Bld: 107 mg/dL — ABNORMAL HIGH (ref 70–99)

## 2011-03-13 LAB — CBC
Hemoglobin: 9.6 g/dL — ABNORMAL LOW (ref 12.0–15.0)
MCH: 31.1 pg (ref 26.0–34.0)
MCV: 89 fL (ref 78.0–100.0)
RBC: 3.09 MIL/uL — ABNORMAL LOW (ref 3.87–5.11)

## 2011-03-14 LAB — BASIC METABOLIC PANEL
BUN: 43 mg/dL — ABNORMAL HIGH (ref 6–23)
Chloride: 106 mEq/L (ref 96–112)
GFR calc Af Amer: 24 mL/min — ABNORMAL LOW (ref >90)
Potassium: 5.4 mEq/L — ABNORMAL HIGH (ref 3.5–5.1)
Sodium: 136 mEq/L (ref 135–145)

## 2011-03-14 LAB — CBC
HCT: 25.8 % — ABNORMAL LOW (ref 36.0–46.0)
Hemoglobin: 8.9 g/dL — ABNORMAL LOW (ref 12.0–15.0)
RDW: 18.2 % — ABNORMAL HIGH (ref 11.5–15.5)
WBC: 1.1 10*3/uL — CL (ref 4.0–10.5)

## 2011-03-14 LAB — PREPARE PLATELET PHERESIS

## 2011-03-15 DIAGNOSIS — D469 Myelodysplastic syndrome, unspecified: Secondary | ICD-10-CM

## 2011-03-15 LAB — BASIC METABOLIC PANEL
CO2: 25 mEq/L (ref 19–32)
Chloride: 106 mEq/L (ref 96–112)
Glucose, Bld: 102 mg/dL — ABNORMAL HIGH (ref 70–99)
Potassium: 5.1 mEq/L (ref 3.5–5.1)
Sodium: 138 mEq/L (ref 135–145)

## 2011-03-15 LAB — CBC
Hemoglobin: 8.7 g/dL — ABNORMAL LOW (ref 12.0–15.0)
RBC: 2.86 MIL/uL — ABNORMAL LOW (ref 3.87–5.11)

## 2011-03-15 LAB — PREPARE PLATELET PHERESIS

## 2011-03-16 DIAGNOSIS — D469 Myelodysplastic syndrome, unspecified: Secondary | ICD-10-CM

## 2011-03-16 DIAGNOSIS — D61818 Other pancytopenia: Secondary | ICD-10-CM

## 2011-03-16 LAB — CROSSMATCH
ABO/RH(D): O NEG
Antibody Screen: NEGATIVE
Unit division: 0

## 2011-03-16 LAB — BASIC METABOLIC PANEL
CO2: 23 mEq/L (ref 19–32)
Chloride: 105 mEq/L (ref 96–112)
GFR calc non Af Amer: 21 mL/min — ABNORMAL LOW (ref >90)
Glucose, Bld: 103 mg/dL — ABNORMAL HIGH (ref 70–99)
Potassium: 5.7 mEq/L — ABNORMAL HIGH (ref 3.5–5.1)
Sodium: 136 mEq/L (ref 135–145)

## 2011-03-16 LAB — CBC
Hemoglobin: 8.8 g/dL — ABNORMAL LOW (ref 12.0–15.0)
MCH: 30.6 pg (ref 26.0–34.0)
MCV: 89.9 fL (ref 78.0–100.0)
RBC: 2.88 MIL/uL — ABNORMAL LOW (ref 3.87–5.11)

## 2011-03-18 ENCOUNTER — Telehealth: Payer: Self-pay | Admitting: Internal Medicine

## 2011-03-18 NOTE — Telephone Encounter (Signed)
Pt is schedule with Dr. [name] on 10/23 for a hospital follow up pt does not want to come in that early. Pt said she has seen Dr. Caryl Never in the past and would like to see him for her hospital follow up. Would it be ok to reschedule her with Dr. Caryl Never? Arline Asp said ok

## 2011-03-18 NOTE — Telephone Encounter (Signed)
She should be encouraged to follow up with primary as scheduled.

## 2011-03-18 NOTE — Telephone Encounter (Signed)
Please advise 

## 2011-03-24 ENCOUNTER — Ambulatory Visit (INDEPENDENT_AMBULATORY_CARE_PROVIDER_SITE_OTHER): Payer: Medicare Other | Admitting: Internal Medicine

## 2011-03-24 ENCOUNTER — Encounter: Payer: Self-pay | Admitting: Internal Medicine

## 2011-03-24 DIAGNOSIS — N390 Urinary tract infection, site not specified: Secondary | ICD-10-CM

## 2011-03-24 DIAGNOSIS — D469 Myelodysplastic syndrome, unspecified: Secondary | ICD-10-CM

## 2011-03-24 DIAGNOSIS — I10 Essential (primary) hypertension: Secondary | ICD-10-CM

## 2011-03-24 DIAGNOSIS — Z8719 Personal history of other diseases of the digestive system: Secondary | ICD-10-CM

## 2011-03-24 LAB — BASIC METABOLIC PANEL
CO2: 23 mEq/L (ref 19–32)
Calcium: 9.4 mg/dL (ref 8.4–10.5)
Glucose, Bld: 109 mg/dL — ABNORMAL HIGH (ref 70–99)
Sodium: 141 mEq/L (ref 135–145)

## 2011-03-24 LAB — CBC WITH DIFFERENTIAL/PLATELET
Basophils Absolute: 0 10*3/uL (ref 0.0–0.1)
Eosinophils Absolute: 0 10*3/uL (ref 0.0–0.7)
Hemoglobin: 8.1 g/dL — ABNORMAL LOW (ref 12.0–15.0)
Lymphocytes Relative: 71 % — ABNORMAL HIGH (ref 12.0–46.0)
Lymphs Abs: 1.2 10*3/uL (ref 0.7–4.0)
MCHC: 34.8 g/dL (ref 30.0–36.0)
Monocytes Absolute: 0 10*3/uL — ABNORMAL LOW (ref 0.1–1.0)
Neutro Abs: 0.4 10*3/uL — ABNORMAL LOW (ref 1.4–7.7)
RDW: 21.6 % — ABNORMAL HIGH (ref 11.5–14.6)

## 2011-03-24 NOTE — Progress Notes (Signed)
  Subjective:    Patient ID: [name], female    DOB: December 19, 1925, 77 y.o.   MRN: [id number]  HPI  accompanied by a friend of first period C. Diff colitis-- treated in hospital---see notes in Echart. She is feeling much better. No diarrhea.  HTn--tolerating med  MDS--note pancytopenia, note transfusion in hospital.  Past Medical History  Diagnosis Date  . Acquired pancytopenia   . Hypertension   . Depression   . Osteoporosis   . HLD (hyperlipidemia)   . Fracture, hip     bilateral  . CVA (cerebral infarction)   . Chronic kidney disease   . Renal cell carcinoma   . Stricture esophagus   . Glaucoma   . CHF (congestive heart failure)   . Systolic dysfunction   . Raynaud disease   . Hyperhomocysteinemia   . Lymphocytic gastritis   . Anxiety   . DJD (degenerative joint disease)   . Gastric ulcer   . Myelodysplastic syndrome    Past Surgical History  Procedure Date  . Abdominal hysterectomy   . Cataract extraction   . Varicose vein surgery   . Tubal ligation   . Hip fracture surgery      bilateral  . Spine surgery   . Kidney surgery     for renal carcinoma    reports that she has never smoked. She has never used smokeless tobacco. She reports that she does not drink alcohol or use illicit drugs. family history includes Breast cancer in her sister and Diabetes in her brother.  There is no history of Colon cancer. Allergies  Allergen Reactions  . Clonidine Hydrochloride     REACTION: Reaction per patient, can't take.  Also references hive on forehead \\T \ spts on chest from other meds  . Doxazosin Mesylate     REACTION: holding due to itching  . Doxycycline Hyclate     REACTION: rash  . Erythromycin Ethylsuccinate     REACTION: unspecified  . Meperidine Hcl     REACTION: unspecified  . Penicillins     REACTION: unspecified  . Sulfonamide Derivatives     REACTION: unspecified     Review of Systems    patient denies chest pain, shortness of breath,  orthopnea. Denies lower extremity edema, abdominal pain, change in appetite, change in bowel movements. Patient denies rashes, musculoskeletal complaints. No other specific complaints in a complete review of systems.    Objective:   Physical Exam  Elderly female in no acute distress. She appears pale. Neck is supple. Chest is clear to auscultation abdominal exam active bowel sounds, soft, nontender. Extremities no edema. Dermatological exam there is no rash.      Assessment & Plan:

## 2011-03-26 ENCOUNTER — Other Ambulatory Visit: Payer: Self-pay | Admitting: Internal Medicine

## 2011-03-26 MED ORDER — ALPRAZOLAM 0.5 MG PO TABS: 0.5000 mg | ORAL_TABLET | Freq: Every evening | ORAL | Status: DC | PRN

## 2011-03-26 MED ORDER — METRONIDAZOLE 500 MG PO TABS: 500.0000 mg | ORAL_TABLET | Freq: Three times a day (TID) | ORAL | Status: DC

## 2011-03-26 MED ORDER — HYDROCODONE-ACETAMINOPHEN 5-500 MG PO TABS: ORAL_TABLET | ORAL | Status: DC

## 2011-03-26 NOTE — Telephone Encounter (Signed)
rxs faxed in.

## 2011-03-26 NOTE — Telephone Encounter (Signed)
Patient req refill ALPRAZolam (XANAX) 0.5 MG tablet and HYDROcodone-acetaminophen (VICODIN) 5-500 MG per tablet and metroNIDAZOLE (FLAGYL) 500 MG tablet to [facility] order pharmacy [phone]

## 2011-03-27 ENCOUNTER — Other Ambulatory Visit: Payer: Self-pay | Admitting: Oncology

## 2011-03-27 ENCOUNTER — Encounter (HOSPITAL_BASED_OUTPATIENT_CLINIC_OR_DEPARTMENT_OTHER): Payer: Medicare Other | Admitting: Oncology

## 2011-03-27 ENCOUNTER — Other Ambulatory Visit: Payer: Self-pay | Admitting: *Deleted

## 2011-03-27 ENCOUNTER — Encounter (HOSPITAL_COMMUNITY)
Admission: RE | Admit: 2011-03-27 | Discharge: 2011-03-27 | Disposition: A | Discharge status: Home / Self Care | Payer: Medicare Other | Source: Ambulatory Visit | Attending: Medical | Admitting: Medical

## 2011-03-27 DIAGNOSIS — D61818 Other pancytopenia: Secondary | ICD-10-CM

## 2011-03-27 DIAGNOSIS — D696 Thrombocytopenia, unspecified: Secondary | ICD-10-CM

## 2011-03-27 DIAGNOSIS — D649 Anemia, unspecified: Secondary | ICD-10-CM | POA: Insufficient documentation

## 2011-03-27 DIAGNOSIS — D4620 Refractory anemia with excess of blasts, unspecified: Secondary | ICD-10-CM

## 2011-03-27 LAB — CBC WITH DIFFERENTIAL/PLATELET
BASO%: 0.8 % (ref 0.0–2.0)
EOS%: 1.7 % (ref 0.0–7.0)
HCT: 20.3 % — ABNORMAL LOW (ref 34.8–46.6)
LYMPH%: 74.4 % — ABNORMAL HIGH (ref 14.0–49.7)
MCH: 31.7 pg (ref 25.1–34.0)
MCHC: 34.5 g/dL (ref 31.5–36.0)
MCV: 91.9 fL (ref 79.5–101.0)
NEUT%: 21.6 % — ABNORMAL LOW (ref 38.4–76.8)
Platelets: 10 10*3/uL — ABNORMAL LOW (ref 145–400)

## 2011-03-27 LAB — URINE CULTURE

## 2011-03-27 MED ORDER — NITROFURANTOIN MACROCRYSTAL 100 MG PO CAPS: 100.0000 mg | ORAL_CAPSULE | Freq: Four times a day (QID) | ORAL | Status: AC

## 2011-03-27 NOTE — Assessment & Plan Note (Signed)
Patient with recent C. difficile colitis. I don't think any further evaluation necessary as she has dramatically improved.

## 2011-03-27 NOTE — Assessment & Plan Note (Signed)
I'll check laboratory work today.

## 2011-03-28 LAB — CROSSMATCH

## 2011-03-31 NOTE — H&P (Signed)
NAMEDORATHA, MCSWAIN NO.:  [id number]  MEDICAL RECORD NO.:  [id number]  LOCATION:  1515                         FACILITY:  [facility]  PHYSICIAN:  Candelaria Celeste, DO      DATE OF BIRTH:  1926-02-27  DATE OF ADMISSION:  03/10/2011 DATE OF DISCHARGE:                             HISTORY & PHYSICAL   PRIMARY CARE PROVIDER:  [name]. Swords, MD  CHIEF COMPLAINT:  Nausea, abdominal pain.  HISTORY OF PRESENT ILLNESS:  Ms. Stenner is an 77 year old female with history of myelodysplastic syndrome, hypertension with chronic urinary tract infection, and chronic renal disease, who presents to the hospital with a 3-day history of worsening left lower abdominal pain, which she describes as cramping and rates at 5 to 6/10.  The patient also states that she has been nauseated and has a lack of appetite though has continued to eat foods.  She does report retching without vomiting today.  Patient continues to have stools that she describes as watery diarrhea with multiple episodes during the day.  She has not had a formed stool for the past several days and has been on Keflex for the past 2 weeks for chronic Citrobacter urinary tract infection.  She does not have fevers, but admits to some mild chills.  PAST MEDICAL HISTORY: 1. Myelodysplastic syndrome. 2. Chronic renal insufficiency. 3. Chronic hypertension. 4. Citrobacter UTI.  PAST SURGICAL HISTORY: 1. Hysterectomy. 2. Spinal surgery. 3. Bone marrow biopsy. 4. Total right hip replacement for displaced right femoral neck     fracture. 5. Intramedullary nail fixation on the left intertrochanteric hip     fracture.  MEDICATIONS: 1. Xanax 0.5 mg q.h.s. 2. Keflex 500 mg t.i.d. 3. Plendil 24 hour tablets 5 mg p.o. daily. 4. Hydralazine 25 mg p.o. t.i.d. 5. Lisinopril 40 mg p.o. daily. 6. Prilosec 20 mg p.o. daily. 7. MiraLax 17 g daily. 8. Medrol Dosepak 4 mg tablets, please follow instructions - the     patient is  uncertain whether she is still taking this. 9. Kayexalate. 10.Folic acid. 11.Bentyl p.r.n.  ALLERGIES:  The patient reports allergies to PENICILLIN and SULFA, which develops a rash.  FAMILY HISTORY:  Noncontributory.  SOCIAL HISTORY:  The patient currently lives alone.  Denies tobacco or alcohol use.  She is widowed for past several years.  REVIEW OF SYSTEMS:  The patient does reports a cough with mild shortness of breath occasionally, which is chronic.  She also reports having diarrhea and intermittent dysuria when off her antibiotics.  She also reports joint pain.  All other systems are as stated in the HPI or otherwise, negative.  PHYSICAL EXAMINATION:  VITAL SIGNS:  Temperature is 99.3 heart rate is 65, blood pressure 148/46, respiratory rate 18. GENERAL:  This is an elderly Caucasian female, who is awake, alert, and oriented x3, in no acute distress. HEENT.  Head is normocephalic, atraumatic.  Pupils are equal, round, reactive to light.  Extraocular muscles are intact.  Sclerae are anicteric and noninjected.  Tympanic membranes are translucent with good cone of light.  Oropharynx shows nonerythematous, nonenlarged tonsils with no mucosal lesions.  The mucous membranes are dry. LUNGS:  Clear to  auscultation bilaterally with no wheezes, rales, or rhonchi. CARDIAC:  Regular rate with a 2/6 systolic ejection murmur heard best in the right upper sternal border. ABDOMEN:  Soft, tender in the left lower quadrant with guarding and mild rebound.  There is no obturator or psoas signs.  Bowel sounds are present.  There is no distention noted. VASCULAR:  2+ dorsalis pedis radial pulses with brisk capillary refill. EXTREMITIES:  Warm to touch.  There is no edema present. NEURO:  Strength is 5/5 in upper lower extremities bilaterally with no focal deficits observed.  Sensation is intact to light touch. MUSCULOSKELETAL:  All major joints are nonerythematous and nonenlarged and  nontender.  LABORATORY DATA: 1. A CMP was performed, which showed a sodium of 137, potassium of     5.4, chloride of 106, bicarb of 22, glucose of 131, BUN of 58,     creatinine of 2.37.  Bilirubin of 0.4.  Alk phos of 138, AST of 13,     and ALT of 5. 2. A CBC was drawn with a differential, which showed a white count of     1.8 with hemoglobin of 8.5, hematocrit of 24.8, and platelets of     15, percent neutrophils are 33 with an ANC of 0.6. 3. Cardiac enzyme showed a creatine kinase of 25 and CK-MB of 1.2.     Troponin I of less than 0.3, and INR of 1.09. 4. Chest two-view shows chronic lung disease with prominent     interstitial markings.  ASSESSMENT AND PLAN: 1. Left abdominal pain with nausea and diarrhea. The etiology of the     patient's left lower quadrant abdominal pain is uncertain.  The     patient does have a history of diverticulosis, therefore,     diverticulitis would the suspect and certainly could be     contributing to the patient's other symptoms.  For the patient's     resurgence of her urinary tract infection, that is with a bacteria     that is not currently being covered by Keflex.  Further with the     patient's current antibiotic use Clostridium difficile colitis     would also be a concern.  For the patient the etiology of the     patient's symptoms, we will obtain a CT of the abdomen with     contrast to rule out the diverticulitis.  We will also obtain an     urinary urinalysis and send it for culture if indicated.     Furthermore, we will obtain stool studies such as a fecal smear as     well as for Clostridium difficile toxin to rule this out.  We will     not begin to treat the patient empirically until the etiology of     the patient's abdominal pain is found. 2. Failure to thrive.  Certainly, this could be contributed to the     patient's above complaints.  We will encourage her regular diet and     continue with IV fluids at 75 mL/hour. 3.  Dehydration.  We will continue with IV fluids as mentioned above. 4. Hypertension.  We will continue with home meds. 5. Myelodysplastic syndrome.  Heme/Onc is to follow and we place the     patient on neutropenic precautions. 6. Pancytopenia.  We will attempt to limit blood draws. 7. Deep vein thrombosis prophylaxis.  Due to the patient's     thrombocytopenia, we will place the patient on  sequential     compression devices and hold on anticoagulation. 8. Code status.  The patient indicated a DNR/DNI.          ______________________________ Candelaria Celeste, DO     JS/MEDQ  D:  03/10/2011  T:  03/11/2011  Job:  [id number]  Electronically Signed by Candelaria Celeste DO on 03/31/2011 03:11:42 PM

## 2011-04-03 NOTE — Discharge Summary (Signed)
[name], [name] NO.:  [id number]  MEDICAL RECORD NO.:  [id number]  LOCATION:  1515                         FACILITY:  [facility]  PHYSICIAN:  Rosanna Randy, MDDATE OF BIRTH:  09-13-1925  DATE OF ADMISSION:  03/10/2011 DATE OF DISCHARGE:  03/16/2011                              DISCHARGE SUMMARY   PRIMARY CARE PHYSICIAN:  [name]. Swords, MD  DISCHARGE DIAGNOSES: 1. Clostridium difficile colitis. 2. Pancytopenia secondary to myelodysplastic syndrome. 3. Hypertension. 4. Acute on chronic kidney disease. 5. Hyperkalemia. 6. Decreased appetite due to the Clostridium difficile infection. 7. History of recurrent urinary tract infection. 8. History of renal cell carcinoma, status post radiofrequency     ablation. 9. History of hysterectomy. 10.History of anxiety. 11.History of gastroesophageal reflux disease. 12.History of total right hip replacement for displaced right femoral     neck fracture. 13.Intramedullary nail fixation on the left intertrochanteric hip     fracture. 14.Insomnia. 15.Diverticulosis.  DISCHARGE MEDICATIONS:  Includes: 1. Ensure 237 mL by mouth three times a day. 2. Famotidine 20 mg one tablet by mouth daily. 3. Felodipine 10 mg one tablet by mouth daily. 4. Hydralazine 50 mg by mouth three times a day. 5. Flagyl 500 mg one tablet by mouth every 8 hours for the next 7     days. 6. Bisoprolol 10 mg one tablet by mouth daily. 7. Foley acid 1 mg one tablet by mouth daily. 8. Vicodin half to one tablet by mouth every 12 hours as needed for     pain. 9. MiraLax 17 g by mouth daily as needed for constipation. 10.Refresh carboxymethylcellulose eyedrop 1 drop in both eyes twice a     day. 11.Tylenol PM one tablet by mouth daily at bedtime. 12.Alprazolam 0.5 mg one tablet by mouth daily at bedtime as needed     for insomnia. Patient was instructed to stop using omeprazole and also lisinopril due to the patient's acute Clostridium  difficile infection and hyperkalemia. She is going to follow with primary care physician for further adjustment and decisions regarding restarting these drugs.  DISPOSITION AND FOLLOWUP:  Patient had been discharged in stable improved condition.  Currently, not complaining of any abdominal pain. No nausea.  No vomiting.  Tolerating POs with better appetite (even this has still decreased).  She is going to arrange a followup appointment with primary care physician over the next 7 to 10 days in order to go over her symptoms and also to repeat a CBC and basic metabolic panel. Important to be looking patient's platelets and hemoglobin, and also electrolytes and renal function since her potassium was elevated and her creatinine was a little bit worse in comparison to her baseline. Arrangement has been made in order to have [place].  Following the patient again for home health PT, home health OT, and also for bath aid. The patient was instructed to follow heart-healthy diet and to take her medications as prescribed, especially she was encouraged to keep herself hydrated.  PROCEDURE PERFORMED: 1. During this hospitalization, patient had a CT of the abdomen and     pelvis on March 11, 2011, that demonstrated focal inflammatory or  infection process involving the mid, small bowel in the left mid to     lower abdomen and upper pelvis.  This could be due to jejunal     diverticulitis, other inflammatory, or infectious enteritis.  There     was no obvious perforation or foreign body. 2. Diverticulosis and muscular thickening involving the sigmoid colon,     but no findings or acute diverticulitis. 3. Mild diffuse persistent rectal wall thickening. 4. Enlarging right hepatic lobe liver lesion. 5. Small amount of free abdominal and pelvic fluid.  She also had a     chest x-ray on the date of admission that demonstrated chronic lung     diseases with prominent interstitial markings, scarring,  or mild     linear atelectasis in the right midlung with atherosclerosis of the     aorta.  No other procedures were performed during this     hospitalization.  Hematology-Oncology was consulted during this     admission.  HISTORY OF PRESENT ILLNESS:  For full details, please refer to dictation done by Dr. Adrian Blackwater, on March 10, 2011, but briefly, this is an 77 year old female with a past medical history of myelodysplastic syndrome, hypertension, chronic urinary tract infections, and also chronic renal disease, who presents to the hospital with a 3-day history of worsening left lower abdominal pain, which she described as cramping and rated 5 to 6/10 in intensity.  Patient also states that she has been nauseated and has a lack of appetite.  The patient reports 1 or 2 episodes of vomiting throughout this syndromic period of time.  Patient is also having diarrhea and described her stools are watery, multiple episodes during the day, no blood in it.  Patient endorses that she has been on Keflex for the past 2 weeks for chronic Citrobacter urinary tract infection.  She denies any fevers, but admits on mild chills.  PERTINENT LABORATORY DATA:  Throughout this hospitalization includes, troponin less than 0.30.  A comprehensive metabolic panel with a sodium of 137, potassium 5.7, chloride 106, bicarb 22, glucose 131, BUN 58, creatinine 2.37.  LFTs showing an alkaline phosphatase of 138 and albumin blood 3.1.  PT 14.3, INR 1.09, PTT 30.  CK-MB and CK total 1.2 and 25 respectively.  CBC:  White blood cells 1.8, hemoglobin 8.5, platelets 15,000.  Urinalysis negative, completely normal microscopy. Clostridium difficile by PCR positive.  The patient has subsequent CBCs and BMET throughout this hospitalization in order to follow on her pancytopenia and renal function plus hyperkalemia.  At discharge, most recent values demonstrated a sodium of 136, potassium 5.3, chloride of 105, bicarb of 23,  glucose 103, BUN 40, creatinine 2.0, calcium 9.6.  A CBC with differential showed a white blood cells of 1.2, hemoglobin 8.8, platelets 9000.  Of note, patient received 1 unit of PRBCs during this hospitalization and she received 2 units of platelets throughout this hospitalization.  HOSPITAL COURSE BY PROBLEM: 1. Pancytopenia secondary to MDS that was actually treated by     Hematology-Oncology, who has recommended at this point, no further     platelet transfusion and to discharge the patient home with follow     up at the primary care physician's office and also follow up with     Dr. Clelia Croft as an outpatient.  Ms. Saperstein is currently no bleeding     and her hematologic counts has been stable for the last 48 hours. 2. C. diff infection causing abdominal pain, nausea, vomiting, and  decrease on her appetite.  At this point, she is not having any     further pain in her belly.  There is no nausea and no vomiting.     Her appetite has improved and she is able to tolerate POs.  She is     going to continue using Flagyl and due to her immunocompromised     state due to the myelodysplastic syndrome, we are going to treat     for a total of 14 days.  She will continue for 10 more days on     Flagyl as an outpatient, and will follow with primary care     physician for further evaluation of her symptoms. 3. Chronic kidney disease, which is a stage 4 to 5.  When she came     into the hospital, there was acute exacerbation of her chronic     kidney disease, but on discharge, her creatinine has improved and     is pretty much back to baseline.  At this point, we have stopped     the lisinopril especially because it was also noticed that Ms.     Achee was having hyperkalemia and with the acute worsening of her     renal function.  She will follow with primary care physician for     further evaluation and treatment on her chronic conditions. 4. Hypertension.  After discontinuing her lisinopril,  patient's blood     pressure was elevated and the rest of her medications were adjusted     accordingly in order to achieve a better blood pressure control.     At discharge, her blood pressure systolic is in the 140s range and     patient had been advised to follow a low-sodium, heart-healthy     diet.  She will follow with primary care physician for further     adjustment in her antihypertensive drugs. 5. Gastroesophageal reflux disease.  She was normally using a PPI.     Currently due to the active C. diff infection, following extended     ID's protocol recommendations, we are going to stop PPIs and we     will going to use famotidine.  This is a medication that could     potentially be restarted (omeprazole once the complete treatment     for her C. diff has concluded). 6. Hyperkalemia secondary to her chronic kidney disease.  Patient     received Kayexalate during this hospitalization x1 and her     potassium has been in the stable levels for high normal.  At     discharge, it was 5.3. 7. Physical deconditioning.  Physical therapy and occupational therapy     has been arranged to provide treatment at home.  Patient has     refused to go to a nursing facility. 8. Dehydration.  She had received gentle fluid resuscitation, is no     longer orthostatic or having any issues with her vital signs. 9. Anxiety.  We are going to continue using p.r.n. alprazolam. 10.Decreased appetite, failure to thrive.  We are going to use Ensure     3 times a day, at least to guarantee caloric intake.  The rest of     her medical problems remained stable and no changes have been done     to her medication regimen.  PHYSICAL EXAMINATION:  VITAL SIGNS:  At discharge, temperature 97.7, heart rate 86, respiratory rate 20, blood pressure 143/55, oxygen saturation 100%  on room air. GENERAL:  The patient was in no acute distress. RESPIRATORY SYSTEM:  Clear to auscultation bilaterally.  No wheezing. No  rhonchi. HEART:  Regular rate and rhythm. ABDOMEN:  Soft and nontender with positive bowel sounds. EXTREMITIES:  Without any edema. NEUROLOGIC EXAM:  Nonfocal.     Rosanna Randy, MD     CEM/MEDQ  D:  03/16/2011  T:  03/17/2011  Job:  [id number]  cc:   [name]. Swords, MD [address] [place] [address]  Electronically Signed by Vassie Loll MD on 04/03/2011 10:31:25 PM

## 2011-04-06 ENCOUNTER — Telehealth: Payer: Self-pay | Admitting: Oncology

## 2011-04-06 NOTE — Telephone Encounter (Signed)
ADDED LB/FU APPT FOR 12/21 PER 10/26 POF. S/W PT TODAY RE ADD ON - PT TO GET NEW SCHEDULE WHEN SHE COMES IN 11/9.

## 2011-04-09 ENCOUNTER — Other Ambulatory Visit: Payer: Self-pay | Admitting: Oncology

## 2011-04-10 ENCOUNTER — Other Ambulatory Visit: Payer: Self-pay | Admitting: Oncology

## 2011-04-10 ENCOUNTER — Other Ambulatory Visit (HOSPITAL_BASED_OUTPATIENT_CLINIC_OR_DEPARTMENT_OTHER): Payer: Medicare Other | Admitting: Lab

## 2011-04-10 ENCOUNTER — Ambulatory Visit: Payer: Medicare Other

## 2011-04-10 ENCOUNTER — Encounter (HOSPITAL_COMMUNITY)
Admission: RE | Admit: 2011-04-10 | Discharge: 2011-04-10 | Disposition: A | Discharge status: Home / Self Care | Payer: Medicare Other | Source: Ambulatory Visit | Attending: Medical | Admitting: Medical

## 2011-04-10 ENCOUNTER — Ambulatory Visit: Payer: Medicare Other | Admitting: Internal Medicine

## 2011-04-10 VITALS — BP 174/72 | HR 61 | Temp 98.1°F | Resp 18

## 2011-04-10 DIAGNOSIS — D696 Thrombocytopenia, unspecified: Secondary | ICD-10-CM

## 2011-04-10 DIAGNOSIS — D4620 Refractory anemia with excess of blasts, unspecified: Secondary | ICD-10-CM

## 2011-04-10 DIAGNOSIS — D469 Myelodysplastic syndrome, unspecified: Secondary | ICD-10-CM

## 2011-04-10 DIAGNOSIS — D649 Anemia, unspecified: Secondary | ICD-10-CM | POA: Insufficient documentation

## 2011-04-10 LAB — CBC WITH DIFFERENTIAL/PLATELET
Basophils Absolute: 0 10*3/uL (ref 0.0–0.1)
Eosinophils Absolute: 0 10*3/uL (ref 0.0–0.5)
HGB: 8 g/dL — ABNORMAL LOW (ref 11.6–15.9)
MCV: 94.9 fL (ref 79.5–101.0)
MONO#: 0 10*3/uL — ABNORMAL LOW (ref 0.1–0.9)
MONO%: 2.6 % (ref 0.0–14.0)
NEUT#: 0.6 10*3/uL — ABNORMAL LOW (ref 1.5–6.5)
Platelets: 13 10*3/uL — ABNORMAL LOW (ref 145–400)
RDW: 20.7 % — ABNORMAL HIGH (ref 11.2–14.5)
WBC: 1.5 10*3/uL — ABNORMAL LOW (ref 3.9–10.3)

## 2011-04-10 MED ORDER — ACETAMINOPHEN 325 MG PO TABS
650.0000 mg | ORAL_TABLET | Freq: Once | ORAL | Status: AC
  Administered 2011-04-10: 650 mg via ORAL

## 2011-04-10 MED ORDER — SODIUM CHLORIDE 0.9 % IV SOLN
Freq: Once | INTRAVENOUS | Status: AC
  Administered 2011-04-10: 10:00:00 via INTRAVENOUS

## 2011-04-10 MED ORDER — DIPHENHYDRAMINE HCL 25 MG PO CAPS
25.0000 mg | ORAL_CAPSULE | Freq: Once | ORAL | Status: AC
  Administered 2011-04-10: 25 mg via ORAL

## 2011-04-11 LAB — TYPE AND SCREEN
ABO/RH(D): O NEG
Antibody Screen: NEGATIVE
Unit division: 0

## 2011-04-14 ENCOUNTER — Telehealth: Payer: Self-pay | Admitting: Internal Medicine

## 2011-04-14 MED ORDER — FAMOTIDINE 40 MG PO TABS: 40.0000 mg | ORAL_TABLET | Freq: Every day | ORAL | Status: DC

## 2011-04-14 MED ORDER — FELODIPINE ER 5 MG PO TB24: 5.0000 mg | ORAL_TABLET | Freq: Every day | ORAL | Status: DC

## 2011-04-14 NOTE — Telephone Encounter (Signed)
rx sent in electronically 

## 2011-04-14 NOTE — Telephone Encounter (Signed)
Pt called and said that he was in hospital and was discharged on 03/16/11 for infection and inflamation in intestines, caused by over treatment of abx. Pt has been taken off of Lisinopril and it was replaced to  famotidine (PEPCID) 40 MG tablet, Felodipine 10 mg,hydrALAZINE (APRESOLINE) 50 mg . Pt needs this called in to [facility] mail order pharmacy [phone].

## 2011-04-15 ENCOUNTER — Other Ambulatory Visit: Payer: Self-pay | Admitting: *Deleted

## 2011-04-15 MED ORDER — FELODIPINE ER 10 MG PO TB24: 10.0000 mg | ORAL_TABLET | Freq: Every day | ORAL | Status: AC

## 2011-04-16 ENCOUNTER — Other Ambulatory Visit: Payer: Self-pay | Admitting: *Deleted

## 2011-04-16 ENCOUNTER — Telehealth: Payer: Self-pay | Admitting: Internal Medicine

## 2011-04-16 MED ORDER — FAMOTIDINE 40 MG PO TABS: 40.0000 mg | ORAL_TABLET | Freq: Every day | ORAL | Status: DC

## 2011-04-16 MED ORDER — HYDRALAZINE HCL 25 MG PO TABS: 25.0000 mg | ORAL_TABLET | Freq: Three times a day (TID) | ORAL | Status: DC

## 2011-04-16 NOTE — Telephone Encounter (Signed)
Pt needs a 30 day supply of famotidine 40 mg sent in to [facility] while she waits on her mail order, rx sent in electronically, pt aware

## 2011-04-16 NOTE — Telephone Encounter (Signed)
Pt called and said that scripts were sent to wrong pharmacy.Pt needs this called in to [facility] mail order pharmacy [phone].  famotidine (PEPCID) 40 MG tablet, Felodipine 10 mg,hydrALAZINE (APRESOLINE) 50 mg .

## 2011-04-16 NOTE — Telephone Encounter (Signed)
rx sent in electronically 

## 2011-04-21 ENCOUNTER — Other Ambulatory Visit: Payer: Self-pay | Admitting: Oncology

## 2011-04-21 DIAGNOSIS — D469 Myelodysplastic syndrome, unspecified: Secondary | ICD-10-CM

## 2011-04-24 ENCOUNTER — Other Ambulatory Visit: Payer: Self-pay | Admitting: Oncology

## 2011-04-24 ENCOUNTER — Other Ambulatory Visit (HOSPITAL_BASED_OUTPATIENT_CLINIC_OR_DEPARTMENT_OTHER): Payer: Medicare Other

## 2011-04-24 DIAGNOSIS — D696 Thrombocytopenia, unspecified: Secondary | ICD-10-CM

## 2011-04-24 DIAGNOSIS — D4620 Refractory anemia with excess of blasts, unspecified: Secondary | ICD-10-CM

## 2011-04-24 LAB — CBC WITH DIFFERENTIAL/PLATELET
Basophils Absolute: 0 10*3/uL (ref 0.0–0.1)
EOS%: 0.5 % (ref 0.0–7.0)
HCT: 25.7 % — ABNORMAL LOW (ref 34.8–46.6)
HGB: 8.4 g/dL — ABNORMAL LOW (ref 11.6–15.9)
MCH: 30.2 pg (ref 25.1–34.0)
MCV: 92.4 fL (ref 79.5–101.0)
NEUT%: 44.5 % (ref 38.4–76.8)
lymph#: 1 10*3/uL (ref 0.9–3.3)

## 2011-04-28 ENCOUNTER — Other Ambulatory Visit: Payer: Self-pay | Admitting: Oncology

## 2011-04-28 ENCOUNTER — Telehealth: Payer: Self-pay | Admitting: *Deleted

## 2011-04-28 DIAGNOSIS — D469 Myelodysplastic syndrome, unspecified: Secondary | ICD-10-CM

## 2011-04-28 NOTE — Telephone Encounter (Signed)
Caretaker phil calling to say patient is feeling sob and weaker. Per dr Clelia Croft, come in fri nov 30th 12:00 for cbc and 1:15 for blood transfusion. Okay per michelle in infusion, if patient should get more sob and feel worse, report to E.R. [name] verbalizes understanding.

## 2011-04-29 ENCOUNTER — Telehealth: Payer: Self-pay | Admitting: Internal Medicine

## 2011-04-29 NOTE — Telephone Encounter (Signed)
Refill Hydrocodone to [facility]. Thanks.

## 2011-05-01 ENCOUNTER — Ambulatory Visit (HOSPITAL_BASED_OUTPATIENT_CLINIC_OR_DEPARTMENT_OTHER): Payer: Medicare Other

## 2011-05-01 ENCOUNTER — Other Ambulatory Visit: Payer: Medicare Other | Admitting: Lab

## 2011-05-01 VITALS — BP 168/70 | HR 57 | Temp 98.4°F

## 2011-05-01 DIAGNOSIS — D469 Myelodysplastic syndrome, unspecified: Secondary | ICD-10-CM

## 2011-05-01 DIAGNOSIS — D696 Thrombocytopenia, unspecified: Secondary | ICD-10-CM

## 2011-05-01 MED ORDER — ACETAMINOPHEN 325 MG PO TABS
650.0000 mg | ORAL_TABLET | Freq: Once | ORAL | Status: AC
  Administered 2011-05-01: 650 mg via ORAL

## 2011-05-01 MED ORDER — DIPHENHYDRAMINE HCL 50 MG/ML IJ SOLN
25.0000 mg | Freq: Once | INTRAMUSCULAR | Status: AC
  Administered 2011-05-01: 25 mg via INTRAVENOUS

## 2011-05-01 NOTE — Telephone Encounter (Signed)
LMTCB

## 2011-05-02 ENCOUNTER — Encounter (HOSPITAL_COMMUNITY)
Admission: RE | Admit: 2011-05-02 | Discharge: 2011-05-02 | Disposition: A | Discharge status: Home / Self Care | Payer: Medicare Other | Source: Ambulatory Visit | Attending: Medical | Admitting: Medical

## 2011-05-02 DIAGNOSIS — D649 Anemia, unspecified: Secondary | ICD-10-CM | POA: Insufficient documentation

## 2011-05-04 LAB — TYPE AND SCREEN

## 2011-05-04 MED ORDER — HYDROCODONE-ACETAMINOPHEN 5-500 MG PO TABS: ORAL_TABLET | ORAL | Status: DC

## 2011-05-04 NOTE — Telephone Encounter (Signed)
[facility] never received refill.  Printed rx and faxed it to [facility].  Ok per Dr [name]

## 2011-05-05 ENCOUNTER — Telehealth: Payer: Self-pay | Admitting: Internal Medicine

## 2011-05-05 NOTE — Telephone Encounter (Signed)
Pt called about refill on HYDROcodone-acetaminophen (VICODIN) 5-500 MG per tablet pt rx is called in for 1/2 tablet 2 times daily. Pt stated that she needs more then that and is taking 3 daily. [facility] will not refill rx because she is using them to quickly. Pt also stated " a doctor told her to take 4 daily" but does not want to take that many so she takes 3. Please contact pt

## 2011-05-14 ENCOUNTER — Other Ambulatory Visit: Payer: Self-pay | Admitting: *Deleted

## 2011-05-14 ENCOUNTER — Encounter: Payer: Self-pay | Admitting: *Deleted

## 2011-05-15 ENCOUNTER — Other Ambulatory Visit: Payer: Self-pay | Admitting: Oncology

## 2011-05-15 ENCOUNTER — Other Ambulatory Visit (HOSPITAL_BASED_OUTPATIENT_CLINIC_OR_DEPARTMENT_OTHER): Payer: Medicare Other | Admitting: Lab

## 2011-05-15 DIAGNOSIS — D61818 Other pancytopenia: Secondary | ICD-10-CM

## 2011-05-15 DIAGNOSIS — D4620 Refractory anemia with excess of blasts, unspecified: Secondary | ICD-10-CM

## 2011-05-15 DIAGNOSIS — D696 Thrombocytopenia, unspecified: Secondary | ICD-10-CM

## 2011-05-15 LAB — CBC WITH DIFFERENTIAL/PLATELET
BASO%: 0.4 % (ref 0.0–2.0)
Eosinophils Absolute: 0 10*3/uL (ref 0.0–0.5)
LYMPH%: 62.2 % — ABNORMAL HIGH (ref 14.0–49.7)
MCHC: 34.8 g/dL (ref 31.5–36.0)
MONO#: 0.1 10*3/uL (ref 0.1–0.9)
NEUT#: 0.8 10*3/uL — ABNORMAL LOW (ref 1.5–6.5)
Platelets: 14 10*3/uL — ABNORMAL LOW (ref 145–400)
RBC: 2.48 10*6/uL — ABNORMAL LOW (ref 3.70–5.45)
RDW: 23 % — ABNORMAL HIGH (ref 11.2–14.5)
WBC: 2.4 10*3/uL — ABNORMAL LOW (ref 3.9–10.3)
lymph#: 1.5 10*3/uL (ref 0.9–3.3)

## 2011-05-19 ENCOUNTER — Telehealth: Payer: Self-pay | Admitting: *Deleted

## 2011-05-19 NOTE — Telephone Encounter (Signed)
Dental office called.  Patient there for evaluation and cleaning.  Asked if she needs to be pre-medicated with antibiotics.  Patient reports being hospitalized and septic recently.  Also is allergic to what they use to pre-medicate (amoxicillin and clindamycin).      1. Asked if Dr. Clelia Croft will clear patient for dental cleanings and let their office know when she can have th is done. 2. Also asked if Dr. Clelia Croft will give patient a prescription if he feels she needs to be pre-medicated because she is allergic to PCN, Sulfa and all Mycins.    Will notify providers. Patient was her 05-15-11 and these labs mentioned to [facility].  Patient due to return 05-22-11 for f/u.

## 2011-05-19 NOTE — Telephone Encounter (Signed)
Order received and read back from Dr. Clelia Croft for patient to use a toothbrush at home to clean her teeth because she may not have teeth cleaned, does not need to be premedicated and he will not prescribe an anti-biotic.  Called Ms. Kitt and notified her of these orders.  Notified becky that patient is not cleared for dental cleanings.  Becky asked that we call when she is better and may have teeth cleanings.

## 2011-05-22 ENCOUNTER — Encounter (HOSPITAL_COMMUNITY)
Admission: RE | Admit: 2011-05-22 | Discharge: 2011-05-22 | Disposition: A | Discharge status: Home / Self Care | Payer: Medicare Other | Source: Ambulatory Visit | Attending: Oncology | Admitting: Oncology

## 2011-05-22 ENCOUNTER — Ambulatory Visit: Payer: Medicare Other

## 2011-05-22 ENCOUNTER — Other Ambulatory Visit: Payer: Self-pay | Admitting: Oncology

## 2011-05-22 ENCOUNTER — Other Ambulatory Visit (HOSPITAL_BASED_OUTPATIENT_CLINIC_OR_DEPARTMENT_OTHER): Payer: Medicare Other | Admitting: Lab

## 2011-05-22 ENCOUNTER — Ambulatory Visit (HOSPITAL_BASED_OUTPATIENT_CLINIC_OR_DEPARTMENT_OTHER): Payer: Medicare Other

## 2011-05-22 ENCOUNTER — Telehealth: Payer: Self-pay | Admitting: Oncology

## 2011-05-22 ENCOUNTER — Ambulatory Visit (HOSPITAL_BASED_OUTPATIENT_CLINIC_OR_DEPARTMENT_OTHER): Payer: Medicare Other | Admitting: Oncology

## 2011-05-22 DIAGNOSIS — Z86010 Personal history of colonic polyps: Secondary | ICD-10-CM

## 2011-05-22 DIAGNOSIS — M810 Age-related osteoporosis without current pathological fracture: Secondary | ICD-10-CM

## 2011-05-22 DIAGNOSIS — I10 Essential (primary) hypertension: Secondary | ICD-10-CM

## 2011-05-22 DIAGNOSIS — K449 Diaphragmatic hernia without obstruction or gangrene: Secondary | ICD-10-CM

## 2011-05-22 DIAGNOSIS — E785 Hyperlipidemia, unspecified: Secondary | ICD-10-CM

## 2011-05-22 DIAGNOSIS — D469 Myelodysplastic syndrome, unspecified: Secondary | ICD-10-CM

## 2011-05-22 DIAGNOSIS — I5020 Unspecified systolic (congestive) heart failure: Secondary | ICD-10-CM

## 2011-05-22 DIAGNOSIS — F341 Dysthymic disorder: Secondary | ICD-10-CM

## 2011-05-22 DIAGNOSIS — Z8719 Personal history of other diseases of the digestive system: Secondary | ICD-10-CM

## 2011-05-22 DIAGNOSIS — D4620 Refractory anemia with excess of blasts, unspecified: Secondary | ICD-10-CM

## 2011-05-22 DIAGNOSIS — C649 Malignant neoplasm of unspecified kidney, except renal pelvis: Secondary | ICD-10-CM

## 2011-05-22 DIAGNOSIS — D649 Anemia, unspecified: Secondary | ICD-10-CM

## 2011-05-22 DIAGNOSIS — N189 Chronic kidney disease, unspecified: Secondary | ICD-10-CM

## 2011-05-22 DIAGNOSIS — I6350 Cerebral infarction due to unspecified occlusion or stenosis of unspecified cerebral artery: Secondary | ICD-10-CM

## 2011-05-22 DIAGNOSIS — K219 Gastro-esophageal reflux disease without esophagitis: Secondary | ICD-10-CM

## 2011-05-22 DIAGNOSIS — K5730 Diverticulosis of large intestine without perforation or abscess without bleeding: Secondary | ICD-10-CM

## 2011-05-22 DIAGNOSIS — H409 Unspecified glaucoma: Secondary | ICD-10-CM

## 2011-05-22 DIAGNOSIS — M109 Gout, unspecified: Secondary | ICD-10-CM

## 2011-05-22 DIAGNOSIS — D696 Thrombocytopenia, unspecified: Secondary | ICD-10-CM

## 2011-05-22 LAB — CBC WITH DIFFERENTIAL/PLATELET
BASO%: 0.1 % (ref 0.0–2.0)
Basophils Absolute: 0 10*3/uL (ref 0.0–0.1)
Eosinophils Absolute: 0 10*3/uL (ref 0.0–0.5)
HCT: 20.6 % — ABNORMAL LOW (ref 34.8–46.6)
HGB: 7.1 g/dL — ABNORMAL LOW (ref 11.6–15.9)
LYMPH%: 53.9 % — ABNORMAL HIGH (ref 14.0–49.7)
MCHC: 34.5 g/dL (ref 31.5–36.0)
MONO#: 0 10*3/uL — ABNORMAL LOW (ref 0.1–0.9)
NEUT#: 0.7 10*3/uL — ABNORMAL LOW (ref 1.5–6.5)
NEUT%: 42.3 % (ref 38.4–76.8)
Platelets: 11 10*3/uL — ABNORMAL LOW (ref 145–400)
WBC: 1.7 10*3/uL — ABNORMAL LOW (ref 3.9–10.3)
lymph#: 0.9 10*3/uL (ref 0.9–3.3)

## 2011-05-22 MED ORDER — DIPHENHYDRAMINE HCL 50 MG/ML IJ SOLN
25.0000 mg | Freq: Once | INTRAMUSCULAR | Status: AC
  Administered 2011-05-22: 25 mg via INTRAVENOUS

## 2011-05-22 MED ORDER — ACETAMINOPHEN 325 MG PO TABS
650.0000 mg | ORAL_TABLET | Freq: Once | ORAL | Status: AC
  Administered 2011-05-22: 650 mg via ORAL

## 2011-05-22 NOTE — Telephone Encounter (Signed)
appt made for 06/19/11 appt,michelle to add bld for 1/4 and 1/18 and will print schedule    aom

## 2011-05-22 NOTE — Progress Notes (Signed)
Hematology and Oncology Follow Up Visit  [name] [id number] 08/31/1925 77 y.o. 05/22/2011 9:19 AM  CC: [name]. Denise Mulligan, MD  [name], M.D.    Principle Diagnosis: is an 77 year old female with the following issues:  1. Myelodysplastic syndrome, presented with pancytopenia, normal cytogenetics, diagnosed in 2010.  2. History of renal cell carcinoma, status post radiofrequency ablation.    Current therapy: Growth factor support with Procrit 40,000 units every 2 weeks to keep her hemoglobin above 11, as well as supportive packed red cell transfusion.  She is really not a candidate for any aggressive therapy.   Interim History:  [name] presents today for a followup visit.  She is an elderly frail woman with multiple comorbid conditions.  Sh  She is not reporting any further fever, is not reporting any signs of any infectious process at this time.  She is quite fatigued, but is recovering rather slowly at this time. She reported no bleeding at this time. No nausea or vomiting. She is eating better at this time. No recent hospitalization or infection at this time. She is reporting more fatigue than usual, some DOE as well as shortness of breath with lengthy walks and exercise.    Medications: I have reviewed the patient's current medications. Current outpatient prescriptions:ALPRAZolam (XANAX) 0.5 MG tablet, Take 1 tablet (0.5 mg total) by mouth at bedtime as needed., Disp: 90 tablet, Rfl: 0;  bisoprolol (ZEBETA) 10 MG tablet, TAKE 1 TABLET ONCE DAILY, Disp: 90 tablet, Rfl: 2;  famotidine (PEPCID) 40 MG tablet, Take 1 tablet (40 mg total) by mouth daily., Disp: 30 tablet, Rfl: 0 felodipine (PLENDIL) 10 MG 24 hr tablet, Take 1 tablet (10 mg total) by mouth daily., Disp: 90 tablet, Rfl: 3;  folic acid (FOLVITE) 1 MG tablet, Take 1 tablet (1 mg total) by mouth daily., Disp: 90 tablet, Rfl: 1;  hydrALAZINE (APRESOLINE) 25 MG tablet, Take 1 tablet (25 mg total) by mouth 3 (three)  times daily., Disp: 270 tablet, Rfl: 1;  HYDROcodone-acetaminophen (VICODIN) 5-500 MG per tablet, 1/2 tablet two times daily, Disp: 90 tablet, Rfl: 0 metroNIDAZOLE (FLAGYL) 500 MG tablet, Take 1 tablet (500 mg total) by mouth every 8 (eight) hours., Disp: 270 tablet, Rfl: 3;  Probiotic Product (ALIGN) 4 MG CAPS, Take by mouth daily. , Disp: , Rfl: ;  ULORIC 40 MG tablet, Take 1 tablet by mouth daily., Disp: , Rfl:   Allergies:  Allergies  Allergen Reactions  . Clonidine Hydrochloride     REACTION: Reaction per patient, can't take.  Also references hive on forehead \\T \ spts on chest from other meds  . Doxazosin Mesylate     REACTION: holding due to itching  . Doxycycline Hyclate     REACTION: rash  . Erythromycin Ethylsuccinate     REACTION: unspecified  . Meperidine Hcl     REACTION: unspecified  . Penicillins     REACTION: unspecified  . Sulfonamide Derivatives     REACTION: unspecified    Past Medical History, Surgical history, Social history, and Family History were reviewed and updated.  Review of Systems: Constitutional:  Negative for fever, chills, night sweats, anorexia, weight loss, pain. Cardiovascular: no chest pain or dyspnea on exertion Respiratory: no cough, shortness of breath, or wheezing Neurological: no TIA or stroke symptoms Dermatological: negative ENT: negative Skin: Negative. Gastrointestinal: no abdominal pain, change in bowel habits, or black or bloody stools Genito-Urinary: no dysuria, trouble voiding, or hematuria Hematological and Lymphatic: negative Breast: negative Musculoskeletal:  negative Remaining ROS negative. Physical Exam: Blood pressure 185/60, pulse 57, temperature 97.1 F (36.2 C), temperature source Oral, height 5\' 5"  (1.651 m), weight 130 lb 8 oz (59.194 kg). ECOG: 2 General appearance: alert Head: Normocephalic, without obvious abnormality, atraumatic Neck: no adenopathy, no carotid bruit, no JVD, supple, symmetrical, trachea midline  and thyroid not enlarged, symmetric, no tenderness/mass/nodules Lymph nodes: Cervical, supraclavicular, and axillary nodes normal. Heart:regular rate and rhythm, S1, S2 normal, no murmur, click, rub or gallop Lung:chest clear, no wheezing, rales, normal symmetric air entry Abdomin: soft, non-tender, without masses or organomegaly EXT:no erythema, induration, or nodules   Lab Results: Lab Results  Component Value Date   WBC 1.7* 05/22/2011   HGB 7.1* 05/22/2011   HCT 20.6* 05/22/2011   MCV 95.1 05/22/2011   PLT 11* 05/22/2011     Chemistry      Component Value Date/Time   NA 141 03/24/2011 1134   K 5.2* 03/24/2011 1134   CL 112 03/24/2011 1134   CO2 23 03/24/2011 1134   BUN 51* 03/24/2011 1134   CREATININE 2.2* 03/24/2011 1134      Component Value Date/Time   CALCIUM 9.4 03/24/2011 1134   ALKPHOS 138* 03/10/2011 1830   AST 13 03/10/2011 1830   ALT 5 03/10/2011 1830   BILITOT 0.4 03/10/2011 1830       Impression and Plan:  This is an 77 year old woman with the following issues:  1. Myelodysplastic syndrome with pancytopenia.  [name] is not really a candidate for any aggressive therapy such as Revlimid or 5-azacytidine.  She has a very poor performance status and her preference is to do supportive measures only and which we will continue with packed red cell transfusions and growth factor support as needed. She will receive PRBC transfusion today given her symptomatic anemia. 2. Thrombocytopenia with platelet count of 10,000.  We will transfuse only if she is having active bleeding.   3. Recurrent urinary tract infection.  Again, I have urged her to see urology regarding that issue. It seems to be stable at this point.  [facility], MD 12/21/20129:19 AM

## 2011-05-23 LAB — TYPE AND SCREEN: Unit division: 0

## 2011-05-25 ENCOUNTER — Telehealth: Payer: Self-pay

## 2011-05-25 NOTE — Telephone Encounter (Signed)
Pt calling to see about paperwork to be filled out for the DMV.  Pt states she received a letter from the [facility] stating that her placard will be expired.  Pls advise.

## 2011-05-26 ENCOUNTER — Encounter (HOSPITAL_COMMUNITY): Payer: Medicare Other

## 2011-05-27 NOTE — Telephone Encounter (Signed)
Form filled out, faxed, and mailed to the other physician

## 2011-06-01 ENCOUNTER — Encounter (HOSPITAL_COMMUNITY): Payer: Medicare Other

## 2011-06-03 ENCOUNTER — Other Ambulatory Visit: Payer: Self-pay | Admitting: Interventional Radiology

## 2011-06-03 DIAGNOSIS — N2889 Other specified disorders of kidney and ureter: Secondary | ICD-10-CM

## 2011-06-03 DIAGNOSIS — N281 Cyst of kidney, acquired: Secondary | ICD-10-CM

## 2011-06-04 ENCOUNTER — Other Ambulatory Visit: Payer: Self-pay | Admitting: Oncology

## 2011-06-04 DIAGNOSIS — M109 Gout, unspecified: Secondary | ICD-10-CM

## 2011-06-04 DIAGNOSIS — N189 Chronic kidney disease, unspecified: Secondary | ICD-10-CM

## 2011-06-04 DIAGNOSIS — I6350 Cerebral infarction due to unspecified occlusion or stenosis of unspecified cerebral artery: Secondary | ICD-10-CM

## 2011-06-04 DIAGNOSIS — K219 Gastro-esophageal reflux disease without esophagitis: Secondary | ICD-10-CM

## 2011-06-04 DIAGNOSIS — K449 Diaphragmatic hernia without obstruction or gangrene: Secondary | ICD-10-CM

## 2011-06-04 DIAGNOSIS — M810 Age-related osteoporosis without current pathological fracture: Secondary | ICD-10-CM

## 2011-06-04 DIAGNOSIS — D469 Myelodysplastic syndrome, unspecified: Secondary | ICD-10-CM

## 2011-06-04 DIAGNOSIS — K5730 Diverticulosis of large intestine without perforation or abscess without bleeding: Secondary | ICD-10-CM

## 2011-06-04 DIAGNOSIS — E785 Hyperlipidemia, unspecified: Secondary | ICD-10-CM

## 2011-06-04 DIAGNOSIS — I5020 Unspecified systolic (congestive) heart failure: Secondary | ICD-10-CM

## 2011-06-04 DIAGNOSIS — Z8719 Personal history of other diseases of the digestive system: Secondary | ICD-10-CM

## 2011-06-04 DIAGNOSIS — Z86010 Personal history of colonic polyps: Secondary | ICD-10-CM

## 2011-06-04 DIAGNOSIS — C649 Malignant neoplasm of unspecified kidney, except renal pelvis: Secondary | ICD-10-CM

## 2011-06-04 DIAGNOSIS — H409 Unspecified glaucoma: Secondary | ICD-10-CM

## 2011-06-04 DIAGNOSIS — F341 Dysthymic disorder: Secondary | ICD-10-CM

## 2011-06-04 DIAGNOSIS — I10 Essential (primary) hypertension: Secondary | ICD-10-CM

## 2011-06-05 ENCOUNTER — Ambulatory Visit (HOSPITAL_BASED_OUTPATIENT_CLINIC_OR_DEPARTMENT_OTHER): Payer: Medicare Other

## 2011-06-05 ENCOUNTER — Encounter (HOSPITAL_COMMUNITY)
Admission: RE | Admit: 2011-06-05 | Discharge: 2011-06-05 | Disposition: A | Discharge status: Home / Self Care | Payer: Medicare Other | Source: Ambulatory Visit | Attending: Medical | Admitting: Medical

## 2011-06-05 ENCOUNTER — Other Ambulatory Visit: Payer: Medicare Other | Admitting: Lab

## 2011-06-05 DIAGNOSIS — H409 Unspecified glaucoma: Secondary | ICD-10-CM

## 2011-06-05 DIAGNOSIS — D469 Myelodysplastic syndrome, unspecified: Secondary | ICD-10-CM

## 2011-06-05 DIAGNOSIS — E785 Hyperlipidemia, unspecified: Secondary | ICD-10-CM

## 2011-06-05 DIAGNOSIS — C649 Malignant neoplasm of unspecified kidney, except renal pelvis: Secondary | ICD-10-CM

## 2011-06-05 DIAGNOSIS — I6350 Cerebral infarction due to unspecified occlusion or stenosis of unspecified cerebral artery: Secondary | ICD-10-CM

## 2011-06-05 DIAGNOSIS — I10 Essential (primary) hypertension: Secondary | ICD-10-CM

## 2011-06-05 DIAGNOSIS — K219 Gastro-esophageal reflux disease without esophagitis: Secondary | ICD-10-CM

## 2011-06-05 DIAGNOSIS — I5020 Unspecified systolic (congestive) heart failure: Secondary | ICD-10-CM

## 2011-06-05 DIAGNOSIS — K449 Diaphragmatic hernia without obstruction or gangrene: Secondary | ICD-10-CM

## 2011-06-05 DIAGNOSIS — N189 Chronic kidney disease, unspecified: Secondary | ICD-10-CM

## 2011-06-05 DIAGNOSIS — Z860100 Personal history of colon polyps, unspecified: Secondary | ICD-10-CM

## 2011-06-05 DIAGNOSIS — D649 Anemia, unspecified: Secondary | ICD-10-CM

## 2011-06-05 DIAGNOSIS — Z8719 Personal history of other diseases of the digestive system: Secondary | ICD-10-CM

## 2011-06-05 DIAGNOSIS — M810 Age-related osteoporosis without current pathological fracture: Secondary | ICD-10-CM

## 2011-06-05 DIAGNOSIS — K5730 Diverticulosis of large intestine without perforation or abscess without bleeding: Secondary | ICD-10-CM

## 2011-06-05 DIAGNOSIS — M109 Gout, unspecified: Secondary | ICD-10-CM

## 2011-06-05 DIAGNOSIS — F341 Dysthymic disorder: Secondary | ICD-10-CM

## 2011-06-05 LAB — COMPREHENSIVE METABOLIC PANEL
Albumin: 3.4 g/dL — ABNORMAL LOW (ref 3.5–5.2)
CO2: 23 mEq/L (ref 19–32)
Calcium: 9.3 mg/dL (ref 8.4–10.5)
Glucose, Bld: 102 mg/dL — ABNORMAL HIGH (ref 70–99)
Potassium: 4.7 mEq/L (ref 3.5–5.3)
Sodium: 141 mEq/L (ref 135–145)
Total Bilirubin: 0.3 mg/dL (ref 0.3–1.2)
Total Protein: 5.8 g/dL — ABNORMAL LOW (ref 6.0–8.3)

## 2011-06-05 LAB — CBC WITH DIFFERENTIAL/PLATELET
Eosinophils Absolute: 0 10*3/uL (ref 0.0–0.5)
LYMPH%: 56.7 % — ABNORMAL HIGH (ref 14.0–49.7)
MONO#: 0 10*3/uL — ABNORMAL LOW (ref 0.1–0.9)
NEUT#: 0.8 10*3/uL — ABNORMAL LOW (ref 1.5–6.5)
Platelets: 12 10*3/uL — ABNORMAL LOW (ref 145–400)
RBC: 2.29 10*6/uL — ABNORMAL LOW (ref 3.70–5.45)
WBC: 1.9 10*3/uL — ABNORMAL LOW (ref 3.9–10.3)

## 2011-06-05 LAB — HOLD TUBE, BLOOD BANK

## 2011-06-05 MED ORDER — DIPHENHYDRAMINE HCL 50 MG/ML IJ SOLN
25.0000 mg | Freq: Once | INTRAMUSCULAR | Status: AC
  Administered 2011-06-05: 25 mg via INTRAVENOUS

## 2011-06-05 MED ORDER — ACETAMINOPHEN 325 MG PO TABS
650.0000 mg | ORAL_TABLET | Freq: Once | ORAL | Status: AC
  Administered 2011-06-05: 650 mg via ORAL

## 2011-06-06 LAB — TYPE AND SCREEN
ABO/RH(D): O NEG
Unit division: 0

## 2011-06-11 ENCOUNTER — Ambulatory Visit: Payer: Medicare Other

## 2011-06-15 ENCOUNTER — Other Ambulatory Visit: Payer: Self-pay | Admitting: Internal Medicine

## 2011-06-15 NOTE — Telephone Encounter (Signed)
Pt need new rxs hydrocodone 5-500mg  3 pills a day also alprazolam one pill at night. Pt fax into [facility] [phone]

## 2011-06-15 NOTE — Telephone Encounter (Signed)
Should pt be taking hydrocodone 3 pills daily?  The directions say 1/2 pill bid

## 2011-06-16 MED ORDER — HYDROCODONE-ACETAMINOPHEN 5-500 MG PO TABS: ORAL_TABLET | ORAL | Status: DC

## 2011-06-16 MED ORDER — ALPRAZOLAM 0.5 MG PO TABS: 0.5000 mg | ORAL_TABLET | Freq: Every evening | ORAL | Status: DC | PRN

## 2011-06-16 NOTE — Telephone Encounter (Signed)
I would be okay with the patient taking a half a tablet of hydrocodone at current dose 3 times a day. Please call patient and give her those instructions. Okay to refill alprazolam at current dose.

## 2011-06-16 NOTE — Telephone Encounter (Signed)
Pt aware, rx's faxed to pharmacy

## 2011-06-18 ENCOUNTER — Encounter: Payer: Self-pay | Admitting: Radiology

## 2011-06-18 ENCOUNTER — Other Ambulatory Visit: Payer: Self-pay | Admitting: Oncology

## 2011-06-18 DIAGNOSIS — Z8719 Personal history of other diseases of the digestive system: Secondary | ICD-10-CM

## 2011-06-18 DIAGNOSIS — I10 Essential (primary) hypertension: Secondary | ICD-10-CM

## 2011-06-18 DIAGNOSIS — M810 Age-related osteoporosis without current pathological fracture: Secondary | ICD-10-CM

## 2011-06-18 DIAGNOSIS — D469 Myelodysplastic syndrome, unspecified: Secondary | ICD-10-CM

## 2011-06-18 DIAGNOSIS — M109 Gout, unspecified: Secondary | ICD-10-CM

## 2011-06-18 DIAGNOSIS — H409 Unspecified glaucoma: Secondary | ICD-10-CM

## 2011-06-18 DIAGNOSIS — N189 Chronic kidney disease, unspecified: Secondary | ICD-10-CM

## 2011-06-18 DIAGNOSIS — K5730 Diverticulosis of large intestine without perforation or abscess without bleeding: Secondary | ICD-10-CM

## 2011-06-18 DIAGNOSIS — I6350 Cerebral infarction due to unspecified occlusion or stenosis of unspecified cerebral artery: Secondary | ICD-10-CM

## 2011-06-18 DIAGNOSIS — K219 Gastro-esophageal reflux disease without esophagitis: Secondary | ICD-10-CM

## 2011-06-18 DIAGNOSIS — Z860100 Personal history of colon polyps, unspecified: Secondary | ICD-10-CM

## 2011-06-18 DIAGNOSIS — F341 Dysthymic disorder: Secondary | ICD-10-CM

## 2011-06-18 DIAGNOSIS — I5020 Unspecified systolic (congestive) heart failure: Secondary | ICD-10-CM

## 2011-06-18 DIAGNOSIS — K449 Diaphragmatic hernia without obstruction or gangrene: Secondary | ICD-10-CM

## 2011-06-18 DIAGNOSIS — E785 Hyperlipidemia, unspecified: Secondary | ICD-10-CM

## 2011-06-18 DIAGNOSIS — C649 Malignant neoplasm of unspecified kidney, except renal pelvis: Secondary | ICD-10-CM

## 2011-06-19 ENCOUNTER — Ambulatory Visit: Payer: Medicare Other | Admitting: Oncology

## 2011-06-19 ENCOUNTER — Other Ambulatory Visit: Payer: Medicare Other | Admitting: Lab

## 2011-06-19 ENCOUNTER — Ambulatory Visit (HOSPITAL_BASED_OUTPATIENT_CLINIC_OR_DEPARTMENT_OTHER): Payer: Medicare Other

## 2011-06-19 DIAGNOSIS — D649 Anemia, unspecified: Secondary | ICD-10-CM

## 2011-06-19 DIAGNOSIS — K449 Diaphragmatic hernia without obstruction or gangrene: Secondary | ICD-10-CM

## 2011-06-19 DIAGNOSIS — Z8719 Personal history of other diseases of the digestive system: Secondary | ICD-10-CM

## 2011-06-19 DIAGNOSIS — C649 Malignant neoplasm of unspecified kidney, except renal pelvis: Secondary | ICD-10-CM

## 2011-06-19 DIAGNOSIS — H409 Unspecified glaucoma: Secondary | ICD-10-CM

## 2011-06-19 DIAGNOSIS — I10 Essential (primary) hypertension: Secondary | ICD-10-CM

## 2011-06-19 DIAGNOSIS — N189 Chronic kidney disease, unspecified: Secondary | ICD-10-CM

## 2011-06-19 DIAGNOSIS — E785 Hyperlipidemia, unspecified: Secondary | ICD-10-CM

## 2011-06-19 DIAGNOSIS — I5020 Unspecified systolic (congestive) heart failure: Secondary | ICD-10-CM

## 2011-06-19 DIAGNOSIS — K219 Gastro-esophageal reflux disease without esophagitis: Secondary | ICD-10-CM

## 2011-06-19 DIAGNOSIS — Z86010 Personal history of colonic polyps: Secondary | ICD-10-CM

## 2011-06-19 DIAGNOSIS — I6350 Cerebral infarction due to unspecified occlusion or stenosis of unspecified cerebral artery: Secondary | ICD-10-CM

## 2011-06-19 DIAGNOSIS — M810 Age-related osteoporosis without current pathological fracture: Secondary | ICD-10-CM

## 2011-06-19 DIAGNOSIS — K5730 Diverticulosis of large intestine without perforation or abscess without bleeding: Secondary | ICD-10-CM

## 2011-06-19 DIAGNOSIS — M109 Gout, unspecified: Secondary | ICD-10-CM

## 2011-06-19 DIAGNOSIS — F341 Dysthymic disorder: Secondary | ICD-10-CM

## 2011-06-19 DIAGNOSIS — D469 Myelodysplastic syndrome, unspecified: Secondary | ICD-10-CM

## 2011-06-19 LAB — CBC WITH DIFFERENTIAL/PLATELET
Basophils Absolute: 0 10*3/uL (ref 0.0–0.1)
Eosinophils Absolute: 0 10*3/uL (ref 0.0–0.5)
HCT: 21.6 % — ABNORMAL LOW (ref 34.8–46.6)
HGB: 7.5 g/dL — ABNORMAL LOW (ref 11.6–15.9)
LYMPH%: 60 % — ABNORMAL HIGH (ref 14.0–49.7)
MCV: 94.5 fL (ref 79.5–101.0)
MONO#: 0 10*3/uL — ABNORMAL LOW (ref 0.1–0.9)
NEUT#: 0.5 10*3/uL — ABNORMAL LOW (ref 1.5–6.5)
Platelets: 11 10*3/uL — ABNORMAL LOW (ref 145–400)
RBC: 2.29 10*6/uL — ABNORMAL LOW (ref 3.70–5.45)
WBC: 1.4 10*3/uL — ABNORMAL LOW (ref 3.9–10.3)

## 2011-06-19 LAB — TYPE AND SCREEN: ABO/RH(D): O NEG

## 2011-06-19 LAB — HOLD TUBE, BLOOD BANK

## 2011-06-19 LAB — COMPREHENSIVE METABOLIC PANEL
AST: 13 U/L (ref 0–37)
Albumin: 3.5 g/dL (ref 3.5–5.2)
Alkaline Phosphatase: 104 U/L (ref 39–117)
BUN: 63 mg/dL — ABNORMAL HIGH (ref 6–23)
Potassium: 5.5 mEq/L — ABNORMAL HIGH (ref 3.5–5.3)
Total Bilirubin: 0.3 mg/dL (ref 0.3–1.2)

## 2011-06-19 LAB — PREPARE RBC (CROSSMATCH)

## 2011-06-19 MED ORDER — DIPHENHYDRAMINE HCL 50 MG/ML IJ SOLN
25.0000 mg | Freq: Once | INTRAMUSCULAR | Status: AC
  Administered 2011-06-19: 25 mg via INTRAVENOUS

## 2011-06-19 MED ORDER — ACETAMINOPHEN 325 MG PO TABS
650.0000 mg | ORAL_TABLET | Freq: Once | ORAL | Status: AC
  Administered 2011-06-19: 650 mg via ORAL

## 2011-06-19 NOTE — Progress Notes (Signed)
Hematology and Oncology Follow Up Visit  [name] [id number] July 04, 1925 78 y.o. 06/19/2011 11:13 AM  CC: [name]. [name], MD  [name], M.D.    Principle Diagnosis: is an 78 year old female with the following issues:  1. Myelodysplastic syndrome, presented with pancytopenia, normal cytogenetics, diagnosed in 2010.  2. History of renal cell carcinoma, status post radiofrequency ablation.    Current therapy: Growth factor support with Procrit 40,000 units every 2 weeks to keep her hemoglobin above 11, as well as supportive packed red cell transfusion.  She is really not a candidate for any aggressive therapy.   Interim History:  [name] presents today for a followup visit.  She is an elderly frail woman with multiple comorbid conditions. She is not reporting any further fever, is not reporting any signs of any infectious process at this time.  She is quite fatigued today and reports no SOB. She reported no bleeding at this time. No nausea or vomiting. She is eating better at this time. No recent hospitalization or infection at this time.  Medications: I have reviewed the patient's current medications. Current outpatient prescriptions:ALPRAZolam (XANAX) 0.5 MG tablet, Take 1 tablet (0.5 mg total) by mouth at bedtime as needed., Disp: 90 tablet, Rfl: 0;  bisoprolol (ZEBETA) 10 MG tablet, TAKE 1 TABLET ONCE DAILY, Disp: 90 tablet, Rfl: 2;  famotidine (PEPCID) 40 MG tablet, Take 1 tablet (40 mg total) by mouth daily., Disp: 30 tablet, Rfl: 0 felodipine (PLENDIL) 10 MG 24 hr tablet, Take 1 tablet (10 mg total) by mouth daily., Disp: 90 tablet, Rfl: 3;  folic acid (FOLVITE) 1 MG tablet, Take 1 tablet (1 mg total) by mouth daily., Disp: 90 tablet, Rfl: 1;  hydrALAZINE (APRESOLINE) 25 MG tablet, Take 1 tablet (25 mg total) by mouth 3 (three) times daily., Disp: 270 tablet, Rfl: 1 HYDROcodone-acetaminophen (VICODIN) 5-500 MG per tablet, 1/2 tablet three times daily, Disp: 180 tablet,  Rfl: 0;  metroNIDAZOLE (FLAGYL) 500 MG tablet, Take 1 tablet (500 mg total) by mouth every 8 (eight) hours., Disp: 270 tablet, Rfl: 3;  Probiotic Product (ALIGN) 4 MG CAPS, Take by mouth daily. , Disp: , Rfl: ;  ULORIC 40 MG tablet, Take 1 tablet by mouth daily., Disp: , Rfl:   Allergies:  Allergies  Allergen Reactions  . Clonidine Hydrochloride     REACTION: Reaction per patient, can't take.  Also references hive on forehead \\T \ spts on chest from other meds  . Doxazosin Mesylate     REACTION: holding due to itching  . Doxycycline Hyclate     REACTION: rash  . Erythromycin Ethylsuccinate     REACTION: unspecified  . Meperidine Hcl     REACTION: unspecified  . Penicillins     REACTION: unspecified  . Sulfonamide Derivatives     REACTION: unspecified    Past Medical History, Surgical history, Social history, and Family History were reviewed and updated.  Review of Systems: Constitutional:  Negative for fever, chills, night sweats, anorexia, weight loss, pain. Cardiovascular: no chest pain or dyspnea on exertion Respiratory: no cough, shortness of breath, or wheezing Neurological: no TIA or stroke symptoms Dermatological: negative ENT: negative Skin: Negative. Gastrointestinal: no abdominal pain, change in bowel habits, or black or bloody stools Genito-Urinary: no dysuria, trouble voiding, or hematuria Hematological and Lymphatic: negative Breast: negative Musculoskeletal: negative Remaining ROS negative. Physical Exam: Blood pressure 142/59, pulse 56, temperature 97.7 F (36.5 C), temperature source Oral, height 5\' 5"  (1.651 m), weight 127 lb 6.4  oz (57.788 kg). ECOG: 2 General appearance: alert Head: Normocephalic, without obvious abnormality, atraumatic Neck: no adenopathy, no carotid bruit, no JVD, supple, symmetrical, trachea midline and thyroid not enlarged, symmetric, no tenderness/mass/nodules Lymph nodes: Cervical, supraclavicular, and axillary nodes  normal. Heart:regular rate and rhythm, S1, S2 normal, no murmur, click, rub or gallop Lung:chest clear, no wheezing, rales, normal symmetric air entry Abdomin: soft, non-tender, without masses or organomegaly EXT:no erythema, induration, or nodules   Lab Results: Lab Results  Component Value Date   WBC 1.4* 06/19/2011   HGB 7.5* 06/19/2011   HCT 21.6* 06/19/2011   MCV 94.5 06/19/2011   PLT 11* 06/19/2011     Chemistry      Component Value Date/Time   NA 141 06/05/2011 0957   K 4.7 06/05/2011 0957   CL 111 06/05/2011 0957   CO2 23 06/05/2011 0957   BUN 54* 06/05/2011 0957   CREATININE 2.00* 06/05/2011 0957      Component Value Date/Time   CALCIUM 9.3 06/05/2011 0957   ALKPHOS 108 06/05/2011 0957   AST 13 06/05/2011 0957   ALT <8 06/05/2011 0957   BILITOT 0.3 06/05/2011 0957       Impression and Plan:  This is an 78 year old woman with the following issues:  1. Myelodysplastic syndrome with pancytopenia.  [name] is not really a candidate for any aggressive therapy such as Revlimid or 5-azacytidine.  She has a very poor performance status and her preference is to do supportive measures only and which we will continue with packed red cell transfusions and growth factor support as needed. She will receive PRBC transfusion today given her symptomatic anemia. She will likely need Q 2 weeks transfusions. 2. Thrombocytopenia with platelet count of 10,000.  We will transfuse only if she is having active bleeding.   3. Recurrent urinary tract infection. Improved at this time.  [facility], MD 1/18/201311:13 AM

## 2011-06-25 ENCOUNTER — Telehealth: Payer: Self-pay | Admitting: *Deleted

## 2011-06-25 NOTE — Telephone Encounter (Signed)
Received call from pt stating that her feet have been swelling and she has been having nose bleeds for the last few days. Instructed pt to elevate her legs while sitting and lying in bed to see if that helps the swelling. Notified MD who states pt should just continue to monitor for major changes and that the nose bleeds are most likely coming from low platelets so she may need a platelet transfusion soon. Returned call to pt at home to give above information. Verbalized understanding

## 2011-06-28 ENCOUNTER — Other Ambulatory Visit: Payer: Self-pay | Admitting: Oncology

## 2011-06-28 DIAGNOSIS — Z86010 Personal history of colonic polyps: Secondary | ICD-10-CM

## 2011-06-28 DIAGNOSIS — H409 Unspecified glaucoma: Secondary | ICD-10-CM

## 2011-06-28 DIAGNOSIS — K449 Diaphragmatic hernia without obstruction or gangrene: Secondary | ICD-10-CM

## 2011-06-28 DIAGNOSIS — M109 Gout, unspecified: Secondary | ICD-10-CM

## 2011-06-28 DIAGNOSIS — E785 Hyperlipidemia, unspecified: Secondary | ICD-10-CM

## 2011-06-28 DIAGNOSIS — N189 Chronic kidney disease, unspecified: Secondary | ICD-10-CM

## 2011-06-28 DIAGNOSIS — K219 Gastro-esophageal reflux disease without esophagitis: Secondary | ICD-10-CM

## 2011-06-28 DIAGNOSIS — F341 Dysthymic disorder: Secondary | ICD-10-CM

## 2011-06-28 DIAGNOSIS — Z8719 Personal history of other diseases of the digestive system: Secondary | ICD-10-CM

## 2011-06-28 DIAGNOSIS — C649 Malignant neoplasm of unspecified kidney, except renal pelvis: Secondary | ICD-10-CM

## 2011-06-28 DIAGNOSIS — D469 Myelodysplastic syndrome, unspecified: Secondary | ICD-10-CM

## 2011-06-28 DIAGNOSIS — M810 Age-related osteoporosis without current pathological fracture: Secondary | ICD-10-CM

## 2011-06-28 DIAGNOSIS — I6350 Cerebral infarction due to unspecified occlusion or stenosis of unspecified cerebral artery: Secondary | ICD-10-CM

## 2011-06-28 DIAGNOSIS — I5020 Unspecified systolic (congestive) heart failure: Secondary | ICD-10-CM

## 2011-06-28 DIAGNOSIS — I10 Essential (primary) hypertension: Secondary | ICD-10-CM

## 2011-06-28 DIAGNOSIS — K5730 Diverticulosis of large intestine without perforation or abscess without bleeding: Secondary | ICD-10-CM

## 2011-07-01 ENCOUNTER — Other Ambulatory Visit: Payer: Self-pay | Admitting: *Deleted

## 2011-07-01 MED ORDER — FAMOTIDINE 40 MG PO TABS: 40.0000 mg | ORAL_TABLET | Freq: Every day | ORAL | Status: AC

## 2011-07-03 ENCOUNTER — Other Ambulatory Visit: Payer: Self-pay | Admitting: *Deleted

## 2011-07-03 ENCOUNTER — Other Ambulatory Visit (HOSPITAL_BASED_OUTPATIENT_CLINIC_OR_DEPARTMENT_OTHER): Payer: Medicare Other | Admitting: Lab

## 2011-07-03 ENCOUNTER — Encounter (HOSPITAL_COMMUNITY)
Admission: RE | Admit: 2011-07-03 | Discharge: 2011-07-03 | Disposition: A | Discharge status: Home / Self Care | Payer: Medicare Other | Source: Ambulatory Visit | Attending: Medical | Admitting: Medical

## 2011-07-03 ENCOUNTER — Ambulatory Visit (HOSPITAL_BASED_OUTPATIENT_CLINIC_OR_DEPARTMENT_OTHER): Payer: Medicare Other

## 2011-07-03 DIAGNOSIS — K449 Diaphragmatic hernia without obstruction or gangrene: Secondary | ICD-10-CM

## 2011-07-03 DIAGNOSIS — I6350 Cerebral infarction due to unspecified occlusion or stenosis of unspecified cerebral artery: Secondary | ICD-10-CM

## 2011-07-03 DIAGNOSIS — D649 Anemia, unspecified: Secondary | ICD-10-CM

## 2011-07-03 DIAGNOSIS — Z860100 Personal history of colon polyps, unspecified: Secondary | ICD-10-CM

## 2011-07-03 DIAGNOSIS — C649 Malignant neoplasm of unspecified kidney, except renal pelvis: Secondary | ICD-10-CM

## 2011-07-03 DIAGNOSIS — K219 Gastro-esophageal reflux disease without esophagitis: Secondary | ICD-10-CM

## 2011-07-03 DIAGNOSIS — F341 Dysthymic disorder: Secondary | ICD-10-CM

## 2011-07-03 DIAGNOSIS — M109 Gout, unspecified: Secondary | ICD-10-CM

## 2011-07-03 DIAGNOSIS — Z8719 Personal history of other diseases of the digestive system: Secondary | ICD-10-CM

## 2011-07-03 DIAGNOSIS — K5730 Diverticulosis of large intestine without perforation or abscess without bleeding: Secondary | ICD-10-CM

## 2011-07-03 DIAGNOSIS — D469 Myelodysplastic syndrome, unspecified: Secondary | ICD-10-CM

## 2011-07-03 DIAGNOSIS — H409 Unspecified glaucoma: Secondary | ICD-10-CM

## 2011-07-03 DIAGNOSIS — E785 Hyperlipidemia, unspecified: Secondary | ICD-10-CM

## 2011-07-03 DIAGNOSIS — M810 Age-related osteoporosis without current pathological fracture: Secondary | ICD-10-CM

## 2011-07-03 DIAGNOSIS — I10 Essential (primary) hypertension: Secondary | ICD-10-CM

## 2011-07-03 DIAGNOSIS — I5020 Unspecified systolic (congestive) heart failure: Secondary | ICD-10-CM

## 2011-07-03 DIAGNOSIS — N189 Chronic kidney disease, unspecified: Secondary | ICD-10-CM

## 2011-07-03 LAB — PREPARE RBC (CROSSMATCH)

## 2011-07-03 LAB — CBC WITH DIFFERENTIAL/PLATELET
BASO%: 0.4 % (ref 0.0–2.0)
Eosinophils Absolute: 0 10*3/uL (ref 0.0–0.5)
LYMPH%: 59.5 % — ABNORMAL HIGH (ref 14.0–49.7)
MCHC: 34.3 g/dL (ref 31.5–36.0)
MONO#: 0 10*3/uL — ABNORMAL LOW (ref 0.1–0.9)
NEUT#: 0.7 10*3/uL — ABNORMAL LOW (ref 1.5–6.5)
Platelets: 11 10*3/uL — ABNORMAL LOW (ref 145–400)
RBC: 2.4 10*6/uL — ABNORMAL LOW (ref 3.70–5.45)
RDW: 24.3 % — ABNORMAL HIGH (ref 11.2–14.5)
WBC: 1.8 10*3/uL — ABNORMAL LOW (ref 3.9–10.3)

## 2011-07-03 LAB — HOLD TUBE, BLOOD BANK

## 2011-07-03 MED ORDER — DIPHENHYDRAMINE HCL 50 MG/ML IJ SOLN
25.0000 mg | Freq: Once | INTRAMUSCULAR | Status: AC
  Administered 2011-07-03: 25 mg via INTRAVENOUS

## 2011-07-03 MED ORDER — ACETAMINOPHEN 325 MG PO TABS
650.0000 mg | ORAL_TABLET | Freq: Once | ORAL | Status: AC
  Administered 2011-07-03: 650 mg via ORAL

## 2011-07-03 NOTE — Progress Notes (Signed)
1440-- BP 188/66, pt stated she forgot to take her BP meds, pt proceeded to take them.  Will continue to monitor.  SLJ

## 2011-07-03 NOTE — Progress Notes (Signed)
1625-- BP elevated to 186/77.  Pt asymptomatic, Informed Dr Clelia Croft, no further orders, pt is to let cardiologist know and to check BP at home. Pt verbalized understanding. SLJ

## 2011-07-04 LAB — TYPE AND SCREEN
ABO/RH(D): O NEG
Unit division: 0

## 2011-07-06 ENCOUNTER — Other Ambulatory Visit: Payer: Self-pay | Admitting: *Deleted

## 2011-07-14 ENCOUNTER — Other Ambulatory Visit: Payer: Self-pay | Admitting: Oncology

## 2011-07-14 DIAGNOSIS — H409 Unspecified glaucoma: Secondary | ICD-10-CM

## 2011-07-14 DIAGNOSIS — N189 Chronic kidney disease, unspecified: Secondary | ICD-10-CM

## 2011-07-14 DIAGNOSIS — M109 Gout, unspecified: Secondary | ICD-10-CM

## 2011-07-14 DIAGNOSIS — Z8719 Personal history of other diseases of the digestive system: Secondary | ICD-10-CM

## 2011-07-14 DIAGNOSIS — D469 Myelodysplastic syndrome, unspecified: Secondary | ICD-10-CM

## 2011-07-14 DIAGNOSIS — F341 Dysthymic disorder: Secondary | ICD-10-CM

## 2011-07-14 DIAGNOSIS — K219 Gastro-esophageal reflux disease without esophagitis: Secondary | ICD-10-CM

## 2011-07-14 DIAGNOSIS — K449 Diaphragmatic hernia without obstruction or gangrene: Secondary | ICD-10-CM

## 2011-07-14 DIAGNOSIS — M810 Age-related osteoporosis without current pathological fracture: Secondary | ICD-10-CM

## 2011-07-14 DIAGNOSIS — C649 Malignant neoplasm of unspecified kidney, except renal pelvis: Secondary | ICD-10-CM

## 2011-07-14 DIAGNOSIS — K5730 Diverticulosis of large intestine without perforation or abscess without bleeding: Secondary | ICD-10-CM

## 2011-07-14 DIAGNOSIS — Z860100 Personal history of colon polyps, unspecified: Secondary | ICD-10-CM

## 2011-07-14 DIAGNOSIS — E785 Hyperlipidemia, unspecified: Secondary | ICD-10-CM

## 2011-07-14 DIAGNOSIS — I6350 Cerebral infarction due to unspecified occlusion or stenosis of unspecified cerebral artery: Secondary | ICD-10-CM

## 2011-07-14 DIAGNOSIS — I5020 Unspecified systolic (congestive) heart failure: Secondary | ICD-10-CM

## 2011-07-14 DIAGNOSIS — I10 Essential (primary) hypertension: Secondary | ICD-10-CM

## 2011-07-17 ENCOUNTER — Other Ambulatory Visit: Payer: Self-pay | Admitting: *Deleted

## 2011-07-17 ENCOUNTER — Encounter (HOSPITAL_COMMUNITY): Payer: Medicare Other

## 2011-07-17 ENCOUNTER — Ambulatory Visit (HOSPITAL_BASED_OUTPATIENT_CLINIC_OR_DEPARTMENT_OTHER): Payer: Medicare Other

## 2011-07-17 ENCOUNTER — Other Ambulatory Visit: Payer: Self-pay | Admitting: Physician Assistant

## 2011-07-17 ENCOUNTER — Other Ambulatory Visit: Payer: Medicare Other

## 2011-07-17 VITALS — BP 177/78 | HR 66 | Temp 97.8°F | Resp 18

## 2011-07-17 DIAGNOSIS — D469 Myelodysplastic syndrome, unspecified: Secondary | ICD-10-CM

## 2011-07-17 DIAGNOSIS — D649 Anemia, unspecified: Secondary | ICD-10-CM

## 2011-07-17 DIAGNOSIS — C649 Malignant neoplasm of unspecified kidney, except renal pelvis: Secondary | ICD-10-CM

## 2011-07-17 LAB — CBC WITH DIFFERENTIAL/PLATELET
BASO%: 0.6 % (ref 0.0–2.0)
LYMPH%: 51.7 % — ABNORMAL HIGH (ref 14.0–49.7)
MCHC: 34.9 g/dL (ref 31.5–36.0)
MCV: 90.2 fL (ref 79.5–101.0)
MONO#: 0 10*3/uL — ABNORMAL LOW (ref 0.1–0.9)
MONO%: 1.3 % (ref 0.0–14.0)
Platelets: 7 10*3/uL — CL (ref 145–400)
RBC: 2.57 10*6/uL — ABNORMAL LOW (ref 3.70–5.45)
WBC: 1 10*3/uL — ABNORMAL LOW (ref 3.9–10.3)

## 2011-07-17 MED ORDER — DIPHENHYDRAMINE HCL 50 MG/ML IJ SOLN
25.0000 mg | Freq: Once | INTRAMUSCULAR | Status: AC
Start: 2011-07-17 — End: 2011-07-17
  Administered 2011-07-17: 25 mg via INTRAVENOUS

## 2011-07-17 MED ORDER — DIPHENHYDRAMINE HCL 25 MG PO CAPS
50.0000 mg | ORAL_CAPSULE | Freq: Once | ORAL | Status: AC
  Administered 2011-07-17: 50 mg via ORAL

## 2011-07-17 MED ORDER — ACETAMINOPHEN 325 MG PO TABS
650.0000 mg | ORAL_TABLET | Freq: Once | ORAL | Status: AC
  Administered 2011-07-17: 650 mg via ORAL

## 2011-07-17 MED ORDER — METHYLPREDNISOLONE SODIUM SUCC 40 MG IJ SOLR
40.0000 mg | Freq: Once | INTRAMUSCULAR | Status: AC
  Administered 2011-07-17: 40 mg via INTRAVENOUS

## 2011-07-17 NOTE — Progress Notes (Signed)
Patient began itching  during administration of platelets.  V/O received from Dr. Cyndie Chime for additional 50mg  PO Benadryl and 40 Solu-Medrol.  Given per [facility].  Platelets completed without any further complaints of itching.

## 2011-07-18 LAB — TYPE AND SCREEN
ABO/RH(D): O NEG
Unit division: 0

## 2011-07-18 LAB — PREPARE PLATELET PHERESIS: Unit division: 0

## 2011-07-23 NOTE — Telephone Encounter (Signed)
completed

## 2011-07-27 ENCOUNTER — Telehealth: Payer: Self-pay | Admitting: Family Medicine

## 2011-07-27 NOTE — Telephone Encounter (Signed)
Pt uses [facility] mail order. She would a refill on her Vicodin. Thanks.

## 2011-07-28 NOTE — Telephone Encounter (Signed)
#  180 tablets given in January.  Per Dr [name] refill too soon. Called pt, no answer, no machine

## 2011-07-30 MED ORDER — HYDROCODONE-ACETAMINOPHEN 2.5-500 MG PO TABS: 1.0000 | ORAL_TABLET | Freq: Three times a day (TID) | ORAL | Status: DC

## 2011-07-30 NOTE — Telephone Encounter (Signed)
Spoke with pt she states she has 8 pills left.  Spoke to [employer] and they gave her #135 with directions 1/2 tab po tid on 06/18/11.

## 2011-07-30 NOTE — Telephone Encounter (Signed)
Per Dr [name] change to hydrocodone 2.5/500 1 po tid #180.  rx faxed to [facility].  Pt aware of dosage change

## 2011-07-31 ENCOUNTER — Ambulatory Visit (HOSPITAL_BASED_OUTPATIENT_CLINIC_OR_DEPARTMENT_OTHER): Payer: Medicare Other

## 2011-07-31 ENCOUNTER — Other Ambulatory Visit: Payer: Self-pay | Admitting: Oncology

## 2011-07-31 ENCOUNTER — Other Ambulatory Visit: Payer: Medicare Other | Admitting: Lab

## 2011-07-31 ENCOUNTER — Telehealth: Payer: Self-pay | Admitting: Oncology

## 2011-07-31 ENCOUNTER — Other Ambulatory Visit: Payer: Self-pay | Admitting: *Deleted

## 2011-07-31 ENCOUNTER — Ambulatory Visit: Payer: Medicare Other | Admitting: Oncology

## 2011-07-31 ENCOUNTER — Other Ambulatory Visit: Payer: Self-pay | Admitting: Physician Assistant

## 2011-07-31 ENCOUNTER — Encounter (HOSPITAL_COMMUNITY): Admission: RE | Admit: 2011-07-31 | Payer: Medicare Other | Source: Ambulatory Visit

## 2011-07-31 VITALS — BP 164/74 | HR 66 | Temp 97.1°F | Ht 65.0 in | Wt 124.1 lb

## 2011-07-31 DIAGNOSIS — K449 Diaphragmatic hernia without obstruction or gangrene: Secondary | ICD-10-CM

## 2011-07-31 DIAGNOSIS — F341 Dysthymic disorder: Secondary | ICD-10-CM

## 2011-07-31 DIAGNOSIS — E785 Hyperlipidemia, unspecified: Secondary | ICD-10-CM

## 2011-07-31 DIAGNOSIS — Z8719 Personal history of other diseases of the digestive system: Secondary | ICD-10-CM

## 2011-07-31 DIAGNOSIS — M810 Age-related osteoporosis without current pathological fracture: Secondary | ICD-10-CM

## 2011-07-31 DIAGNOSIS — I10 Essential (primary) hypertension: Secondary | ICD-10-CM

## 2011-07-31 DIAGNOSIS — H409 Unspecified glaucoma: Secondary | ICD-10-CM

## 2011-07-31 DIAGNOSIS — Z860100 Personal history of colon polyps, unspecified: Secondary | ICD-10-CM

## 2011-07-31 DIAGNOSIS — D649 Anemia, unspecified: Secondary | ICD-10-CM | POA: Insufficient documentation

## 2011-07-31 DIAGNOSIS — I6350 Cerebral infarction due to unspecified occlusion or stenosis of unspecified cerebral artery: Secondary | ICD-10-CM

## 2011-07-31 DIAGNOSIS — N189 Chronic kidney disease, unspecified: Secondary | ICD-10-CM

## 2011-07-31 DIAGNOSIS — C649 Malignant neoplasm of unspecified kidney, except renal pelvis: Secondary | ICD-10-CM

## 2011-07-31 DIAGNOSIS — D696 Thrombocytopenia, unspecified: Secondary | ICD-10-CM

## 2011-07-31 DIAGNOSIS — K5730 Diverticulosis of large intestine without perforation or abscess without bleeding: Secondary | ICD-10-CM

## 2011-07-31 DIAGNOSIS — D469 Myelodysplastic syndrome, unspecified: Secondary | ICD-10-CM

## 2011-07-31 DIAGNOSIS — I5020 Unspecified systolic (congestive) heart failure: Secondary | ICD-10-CM

## 2011-07-31 DIAGNOSIS — K219 Gastro-esophageal reflux disease without esophagitis: Secondary | ICD-10-CM

## 2011-07-31 DIAGNOSIS — M109 Gout, unspecified: Secondary | ICD-10-CM

## 2011-07-31 LAB — CBC WITH DIFFERENTIAL/PLATELET
BASO%: 0 % (ref 0.0–2.0)
Basophils Absolute: 0 10*3/uL (ref 0.0–0.1)
EOS%: 7.1 % — ABNORMAL HIGH (ref 0.0–7.0)
HGB: 7.6 g/dL — ABNORMAL LOW (ref 11.6–15.9)
MCH: 29.1 pg (ref 25.1–34.0)
MCV: 88.5 fL (ref 79.5–101.0)
MONO%: 1 % (ref 0.0–14.0)
RBC: 2.61 10*6/uL — ABNORMAL LOW (ref 3.70–5.45)
RDW: 18 % — ABNORMAL HIGH (ref 11.2–14.5)
lymph#: 0.7 10*3/uL — ABNORMAL LOW (ref 0.9–3.3)
nRBC: 0 % (ref 0–0)

## 2011-07-31 LAB — PREPARE RBC (CROSSMATCH)

## 2011-07-31 MED ORDER — ACETAMINOPHEN 325 MG PO TABS
650.0000 mg | ORAL_TABLET | Freq: Once | ORAL | Status: AC
  Administered 2011-07-31: 650 mg via ORAL

## 2011-07-31 MED ORDER — DIPHENHYDRAMINE HCL 50 MG/ML IJ SOLN
25.0000 mg | Freq: Once | INTRAMUSCULAR | Status: AC
  Administered 2011-07-31: 11:00:00 via INTRAVENOUS

## 2011-07-31 NOTE — Telephone Encounter (Signed)
Gv pt appt for march-april2013 

## 2011-07-31 NOTE — Progress Notes (Signed)
Hematology and Oncology Follow Up Visit  YOMIRA FLITTON [id number] January 09, 1926 78 y.o. 07/31/2011 9:25 AM    CC  Bruce H. [name], MD  [name], M.D.    Principle Diagnosis: is an 78 year old female with the following issues:  1. Myelodysplastic syndrome, presented with pancytopenia, normal cytogenetics, diagnosed in 2010.  2. History of renal cell carcinoma, status post radiofrequency ablation.  Current therapy: Growth factor support with Procrit 40,000 units every 2 weeks to keep her hemoglobin above 11, as well as supportive packed red cell transfusion. She is really not a candidate for any aggressive therapy.   Interim History: Ms. Yontz presents today for a followup visit. She is an elderly frail woman with multiple comorbid conditions. She is not reporting any further fever, is not reporting any signs of any infectious process at this time. She is quite fatigued today and reports no SOB. She reported no bleeding at this time. No nausea or vomiting. Of note, patient experienced an episode of sigmoid pain related to her history of diverticular disease. Today, she is feeling better, avoiding any aggressive foods such as seeds, or milk products. Over the last few days, the patient has not eaten well with subsequent weight loss of about 3 pounds since her last visit. No recent hospitalization or infection at this time.   Medications:   Current Outpatient Prescriptions  Medication Sig Dispense Refill  . ALPRAZolam (XANAX) 0.5 MG tablet Take 1 tablet (0.5 mg total) by mouth at bedtime as needed.  90 tablet  0  . bisoprolol (ZEBETA) 10 MG tablet TAKE 1 TABLET ONCE DAILY  90 tablet  2  . famotidine (PEPCID) 40 MG tablet Take 1 tablet (40 mg total) by mouth daily.  90 tablet  3  . felodipine (PLENDIL) 10 MG 24 hr tablet Take 1 tablet (10 mg total) by mouth daily.  90 tablet  3  . folic acid (FOLVITE) 1 MG tablet Take 1 tablet (1 mg total) by mouth daily.  90 tablet  1  . hydrALAZINE  (APRESOLINE) 25 MG tablet Take 1 tablet (25 mg total) by mouth 3 (three) times daily.  270 tablet  1  . HYDROcodone-acetaminophen (VICODIN) 2.5-500 MG per tablet Take 1 tablet by mouth 3 (three) times daily.  180 tablet  0  . metroNIDAZOLE (FLAGYL) 500 MG tablet Take 1 tablet (500 mg total) by mouth every 8 (eight) hours.  270 tablet  3  . Probiotic Product (ALIGN) 4 MG CAPS Take by mouth daily.       Marland Kitchen ULORIC 40 MG tablet Take 1 tablet by mouth daily.        Allergies:  Allergies  Allergen Reactions  . Clonidine Hydrochloride     REACTION: Reaction per patient, can't take.  Also references hive on forehead \\T \ spts on chest from other meds  . Doxazosin Mesylate     REACTION: holding due to itching  . Doxycycline Hyclate     REACTION: rash  . Erythromycin Ethylsuccinate     REACTION: unspecified  . Meperidine Hcl     REACTION: unspecified  . Penicillins     REACTION: unspecified  . Sulfonamide Derivatives     REACTION: unspecified    Past Medical History, Surgical history, Social history, and Family History were reviewed and updated.  Review of Systems: Constitutional: Negative for fever, chills, night sweats, anorexia, positive for weight loss,Cardiovascular: no chest pain or dyspnea on exertion  Respiratory: no cough, shortness of breath, or wheezing  Neurological:  no TIA or stroke symptoms  Dermatological: negative  ENT: negative  Skin: Negative.  Gastrointestinal:  resolving left lower quadrant pain secondary to diverticular disease. ,  No change in bowel habits, or black or bloody stools  Genito-Urinary: no dysuria, trouble voiding, or hematuria. She had a recent UTI, treated with antibiotics by her nephrologist. Hematological and Lymphatic: negative  Breast: negative  Musculoskeletal: negative   Remaining ROS negative  Physical Exam:  Blood pressure 164/74, pulse 66, temperature 97.1 F (36.2 C), height 5\' 5"  (1.651 m), weight 124 lb 1.6 oz (56.291 kg). ECOG:    Lab Results:   Lab 07/31/11 0859  WBC 1.0*  HGB 7.6*  HCT 23.1*  PLT <6*  MCV 88.5  MCH 29.1  MCHC 32.9  RDW 18.0*  LYMPHSABS 0.7*  MONOABS 0.0*  EOSABS 0.1  BASOSABS 0.0  BANDABS --    CMP   No results found for this basename: NA:5,K:5,CL:5,CO2:5,GLUCOSE:5,BUN:5,CREATININE:5,GFRCGP,:5,CALCIUM:5,MG:5,AST:5,ALT:5,ALKPHOS:5,BILITOT:5 in the last 168 hours      Component Value Date/Time   BILITOT 0.3 06/19/2011 1013   BILIDIR 0.1 06/10/2010 1228    Radiological Studies:  No results found.   Impression and Plan:  This is an 78 year old woman with the following issues:    1. Myelodysplastic syndrome with pancytopenia. Ms. Burnstein is not really a candidate for any aggressive therapy such as Revlimid or 5-azacytidine. She has a very poor performance status and her preference is to do supportive measures only and which we will continue with packed red cell transfusions and growth factor support as needed. She will receive PRBC transfusion today given her symptomatic anemia. She will likely need Q 2 weeks transfusions.    2.Thrombocytopenia with platelet count of 10,000. We will transfuse only if she is having active bleeding.     3.Recurrent urinary tract infection. Improved at this time   Spent more than half the time coordinating care.    Marcos Eke, MD 3/1/20139:25 AM

## 2011-07-31 NOTE — Patient Instructions (Signed)
Blood Transfusion Information WHAT IS A BLOOD TRANSFUSION? A transfusion is the replacement of blood or some of its parts. Blood is made up of multiple cells which provide different functions.  Red blood cells carry oxygen and are used for blood loss replacement.   White blood cells fight against infection.   Platelets control bleeding.   Plasma helps clot blood.   Other blood products are available for specialized needs, such as hemophilia or other clotting disorders.  BEFORE THE TRANSFUSION  Who gives blood for transfusions?   You may be able to donate blood to be used at a later date on yourself (autologous donation).   Relatives can be asked to donate blood. This is generally not any safer than if you have received blood from a stranger. The same precautions are taken to ensure safety when a relative's blood is donated.   Healthy volunteers who are fully evaluated to make sure their blood is safe. This is blood bank blood.  Transfusion therapy is the safest it has ever been in the practice of medicine. Before blood is taken from a donor, a complete history is taken to make sure that person has no history of diseases nor engages in risky social behavior (examples are intravenous drug use or sexual activity with multiple partners). The donor's travel history is screened to minimize risk of transmitting infections, such as malaria. The donated blood is tested for signs of infectious diseases, such as HIV and hepatitis. The blood is then tested to be sure it is compatible with you in order to minimize the chance of a transfusion reaction. If you or a relative donates blood, this is often done in anticipation of surgery and is not appropriate for emergency situations. It takes many days to process the donated blood. RISKS AND COMPLICATIONS Although transfusion therapy is very safe and saves many lives, the main dangers of transfusion include:   Getting an infectious disease.   Developing a  transfusion reaction. This is an allergic reaction to something in the blood you were given. Every precaution is taken to prevent this.  The decision to have a blood transfusion has been considered carefully by your caregiver before blood is given. Blood is not given unless the benefits outweigh the risks. AFTER THE TRANSFUSION  Right after receiving a blood transfusion, you will usually feel much better and more energetic. This is especially true if your red blood cells have gotten low (anemic). The transfusion raises the level of the red blood cells which carry oxygen, and this usually causes an energy increase.   The nurse administering the transfusion will monitor you carefully for complications.  HOME CARE INSTRUCTIONS  No special instructions are needed after a transfusion. You may find your energy is better. Speak with your caregiver about any limitations on activity for underlying diseases you may have. SEEK MEDICAL CARE IF:   Your condition is not improving after your transfusion.   You develop redness or irritation at the intravenous (IV) site.  SEEK IMMEDIATE MEDICAL CARE IF:  Any of the following symptoms occur over the next 12 hours:  Shaking chills.   You have a temperature by mouth above 102 F (38.9 C), not controlled by medicine.   Chest, back, or muscle pain.   People around you feel you are not acting correctly or are confused.   Shortness of breath or difficulty breathing.   Dizziness and fainting.   You get a rash or develop hives.   You have a decrease   in urine output.   Your urine turns a dark color or changes to pink, red, or brown.  Any of the following symptoms occur over the next 10 days:  You have a temperature by mouth above 102 F (38.9 C), not controlled by medicine.   Shortness of breath.   Weakness after normal activity.   The white part of the eye turns yellow (jaundice).   You have a decrease in the amount of urine or are urinating less  often.   Your urine turns a dark color or changes to pink, red, or brown.  Document Released: 05/15/2000 Document Revised: 01/28/2011 Document Reviewed: 01/02/2008 [employer] Patient Information 2012 [employer]. 

## 2011-07-31 NOTE — Progress Notes (Signed)
Patient seen and examined. I agree with note above by Marlowe Kays PA. Patient is approaching end stage status unfortunately. I have discussed with her today the option of stopping transfusion and having hospice involved. She will think about that and let me know in the future.   Will transfuse platelet only of active bleeding.

## 2011-08-01 LAB — TYPE AND SCREEN
ABO/RH(D): O NEG
Unit division: 0

## 2011-08-02 ENCOUNTER — Emergency Department (HOSPITAL_COMMUNITY): Payer: Medicare Other

## 2011-08-02 ENCOUNTER — Inpatient Hospital Stay (HOSPITAL_COMMUNITY)
Admission: EM | Admit: 2011-08-02 | Discharge: 2011-08-07 | DRG: 312 | Disposition: A | Discharge status: Skilled Nursing Facility | Payer: Medicare Other | Attending: Internal Medicine | Admitting: Internal Medicine

## 2011-08-02 ENCOUNTER — Encounter (HOSPITAL_COMMUNITY): Payer: Self-pay

## 2011-08-02 ENCOUNTER — Other Ambulatory Visit: Payer: Self-pay

## 2011-08-02 DIAGNOSIS — Z66 Do not resuscitate: Secondary | ICD-10-CM | POA: Diagnosis not present

## 2011-08-02 DIAGNOSIS — S0003XA Contusion of scalp, initial encounter: Secondary | ICD-10-CM | POA: Diagnosis present

## 2011-08-02 DIAGNOSIS — I5022 Chronic systolic (congestive) heart failure: Secondary | ICD-10-CM | POA: Diagnosis present

## 2011-08-02 DIAGNOSIS — M1990 Unspecified osteoarthritis, unspecified site: Secondary | ICD-10-CM | POA: Diagnosis present

## 2011-08-02 DIAGNOSIS — I951 Orthostatic hypotension: Principal | ICD-10-CM | POA: Diagnosis present

## 2011-08-02 DIAGNOSIS — Z86010 Personal history of colonic polyps: Secondary | ICD-10-CM

## 2011-08-02 DIAGNOSIS — M810 Age-related osteoporosis without current pathological fracture: Secondary | ICD-10-CM | POA: Diagnosis present

## 2011-08-02 DIAGNOSIS — R55 Syncope and collapse: Secondary | ICD-10-CM

## 2011-08-02 DIAGNOSIS — N184 Chronic kidney disease, stage 4 (severe): Secondary | ICD-10-CM | POA: Diagnosis present

## 2011-08-02 DIAGNOSIS — I10 Essential (primary) hypertension: Secondary | ICD-10-CM | POA: Diagnosis present

## 2011-08-02 DIAGNOSIS — W19XXXA Unspecified fall, initial encounter: Secondary | ICD-10-CM | POA: Diagnosis present

## 2011-08-02 DIAGNOSIS — F341 Dysthymic disorder: Secondary | ICD-10-CM | POA: Diagnosis present

## 2011-08-02 DIAGNOSIS — Z8719 Personal history of other diseases of the digestive system: Secondary | ICD-10-CM

## 2011-08-02 DIAGNOSIS — N390 Urinary tract infection, site not specified: Secondary | ICD-10-CM | POA: Diagnosis present

## 2011-08-02 DIAGNOSIS — H409 Unspecified glaucoma: Secondary | ICD-10-CM | POA: Diagnosis present

## 2011-08-02 DIAGNOSIS — M67919 Unspecified disorder of synovium and tendon, unspecified shoulder: Secondary | ICD-10-CM | POA: Diagnosis present

## 2011-08-02 DIAGNOSIS — E785 Hyperlipidemia, unspecified: Secondary | ICD-10-CM

## 2011-08-02 DIAGNOSIS — I129 Hypertensive chronic kidney disease with stage 1 through stage 4 chronic kidney disease, or unspecified chronic kidney disease: Secondary | ICD-10-CM | POA: Diagnosis present

## 2011-08-02 DIAGNOSIS — I5020 Unspecified systolic (congestive) heart failure: Secondary | ICD-10-CM

## 2011-08-02 DIAGNOSIS — S0083XA Contusion of other part of head, initial encounter: Secondary | ICD-10-CM | POA: Diagnosis present

## 2011-08-02 DIAGNOSIS — Z85528 Personal history of other malignant neoplasm of kidney: Secondary | ICD-10-CM

## 2011-08-02 DIAGNOSIS — Z8673 Personal history of transient ischemic attack (TIA), and cerebral infarction without residual deficits: Secondary | ICD-10-CM

## 2011-08-02 DIAGNOSIS — B961 Klebsiella pneumoniae [K. pneumoniae] as the cause of diseases classified elsewhere: Secondary | ICD-10-CM | POA: Diagnosis present

## 2011-08-02 DIAGNOSIS — M109 Gout, unspecified: Secondary | ICD-10-CM | POA: Diagnosis present

## 2011-08-02 DIAGNOSIS — K449 Diaphragmatic hernia without obstruction or gangrene: Secondary | ICD-10-CM

## 2011-08-02 DIAGNOSIS — I7300 Raynaud's syndrome without gangrene: Secondary | ICD-10-CM | POA: Diagnosis present

## 2011-08-02 DIAGNOSIS — Z79899 Other long term (current) drug therapy: Secondary | ICD-10-CM

## 2011-08-02 DIAGNOSIS — R5381 Other malaise: Secondary | ICD-10-CM | POA: Diagnosis present

## 2011-08-02 DIAGNOSIS — D61818 Other pancytopenia: Secondary | ICD-10-CM | POA: Diagnosis present

## 2011-08-02 DIAGNOSIS — M719 Bursopathy, unspecified: Secondary | ICD-10-CM | POA: Diagnosis present

## 2011-08-02 DIAGNOSIS — I509 Heart failure, unspecified: Secondary | ICD-10-CM | POA: Diagnosis present

## 2011-08-02 DIAGNOSIS — K5730 Diverticulosis of large intestine without perforation or abscess without bleeding: Secondary | ICD-10-CM

## 2011-08-02 DIAGNOSIS — N189 Chronic kidney disease, unspecified: Secondary | ICD-10-CM | POA: Diagnosis present

## 2011-08-02 DIAGNOSIS — K219 Gastro-esophageal reflux disease without esophagitis: Secondary | ICD-10-CM | POA: Diagnosis present

## 2011-08-02 DIAGNOSIS — C649 Malignant neoplasm of unspecified kidney, except renal pelvis: Secondary | ICD-10-CM | POA: Diagnosis present

## 2011-08-02 DIAGNOSIS — I6350 Cerebral infarction due to unspecified occlusion or stenosis of unspecified cerebral artery: Secondary | ICD-10-CM

## 2011-08-02 DIAGNOSIS — R531 Weakness: Secondary | ICD-10-CM

## 2011-08-02 DIAGNOSIS — D469 Myelodysplastic syndrome, unspecified: Secondary | ICD-10-CM | POA: Diagnosis present

## 2011-08-02 HISTORY — DX: Unspecified rotator cuff tear or rupture of unspecified shoulder, not specified as traumatic: M75.100

## 2011-08-02 LAB — COMPREHENSIVE METABOLIC PANEL
ALT: 9 U/L (ref 0–35)
AST: 15 U/L (ref 0–37)
Albumin: 2.8 g/dL — ABNORMAL LOW (ref 3.5–5.2)
Alkaline Phosphatase: 134 U/L — ABNORMAL HIGH (ref 39–117)
BUN: 50 mg/dL — ABNORMAL HIGH (ref 6–23)
CO2: 22 mEq/L (ref 19–32)
Calcium: 9.8 mg/dL (ref 8.4–10.5)
Chloride: 110 mEq/L (ref 96–112)
Creatinine, Ser: 2.21 mg/dL — ABNORMAL HIGH (ref 0.50–1.10)
GFR calc Af Amer: 22 mL/min — ABNORMAL LOW (ref >90)
GFR calc non Af Amer: 19 mL/min — ABNORMAL LOW (ref >90)
Glucose, Bld: 112 mg/dL — ABNORMAL HIGH (ref 70–99)
Potassium: 4.5 mEq/L (ref 3.5–5.1)
Sodium: 142 mEq/L (ref 135–145)
Total Bilirubin: 0.4 mg/dL (ref 0.3–1.2)
Total Protein: 6 g/dL (ref 6.0–8.3)

## 2011-08-02 LAB — URINALYSIS, ROUTINE W REFLEX MICROSCOPIC
Bilirubin Urine: NEGATIVE
Glucose, UA: NEGATIVE mg/dL
Ketones, ur: NEGATIVE mg/dL
Nitrite: NEGATIVE
Protein, ur: >300 mg/dL — AB
Specific Gravity, Urine: 1.014 (ref 1.005–1.030)
Urobilinogen, UA: 0.2 mg/dL (ref 0.0–1.0)
pH: 6.5 (ref 5.0–8.0)

## 2011-08-02 LAB — CBC
HCT: 31 % — ABNORMAL LOW (ref 36.0–46.0)
Hemoglobin: 10.7 g/dL — ABNORMAL LOW (ref 12.0–15.0)
MCH: 30 pg (ref 26.0–34.0)
MCHC: 34.5 g/dL (ref 30.0–36.0)
MCV: 86.8 fL (ref 78.0–100.0)
Platelets: <30 10*3/uL — CL (ref 150–400)
RBC: 3.57 MIL/uL — ABNORMAL LOW (ref 3.87–5.11)
RDW: 16.7 % — ABNORMAL HIGH (ref 11.5–15.5)
WBC: 1 10*3/uL — CL (ref 4.0–10.5)

## 2011-08-02 LAB — URINE MICROSCOPIC-ADD ON

## 2011-08-02 LAB — DIFFERENTIAL
Basophils Absolute: 0 10*3/uL (ref 0.0–0.1)
Basophils Relative: 0 % (ref 0–1)
Eosinophils Absolute: 0.2 10*3/uL (ref 0.0–0.7)
Eosinophils Relative: 17 % — ABNORMAL HIGH (ref 0–5)
Lymphocytes Relative: 42 % (ref 12–46)
Lymphs Abs: 0.4 10*3/uL — ABNORMAL LOW (ref 0.7–4.0)
Monocytes Absolute: 0 10*3/uL — ABNORMAL LOW (ref 0.1–1.0)
Monocytes Relative: 2 % — ABNORMAL LOW (ref 3–12)
Neutro Abs: 0.4 10*3/uL — ABNORMAL LOW (ref 1.7–7.7)
Neutrophils Relative %: 39 % — ABNORMAL LOW (ref 43–77)

## 2011-08-02 LAB — CARDIAC PANEL(CRET KIN+CKTOT+MB+TROPI)
CK, MB: 3.6 ng/mL (ref 0.3–4.0)
Relative Index: 2.6 — ABNORMAL HIGH (ref 0.0–2.5)
Total CK: 137 U/L (ref 7–177)
Troponin I: <0.3 ng/mL (ref <0.30)

## 2011-08-02 LAB — PROTIME-INR: Prothrombin Time: 13.3 seconds (ref 11.6–15.2)

## 2011-08-02 MED ORDER — DEXTROSE 5 % IV SOLN
1.0000 g | Freq: Once | INTRAVENOUS | Status: AC
  Administered 2011-08-02: 1 g via INTRAVENOUS
  Filled 2011-08-02: qty 10

## 2011-08-02 MED ORDER — LORAZEPAM 2 MG/ML IJ SOLN
0.5000 mg | Freq: Once | INTRAMUSCULAR | Status: AC
  Administered 2011-08-02: 0.5 mg via INTRAVENOUS
  Filled 2011-08-02: qty 1

## 2011-08-02 MED ORDER — SODIUM CHLORIDE 0.9 % IV BOLUS (SEPSIS)
1000.0000 mL | Freq: Once | INTRAVENOUS | Status: AC
  Administered 2011-08-02: 1000 mL via INTRAVENOUS

## 2011-08-02 MED ORDER — HYDRALAZINE HCL 25 MG PO TABS
25.0000 mg | ORAL_TABLET | ORAL | Status: AC
  Administered 2011-08-02: 25 mg via ORAL
  Filled 2011-08-02: qty 1

## 2011-08-02 NOTE — ED Notes (Signed)
Pt was able to able to ambulate with assistants pt has a walker at home she use

## 2011-08-02 NOTE — ED Notes (Signed)
Call [name] @ [phone].

## 2011-08-02 NOTE — ED Notes (Signed)
Pt lives alone.  She uses a walker to ambulate in the house.  At some point this morning pt got out of bed to use restroom.  Son called before church to check on her and there was no answer.  Son called again 30 min later and still no answer so left church and found mother on floor between bed and dresser.  Pt does not remember falling, unsure of amount of time down.  Pt denies pain.  Pt has hx of bone marrow disorder in which she receives 2 unit transfusion every 2 wks.  Son reports pt has had colitis and UTI's which have been very difficult to resolve.  Son reports that when he found pt she was able to move and ambulated with assistance and walker to her bed.  Also ambulated to living room, but son decided she needed to be checked out.

## 2011-08-02 NOTE — ED Notes (Signed)
Pt returned to exam room. Vital signs stable. Remains on cardiac monitor. She is alert and oriented x4. No signs of acute distress.

## 2011-08-02 NOTE — ED Notes (Signed)
Pt wants to go home as soon as the antibiotics are finished running in.  Pt states she cannot sleep or rest in hospital and her transportation is here now.

## 2011-08-02 NOTE — ED Notes (Addendum)
Pt seems much more relaxed and calm after 0.65ml ativan.  Finished one cup of contrast fluid. Assisted to bedside commode

## 2011-08-02 NOTE — ED Notes (Signed)
Wbc 1.0, platelets < 30, checked on smear, values called from lab. Dr Manus Gunning informed

## 2011-08-03 ENCOUNTER — Encounter (HOSPITAL_COMMUNITY): Payer: Self-pay | Admitting: Internal Medicine

## 2011-08-03 ENCOUNTER — Emergency Department (HOSPITAL_COMMUNITY): Payer: Medicare Other

## 2011-08-03 DIAGNOSIS — R531 Weakness: Secondary | ICD-10-CM | POA: Diagnosis present

## 2011-08-03 DIAGNOSIS — W19XXXA Unspecified fall, initial encounter: Secondary | ICD-10-CM | POA: Diagnosis present

## 2011-08-03 DIAGNOSIS — D469 Myelodysplastic syndrome, unspecified: Secondary | ICD-10-CM

## 2011-08-03 DIAGNOSIS — E785 Hyperlipidemia, unspecified: Secondary | ICD-10-CM

## 2011-08-03 DIAGNOSIS — R55 Syncope and collapse: Secondary | ICD-10-CM | POA: Diagnosis present

## 2011-08-03 LAB — DIFFERENTIAL
Basophils Relative: 0 % (ref 0–1)
Eosinophils Relative: 0 % (ref 0–5)
Lymphs Abs: 0 10*3/uL — ABNORMAL LOW (ref 0.7–4.0)
nRBC: 0 /100 WBC

## 2011-08-03 LAB — COMPREHENSIVE METABOLIC PANEL
AST: 13 U/L (ref 0–37)
Albumin: 2.2 g/dL — ABNORMAL LOW (ref 3.5–5.2)
Calcium: 8.9 mg/dL (ref 8.4–10.5)
Chloride: 111 mEq/L (ref 96–112)
Creatinine, Ser: 1.94 mg/dL — ABNORMAL HIGH (ref 0.50–1.10)
Sodium: 139 mEq/L (ref 135–145)
Total Bilirubin: 0.3 mg/dL (ref 0.3–1.2)

## 2011-08-03 LAB — CBC
Hemoglobin: 8.2 g/dL — ABNORMAL LOW (ref 12.0–15.0)
MCH: 29.8 pg (ref 26.0–34.0)
MCV: 86.5 fL (ref 78.0–100.0)
RBC: 2.75 MIL/uL — ABNORMAL LOW (ref 3.87–5.11)

## 2011-08-03 MED ORDER — DEXTROSE 5 % IV SOLN
1.0000 g | INTRAVENOUS | Status: DC
  Administered 2011-08-03 – 2011-08-04 (×2): 1 g via INTRAVENOUS
  Filled 2011-08-03 (×3): qty 10

## 2011-08-03 MED ORDER — ONDANSETRON HCL 4 MG PO TABS: 4.0000 mg | ORAL_TABLET | Freq: Four times a day (QID) | ORAL | Status: DC | PRN

## 2011-08-03 MED ORDER — ALPRAZOLAM 0.5 MG PO TABS
0.5000 mg | ORAL_TABLET | Freq: Every evening | ORAL | Status: DC | PRN
  Administered 2011-08-05: 0.5 mg via ORAL
  Filled 2011-08-03: qty 1

## 2011-08-03 MED ORDER — FEBUXOSTAT 40 MG PO TABS
40.0000 mg | ORAL_TABLET | Freq: Every day | ORAL | Status: DC
  Administered 2011-08-03 – 2011-08-07 (×5): 40 mg via ORAL
  Filled 2011-08-03 (×5): qty 1

## 2011-08-03 MED ORDER — HYDRALAZINE HCL 10 MG PO TABS
10.0000 mg | ORAL_TABLET | Freq: Three times a day (TID) | ORAL | Status: DC
  Administered 2011-08-03 – 2011-08-05 (×6): 10 mg via ORAL
  Filled 2011-08-03 (×9): qty 1

## 2011-08-03 MED ORDER — ONDANSETRON HCL 4 MG/2ML IJ SOLN: 4.0000 mg | Freq: Four times a day (QID) | INTRAMUSCULAR | Status: DC | PRN

## 2011-08-03 MED ORDER — ACETAMINOPHEN 325 MG PO TABS
650.0000 mg | ORAL_TABLET | Freq: Four times a day (QID) | ORAL | Status: DC | PRN
  Administered 2011-08-05: 650 mg via ORAL
  Filled 2011-08-03: qty 2

## 2011-08-03 MED ORDER — VITAMIN C 500 MG PO TABS
1000.0000 mg | ORAL_TABLET | Freq: Every day | ORAL | Status: DC
  Administered 2011-08-03 – 2011-08-07 (×5): 1000 mg via ORAL
  Filled 2011-08-03 (×5): qty 2

## 2011-08-03 MED ORDER — HYDROCODONE-ACETAMINOPHEN 5-325 MG PO TABS
1.0000 | ORAL_TABLET | Freq: Four times a day (QID) | ORAL | Status: DC | PRN
  Administered 2011-08-03 – 2011-08-07 (×5): 1 via ORAL
  Filled 2011-08-03 (×5): qty 1

## 2011-08-03 MED ORDER — FAMOTIDINE 40 MG PO TABS
40.0000 mg | ORAL_TABLET | Freq: Every day | ORAL | Status: DC
  Administered 2011-08-03 – 2011-08-07 (×5): 40 mg via ORAL
  Filled 2011-08-03 (×5): qty 1

## 2011-08-03 MED ORDER — FELODIPINE ER 10 MG PO TB24
10.0000 mg | ORAL_TABLET | Freq: Every day | ORAL | Status: DC
  Administered 2011-08-03 – 2011-08-07 (×5): 10 mg via ORAL
  Filled 2011-08-03 (×5): qty 1

## 2011-08-03 MED ORDER — SODIUM CHLORIDE 0.9 % IJ SOLN
3.0000 mL | Freq: Two times a day (BID) | INTRAMUSCULAR | Status: DC
  Administered 2011-08-03 – 2011-08-07 (×4): 3 mL via INTRAVENOUS

## 2011-08-03 MED ORDER — HYDRALAZINE HCL 25 MG PO TABS
25.0000 mg | ORAL_TABLET | Freq: Three times a day (TID) | ORAL | Status: DC
  Administered 2011-08-03: 25 mg via ORAL
  Filled 2011-08-03 (×3): qty 1

## 2011-08-03 MED ORDER — FOLIC ACID 1 MG PO TABS
1.0000 mg | ORAL_TABLET | Freq: Every day | ORAL | Status: DC
  Administered 2011-08-03 – 2011-08-07 (×5): 1 mg via ORAL
  Filled 2011-08-03 (×5): qty 1

## 2011-08-03 MED ORDER — SODIUM CHLORIDE 0.9 % IV SOLN
INTRAVENOUS | Status: DC
  Administered 2011-08-03 – 2011-08-04 (×3): via INTRAVENOUS

## 2011-08-03 MED ORDER — ACETAMINOPHEN 650 MG RE SUPP: 650.0000 mg | Freq: Four times a day (QID) | RECTAL | Status: DC | PRN

## 2011-08-03 MED ORDER — METHENAMINE MANDELATE 1 G PO TABS
1000.0000 mg | ORAL_TABLET | Freq: Two times a day (BID) | ORAL | Status: DC
  Administered 2011-08-03 – 2011-08-07 (×10): 1000 mg via ORAL
  Filled 2011-08-03 (×13): qty 1

## 2011-08-03 MED ORDER — BISOPROLOL FUMARATE 10 MG PO TABS
10.0000 mg | ORAL_TABLET | Freq: Every day | ORAL | Status: DC
  Administered 2011-08-03 – 2011-08-07 (×5): 10 mg via ORAL
  Filled 2011-08-03 (×5): qty 1

## 2011-08-03 NOTE — Evaluation (Signed)
Physical Therapy Evaluation Patient Details Name: [name] MRN: [id number] DOB: July 02, 1925 Today's Date: 08/03/2011  Problem List:  Patient Active Problem List  Diagnoses  . MYELODYSPLASTIC SYNDROME  . HYPERLIPIDEMIA  . Gout, unspecified  . ANXIETY DEPRESSION  . GLAUCOMA NOS  . HYPERTENSION  . CONGESTIVE HEART FAILURE, SYSTOLIC DYSFUNCTION  . CVA  . GERD  . HIATAL HERNIA  . DIVERTICULOSIS OF COLON  . KIDNEY DISEASE, CHRONIC NOS  . OSTEOPOROSIS  . PERSONAL HX COLONIC POLYPS  . COLITIS, HX OF  . RENAL CELL CANCER  . Near syncope  . Fall  . Weakness    Past Medical History:  Past Medical History  Diagnosis Date  . Acquired pancytopenia   . Hypertension   . Depression   . Osteoporosis   . HLD (hyperlipidemia)   . Fracture, hip     bilateral  . CVA (cerebral infarction)   . Chronic kidney disease   . Renal cell carcinoma   . Stricture esophagus   . Glaucoma   . CHF (congestive heart failure)   . Systolic dysfunction   . Raynaud disease   . Hyperhomocysteinemia   . Lymphocytic gastritis   . Anxiety   . DJD (degenerative joint disease)   . Gastric ulcer   . Myelodysplastic syndrome   . Hx of colonic polyps   . Myelodysplastic syndrome   . C. difficile colitis   . Diverticulosis   . Gout   . GERD (gastroesophageal reflux disease)   . Anxiety and depression   . Anemia   . Torn rotator cuff     right shoulder   Past Surgical History:  Past Surgical History  Procedure Date  . Abdominal hysterectomy   . Cataract extraction   . Varicose vein surgery   . Tubal ligation   . Hip fracture surgery      bilateral  . Spine surgery   . Kidney surgery     for renal carcinoma    PT Assessment/Plan/Recommendation PT Assessment Clinical Impression Statement: 78 y.o. femle admitted to [facility] for fall and syncopal episode.  She presents today with positive orthostatic testing, decreased leg strength, decreased balance, and decreased acitivity tolerance.  She would  benefit from skilled acute PT to maximize her independence, functional mobility and safety so that after extensive rehab she may be able to return home to a modified independent environment safely.   PT Recommendation/Assessment: Patient will need skilled PT in the acute care venue PT Problem List: Decreased strength;Decreased activity tolerance;Decreased balance;Decreased mobility;Decreased knowledge of use of DME;Pain Barriers to Discharge: Decreased caregiver support Barriers to Discharge Comments: The patient does not want to go to SNF, but doesn't want to incovience her son to have him stay with her for 24 hours (or there is questionable availability of her son to do this).  PT Therapy Diagnosis : Difficulty walking;Abnormality of gait;Generalized weakness;Acute pain PT Plan PT Frequency: Min 3X/week PT Treatment/Interventions: DME instruction;Gait training;Stair training;Functional mobility training;Therapeutic exercise;Therapeutic activities;Neuromuscular re-education;Balance training;Patient/family education PT Recommendation Follow Up Recommendations: Home health PT;Supervision/Assistance - 24 hour;Skilled nursing facility (if she doesn not have 24 hour assist, SNF is safest d/c) Equipment Recommended: None recommended by PT PT Goals  Acute Rehab PT Goals PT Goal Formulation: With patient Time For Goal Achievement: 2 weeks Pt will go Sit to Stand: with modified independence;with upper extremity assist PT Goal: Sit to Stand - Progress: Goal set today Pt will go Stand to Sit: with modified independence;with upper extremity assist PT Goal: Stand  to Sit - Progress: Goal set today Pt will Transfer Bed to Chair/Chair to Bed: with modified independence PT Transfer Goal: Bed to Chair/Chair to Bed - Progress: Goal set today Pt will Ambulate: >150 feet;with modified independence;with rolling walker PT Goal: Ambulate - Progress: Goal set today Pt will Go Up / Down Stairs: 1-2 stairs;with  modified independence;with least restrictive assistive device PT Goal: Up/Down Stairs - Progress: Goal set today  PT Evaluation Precautions/Restrictions  Precautions Precautions: Fall Prior Functioning  Home Living Lives With: Alone Receives Help From: Family;Friend(s) ("adopted" son and church members) Type of Home: House Home Layout: One level Home Access: Stairs to enter Home Adaptive Equipment: Walker - rolling Prior Function Level of Independence: Independent with basic ADLs;Independent with transfers;Requires assistive device for independence Able to Take Stairs?: Yes Driving: No Comments: someone helps with household chores and yardwork, comes about 2 xs/wk from the church Extremity Assessment  RLE Strength RLE Overall Strength: Deficits RLE Overall Strength Comments: grossly 3/5 per functional assessment LLE Strength LLE Overall Strength: Deficits LLE Overall Strength Comments: grossly 3/5 per functional assessment.   Mobility (including Balance) Bed Mobility Bed Mobility: Yes Rolling Right: 6: Modified independent (Device/Increase time);With rail Supine to Sit: 6: Modified independent (Device/Increase time);With rails;HOB elevated (Comment degrees) (HOB 30 degrees) Sitting - Scoot to Edge of Bed: 6: Modified independent (Device/Increase time) Sit to Supine: 6: Modified independent (Device/Increase time);With rail;HOB flat Scooting to [facility]: With rail;6: Modified independent (Device/Increase time) (bridging) Transfers Transfers: Yes Sit to Stand: 4: Min assist;From bed;From toilet;With upper extremity assist;With armrests Sit to Stand Details (indicate cue type and reason): min assist to steady patient for balance her tendancy is to lean posteriorly Stand to Sit: 4: Min assist;With upper extremity assist;To bed;To toilet;With armrests Stand to Sit Details: min assist to help control descent to sit.  Otherwise, the patient crashes to sit with uncontrolled  "plop" Ambulation/Gait Ambulation/Gait: Yes Ambulation/Gait Assistance: 4: Min assist Ambulation/Gait Assistance Details (indicate cue type and reason): min assist with RW to help patient keep her balance, max verbal cues for safe use of RW.   Ambulation Distance (Feet): 30 Feet (15' x 2 ) Assistive device: Rolling walker Gait Pattern: Shuffle (posterior lean while walking)   End of Session PT - End of Session Equipment Utilized During Treatment: Gait belt (RW) Activity Tolerance: Patient limited by fatigue Patient left: in bed;with call bell in reach;with bed alarm set Nurse Communication: Mobility status for transfers;Mobility status for ambulation (orthostatic BPs) General Behavior During Session: [facility] for tasks performed Cognition: [facility] for tasks performed  Janely Gullickson B. Immaculate Crutcher, PT, DPT [phone] 08/03/2011, 5:18 PM

## 2011-08-03 NOTE — Progress Notes (Signed)
Utilization Review Completed.  Doyle Kunath T  08/03/2011  

## 2011-08-03 NOTE — H&P (Addendum)
[name] is an 78 y.o. female.   PCP - Dr.Bruce Swords. Oncologist - Dr.Shadad. History obtained from ER physician and patient and old records. Chief Complaint: Found on the floor. HPI: 78 year-old female with history of myelodysplastic syndrome, chronic kidney disease, hypertension, renal cell carcinoma status post ablation therapy who has had a recent transfusion because of low hemoglobin on March 1 was brought to the ER because patient had a fall at her home and was found on the floor by her friend who brought her to the ER. Patient states she was trying to go to the bathroom when she fell and does not recall what made her fall but states she did not loose consciousness. In addition patient has been experiencing some abdominal pain on the suprapubic area last 3-4 days with no nausea vomiting or diarrhea. In the ER patient had a CT head maxillofacial area C-spine abdomen and pelvis all of which does not show any acute. Patient will be admitted for further observation. Patient lives alone. Patient at this time denies any chest pain shortness of breath nausea vomiting fever or chills.   Past Medical History  Diagnosis Date  . Acquired pancytopenia   . Hypertension   . Depression   . Osteoporosis   . HLD (hyperlipidemia)   . Fracture, hip     bilateral  . CVA (cerebral infarction)   . Chronic kidney disease   . Renal cell carcinoma   . Stricture esophagus   . Glaucoma   . CHF (congestive heart failure)   . Systolic dysfunction   . Raynaud disease   . Hyperhomocysteinemia   . Lymphocytic gastritis   . Anxiety   . DJD (degenerative joint disease)   . Gastric ulcer   . Myelodysplastic syndrome   . Hx of colonic polyps   . Myelodysplastic syndrome   . C. difficile colitis   . Diverticulosis   . Gout   . GERD (gastroesophageal reflux disease)   . Anxiety and depression   . Anemia   . Torn rotator cuff     right shoulder    Past Surgical History  Procedure Date  . Abdominal  hysterectomy   . Cataract extraction   . Varicose vein surgery   . Tubal ligation   . Hip fracture surgery      bilateral  . Spine surgery   . Kidney surgery     for renal carcinoma    Family History  Problem Relation Age of Onset  . Breast cancer Sister   . Diabetes Brother   . Colon cancer Neg Hx    Social History:  reports that she has never smoked. She has never used smokeless tobacco. She reports that she does not drink alcohol or use illicit drugs.  Allergies:  Allergies  Allergen Reactions  . Clonidine Hydrochloride     REACTION: Reaction per patient, can't take.  Also references hive on forehead \\T \ spts on chest from other meds  . Doxazosin Mesylate     REACTION: holding due to itching  . Doxycycline Hyclate     REACTION: rash  . Erythromycin Ethylsuccinate     REACTION: unspecified  . Meperidine Hcl     REACTION: unspecified  . Penicillins     REACTION: unspecified  . Sulfonamide Derivatives     REACTION: unspecified    Medications Prior to Admission  Medication Dose Route Frequency Provider Last Rate Last Dose  . 0.9 %  sodium chloride infusion   Intravenous Continuous  Eduard Clos, MD      . acetaminophen (TYLENOL) tablet 650 mg  650 mg Oral Q6H PRN Eduard Clos, MD       Or  . acetaminophen (TYLENOL) suppository 650 mg  650 mg Rectal Q6H PRN Eduard Clos, MD      . ALPRAZolam Prudy Feeler) tablet 0.5 mg  0.5 mg Oral QHS PRN Eduard Clos, MD      . bisoprolol (ZEBETA) tablet 10 mg  10 mg Oral Daily Eduard Clos, MD      . cefTRIAXone (ROCEPHIN) 1 g in dextrose 5 % 50 mL IVPB  1 g Intravenous Once Glynn Octave, MD   1 g at 08/02/11 2137  . cefTRIAXone (ROCEPHIN) 1 g in dextrose 5 % 50 mL IVPB  1 g Intravenous Q24H Eduard Clos, MD      . famotidine (PEPCID) tablet 40 mg  40 mg Oral Daily Eduard Clos, MD      . febuxostat (ULORIC) tablet 40 mg  40 mg Oral Daily Eduard Clos, MD      . felodipine  (PLENDIL) 24 hr tablet 10 mg  10 mg Oral Daily Eduard Clos, MD      . folic acid (FOLVITE) tablet 1 mg  1 mg Oral Daily Eduard Clos, MD      . hydrALAZINE (APRESOLINE) tablet 25 mg  25 mg Oral To Major Glynn Octave, MD   25 mg at 08/02/11 2246  . hydrALAZINE (APRESOLINE) tablet 25 mg  25 mg Oral TID Eduard Clos, MD      . HYDROcodone-acetaminophen (NORCO) 5-325 MG per tablet 1 tablet  1 tablet Oral Q6H PRN Eduard Clos, MD      . LORazepam (ATIVAN) injection 0.5 mg  0.5 mg Intravenous Once Glynn Octave, MD   0.5 mg at 08/02/11 2249  . methenamine (MANDELAMINE) tablet 1,000 mg  1,000 mg Oral BID Eduard Clos, MD      . ondansetron [facility]) tablet 4 mg  4 mg Oral Q6H PRN Eduard Clos, MD       Or  . ondansetron [facility]) injection 4 mg  4 mg Intravenous Q6H PRN Eduard Clos, MD      . sodium chloride 0.9 % bolus 1,000 mL  1,000 mL Intravenous Once Jamesetta Orleans Lawyer, PA-C   1,000 mL at 08/02/11 1818  . sodium chloride 0.9 % injection 3 mL  3 mL Intravenous Q12H Eduard Clos, MD      . vitamin C (ASCORBIC ACID) tablet 1,000 mg  1,000 mg Oral Daily Eduard Clos, MD       Medications Prior to Admission  Medication Sig Dispense Refill  . ALPRAZolam (XANAX) 0.5 MG tablet Take 0.5 mg by mouth at bedtime as needed. For sleep      . famotidine (PEPCID) 40 MG tablet Take 1 tablet (40 mg total) by mouth daily.  90 tablet  3  . felodipine (PLENDIL) 10 MG 24 hr tablet Take 1 tablet (10 mg total) by mouth daily.  90 tablet  3  . folic acid (FOLVITE) 1 MG tablet Take 1 tablet (1 mg total) by mouth daily.  90 tablet  1  . hydrALAZINE (APRESOLINE) 25 MG tablet Take 1 tablet (25 mg total) by mouth 3 (three) times daily.  270 tablet  1  . methenamine (MANDELAMINE) 1 GM tablet Take 1,000 mg by mouth Twice daily.      Marland Kitchen ULORIC 40  MG tablet Take 1 tablet by mouth daily.        Results for orders placed during the hospital encounter  of 08/02/11 (from the past 48 hour(s))  CARDIAC PANEL(CRET KIN+CKTOT+MB+TROPI)     Status: Abnormal   Collection Time   08/02/11  6:03 PM      Component Value Range Comment   Total CK 137  7 - 177 (U/L)    CK, MB 3.6  0.3 - 4.0 (ng/mL)    Troponin I <0.30  <0.30 (ng/mL)    Relative Index 2.6 (*) 0.0 - 2.5    CBC     Status: Abnormal   Collection Time   08/02/11  6:03 PM      Component Value Range Comment   WBC 1.0 (*) 4.0 - 10.5 (K/uL)    RBC 3.57 (*) 3.87 - 5.11 (MIL/uL)    Hemoglobin 10.7 (*) 12.0 - 15.0 (g/dL)    HCT 78.2 (*) 95.6 - 46.0 (%)    MCV 86.8  78.0 - 100.0 (fL)    MCH 30.0  26.0 - 34.0 (pg)    MCHC 34.5  30.0 - 36.0 (g/dL)    RDW 21.3 (*) 08.6 - 15.5 (%)    Platelets <30 (*) 150 - 400 (K/uL)   DIFFERENTIAL     Status: Abnormal   Collection Time   08/02/11  6:03 PM      Component Value Range Comment   Neutrophils Relative 39 (*) 43 - 77 (%)    Lymphocytes Relative 42  12 - 46 (%)    Monocytes Relative 2 (*) 3 - 12 (%)    Eosinophils Relative 17 (*) 0 - 5 (%)    Basophils Relative 0  0 - 1 (%)    Neutro Abs 0.4 (*) 1.7 - 7.7 (K/uL)    Lymphs Abs 0.4 (*) 0.7 - 4.0 (K/uL)    Monocytes Absolute 0.0 (*) 0.1 - 1.0 (K/uL)    Eosinophils Absolute 0.2  0.0 - 0.7 (K/uL)    Basophils Absolute 0.0  0.0 - 0.1 (K/uL)    RBC Morphology POLYCHROMASIA PRESENT      WBC Morphology ATYPICAL LYMPHOCYTES     COMPREHENSIVE METABOLIC PANEL     Status: Abnormal   Collection Time   08/02/11  6:03 PM      Component Value Range Comment   Sodium 142  135 - 145 (mEq/L)    Potassium 4.5  3.5 - 5.1 (mEq/L)    Chloride 110  96 - 112 (mEq/L)    CO2 22  19 - 32 (mEq/L)    Glucose, Bld 112 (*) 70 - 99 (mg/dL)    BUN 50 (*) 6 - 23 (mg/dL)    Creatinine, Ser 5.78 (*) 0.50 - 1.10 (mg/dL)    Calcium 9.8  8.4 - 10.5 (mg/dL)    Total Protein 6.0  6.0 - 8.3 (g/dL)    Albumin 2.8 (*) 3.5 - 5.2 (g/dL)    AST 15  0 - 37 (U/L)    ALT 9  0 - 35 (U/L)    Alkaline Phosphatase 134 (*) 39 - 117 (U/L)     Total Bilirubin 0.4  0.3 - 1.2 (mg/dL)    GFR calc non Af Amer 19 (*) >90 (mL/min)    GFR calc Af Amer 22 (*) >90 (mL/min)   PROTIME-INR     Status: Normal   Collection Time   08/02/11  6:03 PM      Component Value Range Comment  Prothrombin Time 13.3  11.6 - 15.2 (seconds)    INR 0.99  0.00 - 1.49    URINALYSIS, ROUTINE W REFLEX MICROSCOPIC     Status: Abnormal   Collection Time   08/02/11  8:50 PM      Component Value Range Comment   Color, Urine YELLOW  YELLOW     APPearance CLOUDY (*) CLEAR     Specific Gravity, Urine 1.014  1.005 - 1.030     pH 6.5  5.0 - 8.0     Glucose, UA NEGATIVE  NEGATIVE (mg/dL)    Hgb urine dipstick MODERATE (*) NEGATIVE     Bilirubin Urine NEGATIVE  NEGATIVE     Ketones, ur NEGATIVE  NEGATIVE (mg/dL)    Protein, ur >161 (*) NEGATIVE (mg/dL)    Urobilinogen, UA 0.2  0.0 - 1.0 (mg/dL)    Nitrite NEGATIVE  NEGATIVE     Leukocytes, UA TRACE (*) NEGATIVE    URINE MICROSCOPIC-ADD ON     Status: Abnormal   Collection Time   08/02/11  8:50 PM      Component Value Range Comment   Squamous Epithelial / LPF FEW (*) RARE     WBC, UA 21-50  <3 (WBC/hpf)    RBC / HPF 7-10  <3 (RBC/hpf)    Bacteria, UA MANY (*) RARE     Casts HYALINE CASTS (*) NEGATIVE     Urine-Other WBC CLUMPS      Ct Abdomen Pelvis Wo Contrast  08/03/2011  *RADIOLOGY REPORT*  Clinical Data:  Weakness, dizziness, fall, abdominal pain, nausea, vomiting, past history hypertension, renal cell carcinoma post surgery, CHF, myelodysplastic syndrome  CT ABDOMEN AND PELVIS WITHOUT CONTRAST  Technique:  Multidetector CT imaging of the abdomen and pelvis was performed following the standard protocol without intravenous contrast. Sagittal and coronal MPR images reconstructed from axial data set.  Comparison: 03/11/2011  Findings: Low attenuation of circulating blood consistent with anemia. Minimal bibasilar atelectasis. Extensive atherosclerotic calcification including dense bilateral renal artery calcification.  Radiodense liver with small hepatic cysts. Herniation on the right kidney through posterior right abdominal wall fascial planes, question incisional hernia.  Within limits of a nonenhanced exam, no additional focal abnormalities of the liver, spleen, pancreas, kidneys, or adrenal glands. Diverticulosis of the sigmoid colon without evidence of diverticulitis. Beam hardening artifacts from right hip prosthesis obscure portions of pelvis. Stomach decompressed. Bowel loops otherwise normal appearance.  No mass, adenopathy, free fluid or inflammatory process. Air within urinary bladder question prior instrumentation. Diffuse osseous demineralization with degenerative disc disease changes lumbar spine.  IMPRESSION: Posterior right flank hernia with partial herniation of right kidney. Sigmoid diverticulosis without evidence of diverticulitis. Extensive atherosclerotic disease including bilateral renal arteries. Anemia. Dense liver, which can be seen with amiodarone therapy, prior Thoratrast or gold colloid administration, hemochromatosis, and certain deposition diseases.  Original Report Authenticated By: Lollie Marrow, M.D.   Dg Chest 2 View  08/02/2011  *RADIOLOGY REPORT*  Clinical Data: Fall today.  Right chest pain with bruising. History of hypertension and congestive heart failure.  CHEST - 2 VIEW  Comparison: 03/10/2011.  Findings: The heart size and mediastinal contours are stable with aortic atherosclerosis.  There is stable chronic linear scarring in the right perihilar region and chronic central airway thickening. No edema, confluent airspace opacity or pleural effusion is identified.  The subacromial space of the right shoulder is narrowed. No acute osseous findings are seen.  IMPRESSION: Stable chronic lung disease.  No acute cardiopulmonary process.  Original  Report Authenticated By: Gerrianne Scale, M.D.   Dg Shoulder Right  08/02/2011  *RADIOLOGY REPORT*  Clinical Data: Pain and bruising secondary to a  fall.  RIGHT SHOULDER - 2+ VIEW  Comparison: 02/13/2006  Findings: There is no fracture or dislocation or other acute osseous abnormality.  Narrowing of the subacromial space is consistent with a chronic rotator cuff tear.  Chronic scarring or atelectasis in the right midzone, unchanged since chest x-ray of 05/10/2011.  IMPRESSION: No acute osseous abnormality of the right shoulder.  Probable chronic rotator cuff tear.  Original Report Authenticated By: Gwynn Burly, M.D.   Ct Head Wo Contrast  08/02/2011  *RADIOLOGY REPORT*  Clinical Data:  Dizziness and weakness, fall, bruising in the right eye.  CT HEAD WITHOUT CONTRAST CT CERVICAL SPINE WITHOUT CONTRAST  Technique:  Multidetector CT imaging of the head and cervical spine was performed following the standard protocol without intravenous contrast.  Multiplanar CT image reconstructions of the cervical spine were also generated.  Comparison:  Head CT 08/09/2009  CT HEAD  Findings: No acute intracranial hemorrhage.  No focal mass lesion. No CT evidence of acute infarction.   No midline shift or mass effect.  No hydrocephalus.  Basilar cisterns are patent.  There is generalized cortical atrophy and periventricular white matter hypodensities which are similar to prior.  Paranasal sinuses and mastoid air cells are clear.  Orbits are normal.  IMPRESSION:  1.  No acute intracranial findings. 2.  Atrophy and microvascular disease are unchanged from prior.  CT CERVICAL SPINE  Findings: No prevertebral soft tissue swelling.  There is normal alignment of the  cervical vertebral bodies.  There are multiple levels of loss of disc space which is felt to be degenerative in nature.  Normal facet articulation.  There is facet hypertrophy present.  Normal craniocervical junction.  No evidence of epidural paraspinal hematoma.  There is mild pleural parenchymal thickening at the left lung apex. This appears chronic on comparison chest radiographs.  IMPRESSION:  1.  No evidence  of cervical spine fracture.  2.  Multilevel disc osteophytic disease. 3.  Chronic pleural parenchymal thickening at the left lung apex.  Original Report Authenticated By: Genevive Bi, M.D.   Ct Cervical Spine Wo Contrast  08/02/2011  *RADIOLOGY REPORT*  Clinical Data:  Dizziness and weakness, fall, bruising in the right eye.  CT HEAD WITHOUT CONTRAST CT CERVICAL SPINE WITHOUT CONTRAST  Technique:  Multidetector CT imaging of the head and cervical spine was performed following the standard protocol without intravenous contrast.  Multiplanar CT image reconstructions of the cervical spine were also generated.  Comparison:  Head CT 08/09/2009  CT HEAD  Findings: No acute intracranial hemorrhage.  No focal mass lesion. No CT evidence of acute infarction.   No midline shift or mass effect.  No hydrocephalus.  Basilar cisterns are patent.  There is generalized cortical atrophy and periventricular white matter hypodensities which are similar to prior.  Paranasal sinuses and mastoid air cells are clear.  Orbits are normal.  IMPRESSION:  1.  No acute intracranial findings. 2.  Atrophy and microvascular disease are unchanged from prior.  CT CERVICAL SPINE  Findings: No prevertebral soft tissue swelling.  There is normal alignment of the  cervical vertebral bodies.  There are multiple levels of loss of disc space which is felt to be degenerative in nature.  Normal facet articulation.  There is facet hypertrophy present.  Normal craniocervical junction.  No evidence of epidural paraspinal hematoma.  There  is mild pleural parenchymal thickening at the left lung apex. This appears chronic on comparison chest radiographs.  IMPRESSION:  1.  No evidence of cervical spine fracture.  2.  Multilevel disc osteophytic disease. 3.  Chronic pleural parenchymal thickening at the left lung apex.  Original Report Authenticated By: Genevive Bi, M.D.   Ct Maxillofacial Wo Cm  08/02/2011  *RADIOLOGY REPORT*  Clinical Data: Right sided  face pain and bruising secondary to a fall.  Confusion.  CT MAXILLOFACIAL WITHOUT CONTRAST  Technique:  Multidetector CT imaging of the maxillofacial structures was performed. Multiplanar CT image reconstructions were also generated.  Comparison: None.  Findings: There is slight soft tissue swelling around the right orbit.  There is no fracture or sinus opacification.  IMPRESSION: Soft tissue swelling around the right orbit.  Otherwise, normal.  Original Report Authenticated By: Gwynn Burly, M.D.    Review of Systems  Constitutional: Negative.   HENT: Negative.   Eyes: Negative.   Respiratory: Negative.   Cardiovascular: Negative.   Gastrointestinal: Negative.        Mild left-sided abdominal pain, around the suprapubic area.  Genitourinary: Negative.   Musculoskeletal: Positive for joint pain and falls.       Right shoulder pain.  Skin: Negative.   Neurological: Negative.   Endo/Heme/Allergies: Negative.   Psychiatric/Behavioral: Negative.     Blood pressure 200/80, pulse 75, temperature 98.4 F (36.9 C), temperature source Oral, resp. rate 16, height 5\' 6"  (1.676 m), weight 57.1 kg (125 lb 14.1 oz), SpO2 97.00%. Physical Exam  Constitutional: She appears well-developed and well-nourished. No distress.  HENT:  Head: Normocephalic and atraumatic.  Right Ear: External ear normal.  Left Ear: External ear normal.  Nose: Nose normal.  Mouth/Throat: Oropharynx is clear and moist. No oropharyngeal exudate.  Eyes:       Bruise around the right eye. Patient is able to see in both eyes.  Neck: Normal range of motion. Neck supple.  Cardiovascular: Normal rate and regular rhythm.   Respiratory: Effort normal and breath sounds normal. No respiratory distress. She has no wheezes. She has no rales.  GI: Soft. Bowel sounds are normal. She exhibits no distension. There is no rebound.       Mild tenderness in the left side of the suprapubic area.  Musculoskeletal:       Patient has pain on  abducting the right shoulder. Multiple bruises on upper extremities.  Neurological: She is alert.       Patient is oriented to her name she knows her primary care physician she knows where she is but not the date and time. Moves upper and lower extremities except the right shoulder movement is decreased because the right shoulder pain.  Skin: She is not diaphoretic.  Psychiatric: Her behavior is normal.     Assessment/Plan #1.Fall/near syncope - monitor patient in telemetry to be sure there was no arrhythmia. As per the ER physician the patient was not orthostatic in the ER. We will recheck orthostatic in a.m. get PTOT consult and at this time we'll gently hydrate. #2. UTI - patient has recurrent UTIs. Patient has been on restart her on ceftriaxone which we will continue follow urine cultures. #3. Chronic kidney disease - probably stage IV. Patient's creatinine is usually around 2 now it is around 2.2 with mildly elevated BUN. Patient may be mildly dehydrated for which we'll gently hydrate and recheck metabolic panel in a.m. #4. Myelodysplastic syndrome - recent oncology notes states that patient is on  a candidate for anymore aggressive measures except for supportive care with blood transfusion which patient did receive to 3 days ago. Closely follow CBC. Patient is afebrile at this time. #5. Right periorbital hematoma from fall - closely observe. #6. Right shoulder pain with x-ray showing chronic rotator cuff tear - patient states she she usually follows with Dr.Duda for this. At this time we will continue her pain relief medications.  Patient lives alone. We'll get PTOT consult and see the patient safe for discharge for home or needs rehabilitation.  CODE STATUS - full code.  Deland Slocumb N. 08/03/2011, 2:11 AM

## 2011-08-03 NOTE — Progress Notes (Signed)
Palliative Medicine Team consult requested by Dr Jomarie Longs and  Dr Clelia Croft for goals of care and code status discussion; spoke with patient at bedside x2;  patient stated 'I don't want to talk about death..I know things don't look good with my blood disease, Dr Clelia Croft has spoken to me about that..I don't need to talk about it'   When asked who will speak for her if something were to happen and she couldn't speak for herself she said "[name]...but I don't want to ask him to come- he has done so much... When I tried to explain that having this conversation was to make sure [name] and all those caring for her in the hospital knew what she wanted and could plan for what would be needed when she was ready to leave the hospital -she said 'I put my trust in God.Marland Kitcheni've written a will...' When this RN tried to explain that while she may have written a will, there is no paperwork on her chart to indicate her wishes. Ms Dysert asked if this discussion could wait until she didn't feel so tied- This RN assured her we were trying to arrange for a meeting tomorrow and asked if I could call and speak to [name] to get the time he would be in the hospital and Ms Dia stated she did not want me calling [name]; she would plan to speak to him later and let me know tomorrow.  Ms Filsaime agreed that this RN will check with her tomorrow to follow-up on possible meeting time   This RN called Dr Jomarie Longs to make her aware of the conversation; Dr Jomarie Longs indicated [name] was aware PMT was being asked to speak with them.  This RN attempted to call number for [name] [phone]) and could not leave a message. This RN will follow up tomorrow with Ms Mottley as agreed.   Valente David, RN 08/03/2011, 5:49 PM Palliative Medicine Team RN Liaison [phone]

## 2011-08-03 NOTE — Progress Notes (Signed)
Pt seen and examined, admitted this am MDS with pancytopenia, transfusion dependent Severe thrombocytopenia plts<5k UTI CKD 4 H/o RCC s/p ablation Plan: IVF IV rocephin, FU urine cultures Transfuse platelets if active bleeding Consult Dr.Shadad, it appears that MDS is progressing, not candidate for aggressive treatment, is she at the point where a palliative consultation is appropriate, she is a full code as of now.  Zannie Cove, MD [facility] hospitalist [phone]

## 2011-08-03 NOTE — ED Provider Notes (Signed)
Medical screening examination/treatment/procedure(s) were conducted as a shared visit with non-physician practitioner(s) and myself.  I personally evaluated the patient during the encounter  Found down at home. Patient thinks that she tripped but unable to recall events.  Ecchymosis R periorbital, R shoulder. Hx MDS with chronic thrombocytopenia and anemia. Patient lives alone.  Extensive conversation with patient and son regarding home safety and falls especially with bleeding risk from thrombocytopenia. +UTI without traumatic injury.  Agreeable to admit for PT/OT.  Will need to consider palliative care as well.  Glynn Octave, MD 08/03/11 1123

## 2011-08-03 NOTE — ED Notes (Addendum)
Called report to [id number].  Pt will go from CT to [id number]

## 2011-08-03 NOTE — ED Provider Notes (Signed)
History     CSN: [id number]  Arrival date & time 08/02/11  1634   First MD Initiated Contact with Patient 08/02/11 1635      Chief Complaint  Patient presents with  . Weakness  . Dizziness  . Fall   HPI Patient presents to the emergency department following a fall at her home last night and in which she does not know what made her fall.  Her son found her on the floor this morning.  He noted that she had bruising around her eye and on her shoulder and upper chest.  Patient denies chest pain, shortness of breath, vomiting/nausea, abdominal pain, back pain, headache, visual changes, fever, or edema.  Patient states that she tried to get off the floor but was not able to. Past Medical History  Diagnosis Date  . Acquired pancytopenia   . Hypertension   . Depression   . Osteoporosis   . HLD (hyperlipidemia)   . Fracture, hip     bilateral  . CVA (cerebral infarction)   . Chronic kidney disease   . Renal cell carcinoma   . Stricture esophagus   . Glaucoma   . CHF (congestive heart failure)   . Systolic dysfunction   . Raynaud disease   . Hyperhomocysteinemia   . Lymphocytic gastritis   . Anxiety   . DJD (degenerative joint disease)   . Gastric ulcer   . Myelodysplastic syndrome   . Hx of colonic polyps   . Myelodysplastic syndrome   . C. difficile colitis   . Diverticulosis   . Gout   . GERD (gastroesophageal reflux disease)   . Anxiety and depression   . Anemia   . Torn rotator cuff     right shoulder    Past Surgical History  Procedure Date  . Abdominal hysterectomy   . Cataract extraction   . Varicose vein surgery   . Tubal ligation   . Hip fracture surgery      bilateral  . Spine surgery   . Kidney surgery     for renal carcinoma    Family History  Problem Relation Age of Onset  . Breast cancer Sister   . Diabetes Brother   . Colon cancer Neg Hx     History  Substance Use Topics  . Smoking status: Never Smoker   . Smokeless tobacco: Never Used    . Alcohol Use: No    OB History    Grav Para Term Preterm Abortions TAB SAB Ect Mult Living                  Review of Systems All pertinent positives and negatives reviewed in the history of present illness  Allergies  Clonidine hydrochloride; Doxazosin mesylate; Doxycycline hyclate; Erythromycin ethylsuccinate; Meperidine hcl; Penicillins; and Sulfonamide derivatives  Home Medications   Current Outpatient Rx  Name Route Sig Dispense Refill  . ALPRAZOLAM 0.5 MG PO TABS Oral Take 0.5 mg by mouth at bedtime as needed. For sleep    . VITAMIN C 1000 MG PO TABS Oral Take 1,000 mg by mouth daily.    Marland Kitchen BISOPROLOL FUMARATE 10 MG PO TABS Oral Take 10 mg by mouth daily.    Marland Kitchen CRANBERRY 500 MG PO CAPS Oral Take 1 capsule by mouth daily.    Marland Kitchen FAMOTIDINE 40 MG PO TABS Oral Take 1 tablet (40 mg total) by mouth daily. 90 tablet 3  . FELODIPINE ER 10 MG PO TB24 Oral Take 1 tablet (10 mg  total) by mouth daily. 90 tablet 3  . FOLIC ACID 1 MG PO TABS Oral Take 1 tablet (1 mg total) by mouth daily. 90 tablet 1  . HYDRALAZINE HCL 25 MG PO TABS Oral Take 1 tablet (25 mg total) by mouth 3 (three) times daily. 270 tablet 1  . HYDROCODONE-ACETAMINOPHEN 5-500 MG PO TABS Oral Take 0.5 tablets by mouth 3 (three) times daily.    Marland Kitchen METHENAMINE MANDELATE 1 G PO TABS Oral Take 1,000 mg by mouth Twice daily.    Marland Kitchen ULORIC 40 MG PO TABS Oral Take 1 tablet by mouth daily.      BP 208/75  Pulse 75  Temp(Src) 98.3 F (36.8 C) (Oral)  Resp 16  SpO2 98%  Physical Exam  Constitutional: She is oriented to person, place, and time. She appears well-developed and well-nourished.  HENT:  Head: Normocephalic and atraumatic.       Patient has swelling and bruising around her right eye.  Eyes: Pupils are equal, round, and reactive to light.  Cardiovascular: Normal rate and normal heart sounds.  Exam reveals no gallop and no friction rub.   No murmur heard. Pulmonary/Chest: Effort normal and breath sounds normal. No  respiratory distress. She has no wheezes. She has no rales.  Musculoskeletal:       Patient has bruising noted to her upper right shoulder and right chest wall anteriorly.  Neurological: She is alert and oriented to person, place, and time. She has normal strength. No sensory deficit. Coordination normal. GCS eye subscore is 4. GCS verbal subscore is 5. GCS motor subscore is 6.  Skin: Skin is warm and dry.    ED Course  Procedures (including critical care time)  Labs Reviewed  CARDIAC PANEL(CRET KIN+CKTOT+MB+TROPI) - Abnormal; Notable for the following:    Relative Index 2.6 (*)    All other components within normal limits  CBC - Abnormal; Notable for the following:    WBC 1.0 (*)    RBC 3.57 (*)    Hemoglobin 10.7 (*)    HCT 31.0 (*)    RDW 16.7 (*)    Platelets <30 (*)    All other components within normal limits  DIFFERENTIAL - Abnormal; Notable for the following:    Neutrophils Relative 39 (*)    Monocytes Relative 2 (*)    Eosinophils Relative 17 (*)    Neutro Abs 0.4 (*)    Lymphs Abs 0.4 (*)    Monocytes Absolute 0.0 (*)    All other components within normal limits  COMPREHENSIVE METABOLIC PANEL - Abnormal; Notable for the following:    Glucose, Bld 112 (*)    BUN 50 (*)    Creatinine, Ser 2.21 (*)    Albumin 2.8 (*)    Alkaline Phosphatase 134 (*)    GFR calc non Af Amer 19 (*)    GFR calc Af Amer 22 (*)    All other components within normal limits  URINALYSIS, ROUTINE W REFLEX MICROSCOPIC - Abnormal; Notable for the following:    APPearance CLOUDY (*)    Hgb urine dipstick MODERATE (*)    Protein, ur >300 (*)    Leukocytes, UA TRACE (*)    All other components within normal limits  URINE MICROSCOPIC-ADD ON - Abnormal; Notable for the following:    Squamous Epithelial / LPF FEW (*)    Bacteria, UA MANY (*)    Casts HYALINE CASTS (*)    All other components within normal limits  PROTIME-INR  URINE  CULTURE   Dg Chest 2 View  08/02/2011  *RADIOLOGY REPORT*   Clinical Data: Fall today.  Right chest pain with bruising. History of hypertension and congestive heart failure.  CHEST - 2 VIEW  Comparison: 03/10/2011.  Findings: The heart size and mediastinal contours are stable with aortic atherosclerosis.  There is stable chronic linear scarring in the right perihilar region and chronic central airway thickening. No edema, confluent airspace opacity or pleural effusion is identified.  The subacromial space of the right shoulder is narrowed. No acute osseous findings are seen.  IMPRESSION: Stable chronic lung disease.  No acute cardiopulmonary process.  Original Report Authenticated By: Gerrianne Scale, M.D.   Dg Shoulder Right  08/02/2011  *RADIOLOGY REPORT*  Clinical Data: Pain and bruising secondary to a fall.  RIGHT SHOULDER - 2+ VIEW  Comparison: 02/13/2006  Findings: There is no fracture or dislocation or other acute osseous abnormality.  Narrowing of the subacromial space is consistent with a chronic rotator cuff tear.  Chronic scarring or atelectasis in the right midzone, unchanged since chest x-ray of 05/10/2011.  IMPRESSION: No acute osseous abnormality of the right shoulder.  Probable chronic rotator cuff tear.  Original Report Authenticated By: Gwynn Burly, M.D.   Ct Head Wo Contrast  08/02/2011  *RADIOLOGY REPORT*  Clinical Data:  Dizziness and weakness, fall, bruising in the right eye.  CT HEAD WITHOUT CONTRAST CT CERVICAL SPINE WITHOUT CONTRAST  Technique:  Multidetector CT imaging of the head and cervical spine was performed following the standard protocol without intravenous contrast.  Multiplanar CT image reconstructions of the cervical spine were also generated.  Comparison:  Head CT 08/09/2009  CT HEAD  Findings: No acute intracranial hemorrhage.  No focal mass lesion. No CT evidence of acute infarction.   No midline shift or mass effect.  No hydrocephalus.  Basilar cisterns are patent.  There is generalized cortical atrophy and periventricular  white matter hypodensities which are similar to prior.  Paranasal sinuses and mastoid air cells are clear.  Orbits are normal.  IMPRESSION:  1.  No acute intracranial findings. 2.  Atrophy and microvascular disease are unchanged from prior.  CT CERVICAL SPINE  Findings: No prevertebral soft tissue swelling.  There is normal alignment of the  cervical vertebral bodies.  There are multiple levels of loss of disc space which is felt to be degenerative in nature.  Normal facet articulation.  There is facet hypertrophy present.  Normal craniocervical junction.  No evidence of epidural paraspinal hematoma.  There is mild pleural parenchymal thickening at the left lung apex. This appears chronic on comparison chest radiographs.  IMPRESSION:  1.  No evidence of cervical spine fracture.  2.  Multilevel disc osteophytic disease. 3.  Chronic pleural parenchymal thickening at the left lung apex.  Original Report Authenticated By: Genevive Bi, M.D.   Ct Cervical Spine Wo Contrast  08/02/2011  *RADIOLOGY REPORT*  Clinical Data:  Dizziness and weakness, fall, bruising in the right eye.  CT HEAD WITHOUT CONTRAST CT CERVICAL SPINE WITHOUT CONTRAST  Technique:  Multidetector CT imaging of the head and cervical spine was performed following the standard protocol without intravenous contrast.  Multiplanar CT image reconstructions of the cervical spine were also generated.  Comparison:  Head CT 08/09/2009  CT HEAD  Findings: No acute intracranial hemorrhage.  No focal mass lesion. No CT evidence of acute infarction.   No midline shift or mass effect.  No hydrocephalus.  Basilar cisterns are patent.  There is generalized  cortical atrophy and periventricular white matter hypodensities which are similar to prior.  Paranasal sinuses and mastoid air cells are clear.  Orbits are normal.  IMPRESSION:  1.  No acute intracranial findings. 2.  Atrophy and microvascular disease are unchanged from prior.  CT CERVICAL SPINE  Findings: No  prevertebral soft tissue swelling.  There is normal alignment of the  cervical vertebral bodies.  There are multiple levels of loss of disc space which is felt to be degenerative in nature.  Normal facet articulation.  There is facet hypertrophy present.  Normal craniocervical junction.  No evidence of epidural paraspinal hematoma.  There is mild pleural parenchymal thickening at the left lung apex. This appears chronic on comparison chest radiographs.  IMPRESSION:  1.  No evidence of cervical spine fracture.  2.  Multilevel disc osteophytic disease. 3.  Chronic pleural parenchymal thickening at the left lung apex.  Original Report Authenticated By: Genevive Bi, M.D.   Ct Maxillofacial Wo Cm  08/02/2011  *RADIOLOGY REPORT*  Clinical Data: Right sided face pain and bruising secondary to a fall.  Confusion.  CT MAXILLOFACIAL WITHOUT CONTRAST  Technique:  Multidetector CT imaging of the maxillofacial structures was performed. Multiplanar CT image reconstructions were also generated.  Comparison: None.  Findings: There is slight soft tissue swelling around the right orbit.  There is no fracture or sinus opacification.  IMPRESSION: Soft tissue swelling around the right orbit.  Otherwise, normal.  Original Report Authenticated By: Gwynn Burly, M.D.     1. Weakness   2. Fall   3. Syncope    The patient has been seen by the attending Physician. Patient needs admission to the hospital for this is something that she does not know what caused her to fall whether she passed out or not.  She does describe some weakness.  Patient would like to home and we advised her that since she is having all these multiple issues we need to evaluate her further.  MDM  MDM Reviewed: previous chart, nursing note and vitals Reviewed previous: labs Interpretation: labs, ECG, CT scan and x-ray Consults: admitting MD    Date: 08/03/2011  Rate:73  Rhythm: normal sinus rhythm  QRS Axis: normal  Intervals: normal   ST/T Wave abnormalities: normal  Conduction Disutrbances:none  Narrative Interpretation:   Old EKG Reviewed: unchanged            Carlyle Dolly, PA-C 08/03/11 0041

## 2011-08-03 NOTE — ED Notes (Signed)
Pt finished contrast fluid 

## 2011-08-03 NOTE — Progress Notes (Signed)
Occupational Therapy Evaluation Patient Details Name: [name] MRN: [id number] DOB: 07-05-1925 Today's Date: 08/03/2011  Problem List:  Patient Active Problem List  Diagnoses  . MYELODYSPLASTIC SYNDROME  . HYPERLIPIDEMIA  . Gout, unspecified  . ANXIETY DEPRESSION  . GLAUCOMA NOS  . HYPERTENSION  . CONGESTIVE HEART FAILURE, SYSTOLIC DYSFUNCTION  . CVA  . GERD  . HIATAL HERNIA  . DIVERTICULOSIS OF COLON  . KIDNEY DISEASE, CHRONIC NOS  . OSTEOPOROSIS  . PERSONAL HX COLONIC POLYPS  . COLITIS, HX OF  . RENAL CELL CANCER  . Near syncope  . Fall  . Weakness    Past Medical History:  Past Medical History  Diagnosis Date  . Acquired pancytopenia   . Hypertension   . Depression   . Osteoporosis   . HLD (hyperlipidemia)   . Fracture, hip     bilateral  . CVA (cerebral infarction)   . Chronic kidney disease   . Renal cell carcinoma   . Stricture esophagus   . Glaucoma   . CHF (congestive heart failure)   . Systolic dysfunction   . Raynaud disease   . Hyperhomocysteinemia   . Lymphocytic gastritis   . Anxiety   . DJD (degenerative joint disease)   . Gastric ulcer   . Myelodysplastic syndrome   . Hx of colonic polyps   . Myelodysplastic syndrome   . C. difficile colitis   . Diverticulosis   . Gout   . GERD (gastroesophageal reflux disease)   . Anxiety and depression   . Anemia   . Torn rotator cuff     right shoulder   Past Surgical History:  Past Surgical History  Procedure Date  . Abdominal hysterectomy   . Cataract extraction   . Varicose vein surgery   . Tubal ligation   . Hip fracture surgery      bilateral  . Spine surgery   . Kidney surgery     for renal carcinoma    OT Assessment/Plan/Recommendation OT Assessment Clinical Impression Statement: Pt presents to OT with decreased I with ADL activity s/p fall at home and hospitalization.  Pt will benefit from skilled OT to increase I with ADL activity and return to PLOF OT  Recommendation/Assessment: Patient will need skilled OT in the acute care venue OT Problem List: Decreased strength;Decreased activity tolerance;Decreased safety awareness Barriers to Discharge: Decreased caregiver support OT Therapy Diagnosis : Generalized weakness OT Plan OT Frequency: Min 2X/week OT Treatment/Interventions: Self-care/ADL training;Patient/family education OT Recommendation Follow Up Recommendations: Skilled nursing facility;Home health OT;Other (comment) (depending on amount of assist at home) Equipment Recommended: Defer to next venue Individuals Consulted Consulted and Agree with Results and Recommendations: Patient     OT Evaluation Precautions/Restrictions  Restrictions Weight Bearing Restrictions: No      ADL ADL Grooming: Performed;Wash/dry hands;Minimal assistance Where Assessed - Grooming: Standing at sink Upper Body Bathing: Simulated;Minimal assistance Where Assessed - Upper Body Bathing: Unsupported;Sitting, bed Lower Body Bathing: Simulated;Moderate assistance Where Assessed - Lower Body Bathing: Sit to stand from bed Upper Body Dressing: Performed;Minimal assistance Where Assessed - Upper Body Dressing: Sitting, bed;Unsupported Lower Body Dressing: Performed;Moderate assistance Where Assessed - Lower Body Dressing: Sit to stand from bed Toilet Transfer: Performed Toilet Transfer Method: Ambulating;Other (comment) (walker) Toilet Transfer Equipment: Comfort height toilet;Grab bars Toileting - Clothing Manipulation: Performed;Minimal assistance Where Assessed - Glass blower/designer Manipulation: Standing Toileting - Hygiene: Performed;Minimal assistance Where Assessed - Toileting Hygiene: Standing Vision/Perception  Vision - History Baseline Vision: Wears glasses only for reading  Patient Visual Report: No change from baseline    Extremity Assessment RUE Assessment RUE Assessment: Within Functional Limits (pt reported R arm sore) LUE  Assessment LUE Assessment: Within Functional Limits    End of Session OT - End of Session Activity Tolerance: Patient limited by fatigue Patient left: in bed (with PT present) General Behavior During Session: [facility] for tasks performed Cognition: [facility] for tasks performed   [facility], [place] 08/03/2011, 3:44 PM

## 2011-08-03 NOTE — Progress Notes (Signed)
IP PROGRESS NOTE  Subjective:   Patient know to me with end stage MDS. Patient admitted after a fall at home. No active bleeding noted.  Objective:  Vital signs in last 24 hours: Temp:  [98.1 F (36.7 C)-98.6 F (37 C)] 98.2 F (36.8 C) (03/04 0500) Pulse Rate:  [71-77] 77  (03/04 0703) Resp:  [16-18] 18  (03/04 0500) BP: (153-215)/(59-91) 158/71 mmHg (03/04 0703) SpO2:  [94 %-99 %] 96 % (03/04 0500) Weight:  [125 lb 10.6 oz (57 kg)-125 lb 14.1 oz (57.1 kg)] 125 lb 10.6 oz (57 kg) (03/04 0500) Weight change:  Last BM Date: 08/02/11  Intake/Output from previous day: 03/03 0701 - 03/04 0700 In: 175 [I.V.:175] Out: 250 [Urine:250]  Mouth: mucous membranes moist, pharynx normal without lesions Resp: clear to auscultation bilaterally Cardio: regular rate and rhythm, S1, S2 normal, no murmur, click, rub or gallop GI: soft, non-tender; bowel sounds normal; no masses,  no organomegaly Extremities: extremities normal, atraumatic, no cyanosis or edema  Portacath/PICC-without erythema  Lab Results:  Basename 08/03/11 0552 08/02/11 1803  WBC 0.9* 1.0*  HGB 8.2* 10.7*  HCT 23.8* 31.0*  PLT <5* <30*    BMET  Basename 08/03/11 0552 08/02/11 1803  NA 139 142  K 4.3 4.5  CL 111 110  CO2 21 22  GLUCOSE 99 112*  BUN 42* 50*  CREATININE 1.94* 2.21*  CALCIUM 8.9 9.8    Studies/Results: Ct Abdomen Pelvis Wo Contrast  08/03/2011  *RADIOLOGY REPORT*  Clinical Data:  Weakness, dizziness, fall, abdominal pain, nausea, vomiting, past history hypertension, renal cell carcinoma post surgery, CHF, myelodysplastic syndrome  CT ABDOMEN AND PELVIS WITHOUT CONTRAST  Technique:  Multidetector CT imaging of the abdomen and pelvis was performed following the standard protocol without intravenous contrast. Sagittal and coronal MPR images reconstructed from axial data set.  Comparison: 03/11/2011  Findings: Low attenuation of circulating blood consistent with anemia. Minimal bibasilar atelectasis.  Extensive atherosclerotic calcification including dense bilateral renal artery calcification. Radiodense liver with small hepatic cysts. Herniation on the right kidney through posterior right abdominal wall fascial planes, question incisional hernia.  Within limits of a nonenhanced exam, no additional focal abnormalities of the liver, spleen, pancreas, kidneys, or adrenal glands. Diverticulosis of the sigmoid colon without evidence of diverticulitis. Beam hardening artifacts from right hip prosthesis obscure portions of pelvis. Stomach decompressed. Bowel loops otherwise normal appearance.  No mass, adenopathy, free fluid or inflammatory process. Air within urinary bladder question prior instrumentation. Diffuse osseous demineralization with degenerative disc disease changes lumbar spine.  IMPRESSION: Posterior right flank hernia with partial herniation of right kidney. Sigmoid diverticulosis without evidence of diverticulitis. Extensive atherosclerotic disease including bilateral renal arteries. Anemia. Dense liver, which can be seen with amiodarone therapy, prior Thoratrast or gold colloid administration, hemochromatosis, and certain deposition diseases.  Original Report Authenticated By: Lollie Marrow, M.D.   Dg Chest 2 View  08/02/2011  *RADIOLOGY REPORT*  Clinical Data: Fall today.  Right chest pain with bruising. History of hypertension and congestive heart failure.  CHEST - 2 VIEW  Comparison: 03/10/2011.  Findings: The heart size and mediastinal contours are stable with aortic atherosclerosis.  There is stable chronic linear scarring in the right perihilar region and chronic central airway thickening. No edema, confluent airspace opacity or pleural effusion is identified.  The subacromial space of the right shoulder is narrowed. No acute osseous findings are seen.  IMPRESSION: Stable chronic lung disease.  No acute cardiopulmonary process.  Original Report Authenticated By: Kimberlee Nearing.  Purcell Mouton, M.D.   Dg  Shoulder Right  08/02/2011  *RADIOLOGY REPORT*  Clinical Data: Pain and bruising secondary to a fall.  RIGHT SHOULDER - 2+ VIEW  Comparison: 02/13/2006  Findings: There is no fracture or dislocation or other acute osseous abnormality.  Narrowing of the subacromial space is consistent with a chronic rotator cuff tear.  Chronic scarring or atelectasis in the right midzone, unchanged since chest x-ray of 05/10/2011.  IMPRESSION: No acute osseous abnormality of the right shoulder.  Probable chronic rotator cuff tear.  Original Report Authenticated By: Gwynn Burly, M.D.   Ct Head Wo Contrast  08/02/2011  *RADIOLOGY REPORT*  Clinical Data:  Dizziness and weakness, fall, bruising in the right eye.  CT HEAD WITHOUT CONTRAST CT CERVICAL SPINE WITHOUT CONTRAST  Technique:  Multidetector CT imaging of the head and cervical spine was performed following the standard protocol without intravenous contrast.  Multiplanar CT image reconstructions of the cervical spine were also generated.  Comparison:  Head CT 08/09/2009  CT HEAD  Findings: No acute intracranial hemorrhage.  No focal mass lesion. No CT evidence of acute infarction.   No midline shift or mass effect.  No hydrocephalus.  Basilar cisterns are patent.  There is generalized cortical atrophy and periventricular white matter hypodensities which are similar to prior.  Paranasal sinuses and mastoid air cells are clear.  Orbits are normal.  IMPRESSION:  1.  No acute intracranial findings. 2.  Atrophy and microvascular disease are unchanged from prior.  CT CERVICAL SPINE  Findings: No prevertebral soft tissue swelling.  There is normal alignment of the  cervical vertebral bodies.  There are multiple levels of loss of disc space which is felt to be degenerative in nature.  Normal facet articulation.  There is facet hypertrophy present.  Normal craniocervical junction.  No evidence of epidural paraspinal hematoma.  There is mild pleural parenchymal thickening at the left  lung apex. This appears chronic on comparison chest radiographs.  IMPRESSION:  1.  No evidence of cervical spine fracture.  2.  Multilevel disc osteophytic disease. 3.  Chronic pleural parenchymal thickening at the left lung apex.  Original Report Authenticated By: Genevive Bi, M.D.   Ct Cervical Spine Wo Contrast  08/02/2011  *RADIOLOGY REPORT*  Clinical Data:  Dizziness and weakness, fall, bruising in the right eye.  CT HEAD WITHOUT CONTRAST CT CERVICAL SPINE WITHOUT CONTRAST  Technique:  Multidetector CT imaging of the head and cervical spine was performed following the standard protocol without intravenous contrast.  Multiplanar CT image reconstructions of the cervical spine were also generated.  Comparison:  Head CT 08/09/2009  CT HEAD  Findings: No acute intracranial hemorrhage.  No focal mass lesion. No CT evidence of acute infarction.   No midline shift or mass effect.  No hydrocephalus.  Basilar cisterns are patent.  There is generalized cortical atrophy and periventricular white matter hypodensities which are similar to prior.  Paranasal sinuses and mastoid air cells are clear.  Orbits are normal.  IMPRESSION:  1.  No acute intracranial findings. 2.  Atrophy and microvascular disease are unchanged from prior.  CT CERVICAL SPINE  Findings: No prevertebral soft tissue swelling.  There is normal alignment of the  cervical vertebral bodies.  There are multiple levels of loss of disc space which is felt to be degenerative in nature.  Normal facet articulation.  There is facet hypertrophy present.  Normal craniocervical junction.  No evidence of epidural paraspinal hematoma.  There is mild pleural parenchymal thickening  at the left lung apex. This appears chronic on comparison chest radiographs.  IMPRESSION:  1.  No evidence of cervical spine fracture.  2.  Multilevel disc osteophytic disease. 3.  Chronic pleural parenchymal thickening at the left lung apex.  Original Report Authenticated By: Genevive Bi, M.D.   Ct Maxillofacial Wo Cm  08/02/2011  *RADIOLOGY REPORT*  Clinical Data: Right sided face pain and bruising secondary to a fall.  Confusion.  CT MAXILLOFACIAL WITHOUT CONTRAST  Technique:  Multidetector CT imaging of the maxillofacial structures was performed. Multiplanar CT image reconstructions were also generated.  Comparison: None.  Findings: There is slight soft tissue swelling around the right orbit.  There is no fracture or sinus opacification.  IMPRESSION: Soft tissue swelling around the right orbit.  Otherwise, normal.  Original Report Authenticated By: Gwynn Burly, M.D.    Medications: I have reviewed the patient's current medications.  Assessment/Plan: 1.Fall/near syncope  2. UTI  3. Chronic kidney disease  4. End stage MDS: Patient is becoming transfusion dependant at this time. There are no further intervention will be recommend other than supportive care. Despite our multiple conversations, she still do not grasp the gravity of her situation and she tells me that "I will be OK". Patient at risk of bleeding, infection and cardiac event makes her prognosis very poor.  I would not transfuse platelet unless she is bleeding. I would transfuse PRBC only if Hgb less 7 and symptomatic. I agree with palliative care medicine consult: goals of care and assist in code status.           LOS: 1 day   Oral Hallgren 08/03/2011, 9:12 AM

## 2011-08-04 LAB — URINE CULTURE
Colony Count: >=100000
Culture  Setup Time: 201303040404

## 2011-08-04 LAB — CBC
HCT: 24.9 % — ABNORMAL LOW (ref 36.0–46.0)
Hemoglobin: 8.5 g/dL — ABNORMAL LOW (ref 12.0–15.0)
MCH: 29.5 pg (ref 26.0–34.0)
MCHC: 34.1 g/dL (ref 30.0–36.0)

## 2011-08-04 MED ORDER — CLONIDINE HCL ER 0.1 MG PO TB12: 0.1000 mg | ORAL_TABLET | ORAL | Status: DC

## 2011-08-04 MED ORDER — HYDRALAZINE HCL 20 MG/ML IJ SOLN
5.0000 mg | Freq: Once | INTRAMUSCULAR | Status: AC
  Administered 2011-08-04: 5 mg via INTRAVENOUS
  Filled 2011-08-04: qty 1

## 2011-08-04 MED ORDER — LORAZEPAM 2 MG/ML IJ SOLN
0.5000 mg | Freq: Once | INTRAMUSCULAR | Status: AC
  Administered 2011-08-04: 0.5 mg via INTRAVENOUS
  Filled 2011-08-04: qty 1

## 2011-08-04 MED ORDER — CLONIDINE HCL 0.1 MG PO TABS
0.1000 mg | ORAL_TABLET | Freq: Two times a day (BID) | ORAL | Status: DC
  Administered 2011-08-04 – 2011-08-05 (×3): 0.1 mg via ORAL
  Filled 2011-08-04 (×6): qty 1

## 2011-08-04 NOTE — Progress Notes (Addendum)
Subjective: Doing well, trying to avoid a palliative meeting Denies any other complaints, ambulated some today  Objective: Vital signs in last 24 hours: Temp:  [97.8 F (36.6 C)-99 F (37.2 C)] 97.9 F (36.6 C) (03/05 0530) Pulse Rate:  [65-75] 75  (03/05 0624) Resp:  [16-18] 18  (03/05 0530) BP: (113-215)/(56-81) 178/72 mmHg (03/05 0624) SpO2:  [95 %-97 %] 97 % (03/05 0530) Weight:  [61 kg (134 lb 7.7 oz)] 61 kg (134 lb 7.7 oz) (03/05 0530) Weight change: 3.9 kg (8 lb 9.6 oz) Last BM Date: 08/03/11  Intake/Output from previous day: 03/04 0701 - 03/05 0700 In: 243 [P.O.:240; I.V.:3] Out: 250 [Urine:250] Total I/O In: -  Out: 201 [Urine:200; Stool:1]   Physical Exam: General: Alert, awake, oriented x3, in no acute distress. HEENT: No bruits, no goiter, briuse around R eye Heart: Regular rate and rhythm, without murmurs, rubs, gallops. Lungs: Clear to auscultation bilaterally. Abdomen: Soft, nontender, nondistended, positive bowel sounds. Extremities: No clubbing cyanosis or edema with positive pedal pulses. Neuro: Grossly intact, nonfocal.    Lab Results: Basic Metabolic Panel:  Basename 08/03/11 0552 08/02/11 1803  NA 139 142  K 4.3 4.5  CL 111 110  CO2 21 22  GLUCOSE 99 112*  BUN 42* 50*  CREATININE 1.94* 2.21*  CALCIUM 8.9 9.8  MG -- --  PHOS -- --   Liver Function Tests:  [facility] 08/03/11 0552 08/02/11 1803  AST 13 15  ALT 7 9  ALKPHOS 111 134*  BILITOT 0.3 0.4  PROT 4.9* 6.0  ALBUMIN 2.2* 2.8*   No results found for this basename: LIPASE:2,AMYLASE:2 in the last 72 hours No results found for this basename: AMMONIA:2 in the last 72 hours CBC:  Basename 08/03/11 0552 08/02/11 1803  WBC 0.9* 1.0*  NEUTROABS -- 0.4*  HGB 8.2* 10.7*  HCT 23.8* 31.0*  MCV 86.5 86.8  PLT <5* <30*   Cardiac Enzymes:  Basename 08/02/11 1803  CKTOTAL 137  CKMB 3.6  CKMBINDEX --  TROPONINI <0.30   BNP: No results found for this basename: PROBNP:3 in the  last 72 hours D-Dimer: No results found for this basename: DDIMER:2 in the last 72 hours CBG: No results found for this basename: GLUCAP:6 in the last 72 hours Hemoglobin A1C: No results found for this basename: HGBA1C in the last 72 hours Fasting Lipid Panel: No results found for this basename: CHOL,HDL,LDLCALC,TRIG,CHOLHDL,LDLDIRECT in the last 72 hours Thyroid Function Tests: No results found for this basename: TSH,T4TOTAL,FREET4,T3FREE,THYROIDAB in the last 72 hours Anemia Panel: No results found for this basename: VITAMINB12,FOLATE,FERRITIN,TIBC,IRON,RETICCTPCT in the last 72 hours Coagulation:  Basename 08/02/11 1803  LABPROT 13.3  INR 0.99   Urine Drug Screen: Drugs of Abuse  No results found for this basename: labopia, cocainscrnur, labbenz, amphetmu, thcu, labbarb    Alcohol Level: No results found for this basename: ETH:2 in the last 72 hours Urinalysis:  Basename 08/02/11 2050  COLORURINE YELLOW  LABSPEC 1.014  PHURINE 6.5  GLUCOSEU NEGATIVE  HGBUR MODERATE*  BILIRUBINUR NEGATIVE  KETONESUR NEGATIVE  PROTEINUR >300*  UROBILINOGEN 0.2  NITRITE NEGATIVE  LEUKOCYTESUR TRACE*    Recent Results (from the past 240 hour(s))  URINE CULTURE     Status: Normal (Preliminary result)   Collection Time   08/02/11  8:50 PM      Component Value Range Status Comment   Specimen Description URINE, CLEAN CATCH   Final    Special Requests NONE   Final    Culture  Setup Time  [id number]   Final    Colony Count >=100,000 COLONIES/ML   Final    Culture GRAM NEGATIVE RODS   Final    Report Status PENDING   Incomplete     Studies/Results: Ct Abdomen Pelvis Wo Contrast  08/03/2011  *RADIOLOGY REPORT*  Clinical Data:  Weakness, dizziness, fall, abdominal pain, nausea, vomiting, past history hypertension, renal cell carcinoma post surgery, CHF, myelodysplastic syndrome  CT ABDOMEN AND PELVIS WITHOUT CONTRAST  Technique:  Multidetector CT imaging of the abdomen and pelvis was  performed following the standard protocol without intravenous contrast. Sagittal and coronal MPR images reconstructed from axial data set.  Comparison: 03/11/2011  Findings: Low attenuation of circulating blood consistent with anemia. Minimal bibasilar atelectasis. Extensive atherosclerotic calcification including dense bilateral renal artery calcification. Radiodense liver with small hepatic cysts. Herniation on the right kidney through posterior right abdominal wall fascial planes, question incisional hernia.  Within limits of a nonenhanced exam, no additional focal abnormalities of the liver, spleen, pancreas, kidneys, or adrenal glands. Diverticulosis of the sigmoid colon without evidence of diverticulitis. Beam hardening artifacts from right hip prosthesis obscure portions of pelvis. Stomach decompressed. Bowel loops otherwise normal appearance.  No mass, adenopathy, free fluid or inflammatory process. Air within urinary bladder question prior instrumentation. Diffuse osseous demineralization with degenerative disc disease changes lumbar spine.  IMPRESSION: Posterior right flank hernia with partial herniation of right kidney. Sigmoid diverticulosis without evidence of diverticulitis. Extensive atherosclerotic disease including bilateral renal arteries. Anemia. Dense liver, which can be seen with amiodarone therapy, prior Thoratrast or gold colloid administration, hemochromatosis, and certain deposition diseases.  Original Report Authenticated By: Lollie Marrow, M.D.   Dg Chest 2 View  08/02/2011  *RADIOLOGY REPORT*  Clinical Data: Fall today.  Right chest pain with bruising. History of hypertension and congestive heart failure.  CHEST - 2 VIEW  Comparison: 03/10/2011.  Findings: The heart size and mediastinal contours are stable with aortic atherosclerosis.  There is stable chronic linear scarring in the right perihilar region and chronic central airway thickening. No edema, confluent airspace opacity or  pleural effusion is identified.  The subacromial space of the right shoulder is narrowed. No acute osseous findings are seen.  IMPRESSION: Stable chronic lung disease.  No acute cardiopulmonary process.  Original Report Authenticated By: Gerrianne Scale, M.D.   Dg Shoulder Right  08/02/2011  *RADIOLOGY REPORT*  Clinical Data: Pain and bruising secondary to a fall.  RIGHT SHOULDER - 2+ VIEW  Comparison: 02/13/2006  Findings: There is no fracture or dislocation or other acute osseous abnormality.  Narrowing of the subacromial space is consistent with a chronic rotator cuff tear.  Chronic scarring or atelectasis in the right midzone, unchanged since chest x-ray of 05/10/2011.  IMPRESSION: No acute osseous abnormality of the right shoulder.  Probable chronic rotator cuff tear.  Original Report Authenticated By: Gwynn Burly, M.D.   Ct Head Wo Contrast  08/02/2011  *RADIOLOGY REPORT*  Clinical Data:  Dizziness and weakness, fall, bruising in the right eye.  CT HEAD WITHOUT CONTRAST CT CERVICAL SPINE WITHOUT CONTRAST  Technique:  Multidetector CT imaging of the head and cervical spine was performed following the standard protocol without intravenous contrast.  Multiplanar CT image reconstructions of the cervical spine were also generated.  Comparison:  Head CT 08/09/2009  CT HEAD  Findings: No acute intracranial hemorrhage.  No focal mass lesion. No CT evidence of acute infarction.   No midline shift or mass effect.  No hydrocephalus.  Basilar cisterns are  patent.  There is generalized cortical atrophy and periventricular white matter hypodensities which are similar to prior.  Paranasal sinuses and mastoid air cells are clear.  Orbits are normal.  IMPRESSION:  1.  No acute intracranial findings. 2.  Atrophy and microvascular disease are unchanged from prior.  CT CERVICAL SPINE  Findings: No prevertebral soft tissue swelling.  There is normal alignment of the  cervical vertebral bodies.  There are multiple levels  of loss of disc space which is felt to be degenerative in nature.  Normal facet articulation.  There is facet hypertrophy present.  Normal craniocervical junction.  No evidence of epidural paraspinal hematoma.  There is mild pleural parenchymal thickening at the left lung apex. This appears chronic on comparison chest radiographs.  IMPRESSION:  1.  No evidence of cervical spine fracture.  2.  Multilevel disc osteophytic disease. 3.  Chronic pleural parenchymal thickening at the left lung apex.  Original Report Authenticated By: Genevive Bi, M.D.   Ct Cervical Spine Wo Contrast  08/02/2011  *RADIOLOGY REPORT*  Clinical Data:  Dizziness and weakness, fall, bruising in the right eye.  CT HEAD WITHOUT CONTRAST CT CERVICAL SPINE WITHOUT CONTRAST  Technique:  Multidetector CT imaging of the head and cervical spine was performed following the standard protocol without intravenous contrast.  Multiplanar CT image reconstructions of the cervical spine were also generated.  Comparison:  Head CT 08/09/2009  CT HEAD  Findings: No acute intracranial hemorrhage.  No focal mass lesion. No CT evidence of acute infarction.   No midline shift or mass effect.  No hydrocephalus.  Basilar cisterns are patent.  There is generalized cortical atrophy and periventricular white matter hypodensities which are similar to prior.  Paranasal sinuses and mastoid air cells are clear.  Orbits are normal.  IMPRESSION:  1.  No acute intracranial findings. 2.  Atrophy and microvascular disease are unchanged from prior.  CT CERVICAL SPINE  Findings: No prevertebral soft tissue swelling.  There is normal alignment of the  cervical vertebral bodies.  There are multiple levels of loss of disc space which is felt to be degenerative in nature.  Normal facet articulation.  There is facet hypertrophy present.  Normal craniocervical junction.  No evidence of epidural paraspinal hematoma.  There is mild pleural parenchymal thickening at the left lung apex.  This appears chronic on comparison chest radiographs.  IMPRESSION:  1.  No evidence of cervical spine fracture.  2.  Multilevel disc osteophytic disease. 3.  Chronic pleural parenchymal thickening at the left lung apex.  Original Report Authenticated By: Genevive Bi, M.D.   Ct Maxillofacial Wo Cm  08/02/2011  *RADIOLOGY REPORT*  Clinical Data: Right sided face pain and bruising secondary to a fall.  Confusion.  CT MAXILLOFACIAL WITHOUT CONTRAST  Technique:  Multidetector CT imaging of the maxillofacial structures was performed. Multiplanar CT image reconstructions were also generated.  Comparison: None.  Findings: There is slight soft tissue swelling around the right orbit.  There is no fracture or sinus opacification.  IMPRESSION: Soft tissue swelling around the right orbit.  Otherwise, normal.  Original Report Authenticated By: Gwynn Burly, M.D.    Medications: Scheduled Meds:   . bisoprolol  10 mg Oral Daily  . cefTRIAXone (ROCEPHIN)  IV  1 g Intravenous Q24H  . famotidine  40 mg Oral Daily  . febuxostat  40 mg Oral Daily  . felodipine  10 mg Oral Daily  . folic acid  1 mg Oral Daily  . hydrALAZINE  5 mg Intravenous Once  . hydrALAZINE  10 mg Oral TID  . methenamine  1,000 mg Oral BID  . sodium chloride  3 mL Intravenous Q12H  . vitamin C  1,000 mg Oral Daily  . DISCONTD: hydrALAZINE  25 mg Oral TID   Continuous Infusions:   . sodium chloride 100 mL/hr at 08/04/11 0404   PRN Meds:.acetaminophen, acetaminophen, ALPRAZolam, HYDROcodone-acetaminophen, ondansetron (ZOFRAN) IV, ondansetron  Assessment/Plan: 1. Near syncope/Orthostatic hypotension 2. End-stage MYELODYSPLASTIC SYNDROME transfusion dependent with severe thrombocytopenia and neutropenia 3. HTN 4. KIDNEY DISEASE, CHRONIC stage 5. RENAL CELL CANCER 6. GNR UTI Plan: Tele, EKG -unremarkable Continue IVF today, cut down hydralazine due to orthostatic hypotension, add low dose clonidine instead She is in denial of  her prognosis and trying to avoid a palliative meeting, encouraged her to let the meeting proceed and told her that we all felt that this was very important Left a message for [name] too. Interms of her MDS, last transfusion was on 3/1, per Dr.Shadad transfuse platelets only for active bleeding For UTI, continue rocephin, Cultures with 100K colonies of GNR, FU ID and sensitivities Full code, Disposition:pending palliative eval, declines SNF, she lives alone     LOS: 2 days   [facility] [phone] 08/04/2011, 7:57 AM

## 2011-08-04 NOTE — Progress Notes (Addendum)
Patient seen and evaluated. Records reviewed and met patient and with her friend [name] who was at bedside.  Summary of Goals:  1. DNR, established, some concerns on her part about legal issues surrounding this, but in the context of her goals and illness, she would not want ventilator or ACLS.  2. She wants to go home and maintain her independence for as long as possible-we talked about safety-I strongly recommend and would not advise going home without 24/7 caregiver and Hospice-if caregiver cannot be arranged then she will need SNF-she is agreeable to Hospice if it will help her stay at home, she has positive associations with Hospice Care.  3. SW consult placed, patient wants [name] Czornig to be her MPOA, given AD packet.  4. Some issues with LBP/arm pain, s/p fall. Oral vicodin prn-she refused meds because they made her sleepy, may need to address this in context of her discharge home, safety.

## 2011-08-04 NOTE — Progress Notes (Addendum)
Patient's blood pressure elevated throughout the night on 3/4,  BP at 2100 172/69. Patient  received a 10 mg tablet po hydralazine at 2146 for a blood pressure of 182/70. Craige Cotta NP notified, 5mg  of Hydralazine iv given this am. BP 199/74 at 0620. Will continue to monitor.

## 2011-08-04 NOTE — Progress Notes (Signed)
Late entry- Notified by  Junius Creamer CMRN, patient and family request services of Hospcie and Palliative Care of [facility]) after discharge.  Patient information reviewed with Dr Elliot Gurney, [facility] Medical Director hospice eligible with dx: MDS. Spoke with patient at bedside briefly and Juel Burrow, friend on phone @ [phone];  initiated education related to hospice services, philosophy and team approach to care with good understanding voiced by [name].  [name] stated he understood plan is for possible discharge tomorrow and he plans to transport Ms Jurek by personal vehicle to her home.   The patient shared with this Clinical research associate that she relies on her friend, [name] to help her out and would want him to make decisions if she could not- they are working on AD paperwork; [name] has been transporting her to her doctor appointments, etc- her husband is deceased; her son lives in [place], is a [place] Vet and has medical issues of his own so she does not involve him in any of her medical issues; and she has a sister who lives in [place] and comes to visit but is not involved with her care. She has hired someone to help out privately and will look at increasing the hours she has someone with her once she is home.  DME needs were discussed-  patient has several items already in her home from when her her husband was ill- and only request at this time is a [facility]   Initial paperwork faxed to [facility] in anticipation of d/c   Please notify [facility] when patient is ready to leave unit at d/c call [phone] (or [phone] if after 5 pm); [facility] information and contact numbers also given to [name] during conversation and informed this RN will follow up in am for additional questions or concerns.   Above information shared with Junius Creamer [facility]. Please call if I can be of further assistance.   Valente David, RN 08/04/2011, 5:50 PM [facility] Pall[facility] of [facility] Palliative  Medicine Team RN Liaison [phone]

## 2011-08-04 NOTE — Progress Notes (Signed)
Follow-up spoke with patient and friend [name] at bedside they are agreeable to PMT meeting today Tuesday 08/04/11 @ 11:30 am  Valente David, RN 08/04/2011, 9:49 AM Palliative Medicine Team RN Liaison [phone]

## 2011-08-04 NOTE — Consult Note (Signed)
Consult Note from the Palliative Medicine Team at [facility] Patient ZO:XWRU JEANETTA [name]      DOB: 1925/10/13      EAV:[id number]   Consult Requested by: Dr. Clelia Croft    PCP: Judie Petit, MD, MD Reason for Consultation:Goals of Care, Symptom Management, Care Coordination     Phone Number:[phone]  Assessment and Plan: 1. Code Status/scope of treatment: 1. DNR, extensive conversation, she clearly does not want ventilator or CPR, she is however struggling with what she perceives are the "legal" aspects of this and wants information sent to her attorney, I explained that this was a medical determination, not a legal one, she seem to hesitantly understand that this is different than a "living will". 2. Wants Dr. Clelia Croft to continue to guide medical aspects of her MDS, willing to continue to have transfusions and treatment for reversible conditions as tolerated and able. 2. Symptom Control: 1. Have chronic LBP and joint pain, but no other complaints-she doesn't like to take pain medication because it makes her sleepy, but tells me if she needs it she will ask for it.  3. Psycho/Social: 1. Estranged family, has a friend phil who helps her with her decision making, she clearly states she wants phil to be her HCPOA, I have given the the advance directive packet and asked SW to help complete. 4. Spiritual: 1. No identified needs, does not want to talk about "death" 5. Disposition: 1. Very strongly wants to go back home, refuses SNF and has capacity to do this, we talked about safety and the need for 24/7 in home caregiver. She is open to Hospice coming in to help guide her care and decisions. She is eligible for hospice under her MDS diagnosis or general debility. Pt recs were for acute care PT and SNF or for 24/7 in home care. [name] is going to help her set up 24/7 care. 2. Home with Hospice when medically stable   Patient Documents Completed or Given: Document Given Completed  Advanced Directives  Pkt yes   MOST    DNR yes yes  Gone from My Sight    Hard Choices      Brief HPI 78 year-old female with history of myelodysplastic syndrome, chronic kidney disease, hypertension, renal cell carcinoma status post ablation therapy who has had a recent transfusion because of low hemoglobin on March 1 was brought to the ER because patient had a fall at her home and was found on the floor by her friend who brought her to the ER. Requiring frequent transfusions, weak and debilitated.   ROS: Low back pain, bruising, no skin tears, "sore all over" No chest pain, fevers, no NVD, poor appetite   PMH:  Past Medical History  Diagnosis Date  . Acquired pancytopenia   . Hypertension   . Depression   . Osteoporosis   . HLD (hyperlipidemia)   . Fracture, hip     bilateral  . CVA (cerebral infarction)   . Chronic kidney disease   . Renal cell carcinoma   . Stricture esophagus   . Glaucoma   . CHF (congestive heart failure)   . Systolic dysfunction   . Raynaud disease   . Hyperhomocysteinemia   . Lymphocytic gastritis   . Anxiety   . DJD (degenerative joint disease)   . Gastric ulcer   . Myelodysplastic syndrome   . Hx of colonic polyps   . Myelodysplastic syndrome   . C. difficile colitis   . Diverticulosis   . Gout   .  GERD (gastroesophageal reflux disease)   . Anxiety and depression   . Anemia   . Torn rotator cuff     right shoulder     PSH: Past Surgical History  Procedure Date  . Abdominal hysterectomy   . Cataract extraction   . Varicose vein surgery   . Tubal ligation   . Hip fracture surgery      bilateral  . Spine surgery   . Kidney surgery     for renal carcinoma   I have reviewed the FH and SH and  If appropriate update it with new information. Allergies  Allergen Reactions  . Clonidine Hydrochloride     REACTION: Reaction per patient, can't take.  Also references hive on forehead \\T \ spts on chest from other meds  . Doxazosin Mesylate     REACTION:  holding due to itching  . Doxycycline Hyclate     REACTION: rash  . Erythromycin Ethylsuccinate     REACTION: unspecified  . Meperidine Hcl     REACTION: unspecified  . Penicillins     REACTION: unspecified  . Sulfonamide Derivatives     REACTION: unspecified   Scheduled Meds:   . bisoprolol  10 mg Oral Daily  . cefTRIAXone (ROCEPHIN)  IV  1 g Intravenous Q24H  . cloNIDine  0.1 mg Oral BID  . famotidine  40 mg Oral Daily  . febuxostat  40 mg Oral Daily  . felodipine  10 mg Oral Daily  . folic acid  1 mg Oral Daily  . hydrALAZINE  5 mg Intravenous Once  . hydrALAZINE  10 mg Oral TID  . methenamine  1,000 mg Oral BID  . sodium chloride  3 mL Intravenous Q12H  . vitamin C  1,000 mg Oral Daily  . DISCONTD: cloNIDine HCl  0.1 mg Oral 1 day or 1 dose  . DISCONTD: hydrALAZINE  25 mg Oral TID   Continuous Infusions:   . sodium chloride 100 mL/hr at 08/04/11 0404   PRN Meds:.acetaminophen, acetaminophen, ALPRAZolam, HYDROcodone-acetaminophen, ondansetron (ZOFRAN) IV, ondansetron    BP 198/72  Pulse 75  Temp(Src) 97.9 F (36.6 C) (Oral)  Resp 18  Ht 5\' 6"  (1.676 m)  Wt 61 kg (134 lb 7.7 oz)  BMI 21.71 kg/m2  SpO2 97%   PPS:50-60   Intake/Output Summary (Last 24 hours) at 08/04/11 1209 Last data filed at 08/04/11 1113  Gross per 24 hour  Intake    243 ml  Output    701 ml  Net   -458 ml    Physical Exam: Frail, AOx3, judgement and capacity intact. Facial bruising.  Labs: CBC    Component Value Date/Time   WBC 1.0* 08/04/2011 0855   WBC 1.0* 07/31/2011 0859   RBC 2.88* 08/04/2011 0855   RBC 2.61* 07/31/2011 0859   HGB 8.5* 08/04/2011 0855   HGB 7.6* 07/31/2011 0859   HCT 24.9* 08/04/2011 0855   HCT 23.1* 07/31/2011 0859   PLT 5* 08/04/2011 0855   PLT <6* 07/31/2011 0859   MCV 86.5 08/04/2011 0855   MCV 88.5 07/31/2011 0859   MCH 29.5 08/04/2011 0855   MCH 29.1 07/31/2011 0859   MCHC 34.1 08/04/2011 0855   MCHC 32.9 07/31/2011 0859   RDW 16.3* 08/04/2011 0855   RDW 18.0* 07/31/2011  0859   LYMPHSABS 0.0* 08/03/2011 0552   LYMPHSABS 0.7* 07/31/2011 0859   MONOABS 0.0* 08/03/2011 0552   MONOABS 0.0* 07/31/2011 0859   EOSABS 0.0 08/03/2011 0552   EOSABS 0.1  07/31/2011 0859   BASOSABS 0.0 08/03/2011 0552   BASOSABS 0.0 07/31/2011 0859    BMET    Component Value Date/Time   NA 139 08/03/2011 0552   K 4.3 08/03/2011 0552   CL 111 08/03/2011 0552   CO2 21 08/03/2011 0552   GLUCOSE 99 08/03/2011 0552   BUN 42* 08/03/2011 0552   CREATININE 1.94* 08/03/2011 0552   CALCIUM 8.9 08/03/2011 0552   GFRNONAA 23* 08/03/2011 0552   GFRAA 26* 08/03/2011 0552    CMP     Component Value Date/Time   NA 139 08/03/2011 0552   K 4.3 08/03/2011 0552   CL 111 08/03/2011 0552   CO2 21 08/03/2011 0552   GLUCOSE 99 08/03/2011 0552   BUN 42* 08/03/2011 0552   CREATININE 1.94* 08/03/2011 0552   CALCIUM 8.9 08/03/2011 0552   PROT 4.9* 08/03/2011 0552   ALBUMIN 2.2* 08/03/2011 0552   AST 13 08/03/2011 0552   ALT 7 08/03/2011 0552   ALKPHOS 111 08/03/2011 0552   BILITOT 0.3 08/03/2011 0552   GFRNONAA 23* 08/03/2011 0552   GFRAA 26* 08/03/2011 0552      Time In Time Out Total Time Spent with Patient Total Overall Time     50 minutes    Greater than 50%  of this time was spent counseling and coordinating care related to the above assessment and plan.

## 2011-08-04 NOTE — Progress Notes (Signed)
CARE MANAGEMENT NOTE 08/04/2011  Patient:  [name], [name]   Account Number:  [id number]  Date Initiated:  08/03/2011  Documentation initiated by:  Junius Creamer  Subjective/Objective Assessment:   adm w near syncope     Action/Plan:   lives alone, pcp dr Smitty Cords swords   Anticipated DC Date:  08/06/2011   Anticipated DC Plan:  HOME W HOME HEALTH SERVICES  In-house referral  Hospice / Palliative Care  Clinical Social Worker      DC Planning Services  CM consult      [facility] Choice  HOSPICE   Choice offered to / List presented to:  C-1 Patient   DME arranged  3-N-1      DME agency  [facility]     [facility] arranged  HH-1 RN  HH-6 SOCIAL WORKER      HH agency  HOSPICE   Status of service:   Medicare Important Message given?   (If response is "NO", the following Medicare IM given date fields will be blank) Date Medicare IM given:   Date Additional Medicare IM given:    Discharge Disposition:  HOME W HOSPICE CARE  Per UR Regulation:    Comments:  3/5 spoke w pt and friend phil. she would like to speak w sw about adv directives. sw ref made. went over list of hospice agencies and she chose [facility]. ref faxed and alerted [facility] w hospice of new referral. copy of hospice hhc agency list will be placed on chart. pt has 1 son but they have not been close and she relies on phil for assist at home. debbie Zakarie Sturdivant rn,bsn [phone]  3/4 debbie Camara Renstrom rn,bsn [phone]  [facility]  [facility] [place]  The [facility] has an affiliation with this company; however, you are under no obligation to use this agency. [phone] [address] [place], [place]  YQMVHQIO [phone] Fax [phone] [address] [place], [place]  [address]  [facility] [phone] Fax [phone] [address] [place], [place]  [address]  [facility] [place] [phone] Fax [phone] [address] [place], [place]  [address]  [facility] [phone] Fax [phone] [address] [place], [place]  [address]  [facility] [phone] [address] [place], [place]  [address]  [facility] [phone] Fax [phone] [address] [place], [place] [address]   Fuller Song  [phone] Fax [phone] [address] [place], [place]  [address]  [facility] [phone] Fax [phone] [address] [place], [place]  [address]  [facility] [place] [phone] Fax [phone] [address] [place], [place]  [address]  [facility] [phone] Fax [phone] [address] [place], [place]  [address]  [facility] [phone] Fax [phone] [address] [place], [place] [address]   [facility] [phone] Fax [phone] [address] [place], [place]  [address]  [facility] [place] [phone] Fax [phone] [address] [place], [place]  [address]   Stephania Fragmin  [phone] Fax [phone] [address] [place], [place]  [address]  [facility] [phone] Fax [phone] [address] [place], [place]  [address]  [facility] [place] [phone] Fax [phone] [address] [place], [place]  [address]  [facility] [phone] Fax [phone] [address] [place], [place]  [address]    [place]   [facility]  [phone] Fax [phone] [address] [place], [place]  [address]   [facility] [phone] Fax [phone] [address] [place], [place]  [address]  [facility] [phone] Fax [phone] [address] [place], [place]  [address]  [facility] [phone] Fax [phone] [address] [place], [place] [address]   [facility]  [facility] [phone] Fax [phone] [address] [place], [place]  [address]  [facility] [phone] Fax [phone] [address] [place], [place] [address]   [facility] [phone] Fax [phone] [address] [place], [place]  [address]  [facility]   [phone] [address] [place], [place]  [address]  [facility] [place] [phone] Fax [phone] [address] [place], [place]  [address]  [facility] [place] [phone] Fax [phone] [address] [place], [place]  [address]  [facility] [phone] Fax [phone] [address] [place], [place] [address]     Gertie Gowda  [phone] Fax [phone] [address] [place], [place]  [address]  [facility] [address] [place], [place]  [phone] [address] [place], [place]  [address]  [facility] [phone] Fax [phone] [address] [place], [place]  [address]  [facility] [phone] [address] [place] [address]  [place] [phone] Fax [phone] [address] [place], [place] [address]   [facility] [phone] Fax [phone] [address] [place], [place] [address]    [facility] [address] [place], [place]  [address] [address] [place], [place]  [address]  [facility] [phone] Fax [phone] [address] [place], [place]  [address]  [facility] [phone] Fax [phone] [address] [place], [place] [address]   [facility] [phone] Fax [phone] [place], [place]  [address]

## 2011-08-04 NOTE — Progress Notes (Signed)
Occupational Therapy Treatment Patient Details Name: [name] MRN: [id number] DOB: 1926/04/06 Today's Date: 08/04/2011  OT Assessment/Plan OT Assessment/Plan Follow Up Recommendations: Skilled nursing facility OT Goals Acute Rehab OT Goals OT Goal Formulation: With patient ADL Goals Pt Will Perform Eating: Independently ADL Goal: Eating - Progress: Goal set today Pt Will Perform Grooming: with set-up ADL Goal: Grooming - Progress: Goal set today Pt Will Perform Upper Body Bathing: with set-up;Sit to stand from chair ADL Goal: Upper Body Bathing - Progress: Goal set today Pt Will Perform Lower Body Bathing: Sit to stand from chair;with set-up ADL Goal: Lower Body Bathing - Progress: Goal set today Pt Will Perform Upper Body Dressing: Sit to stand from chair ADL Goal: Upper Body Dressing - Progress: Goal set today Pt Will Perform Lower Body Dressing: with set-up;Sit to stand from chair ADL Goal: Lower Body Dressing - Progress: Goal set today Pt Will Transfer to Toilet: with set-up;Comfort height toilet ADL Goal: Toilet Transfer - Progress: Goal set today  OT Treatment Precautions/Restrictions  Precautions Precautions: Fall   ADL ADL Eating/Feeding: Performed;Set up Ambulation Related to ADLs: Pt in bed attempting to reach lunch.  Family present.  OT explained role of OT and reccomendation for SNF.  Pt wants to get OOB to eat lunch,    Pt mod A with bed mobliity and min A with stand pivot transfer. ADL Comments: Explained in depth assistance pt would need at home and sister confirrmed pt does not have any consistent assist at home.  Pt very resistant to SNF.  Sister seemed to understand importance of assist at home, speicifcally with fall prevention. Mobility  Bed Mobility Sit to Supine: 3: Mod assist Sit to Supine - Details (indicate cue type and reason): Pt stated she was very sore and pt did need more help with bed mobility than yesterday Scooting to [facility]: 4: Min  assist Transfers Sit to Stand: 4: Min assist     End of Session OT - End of Session Activity Tolerance: Patient tolerated treatment well Patient left: in chair General Behavior During Session: [facility] for tasks performed Cognition: [facility] for tasks performed  Mekhi Sonn, [place] Kung  08/04/2011, 1:29 PM

## 2011-08-05 ENCOUNTER — Telehealth: Payer: Self-pay | Admitting: *Deleted

## 2011-08-05 LAB — CBC
MCH: 29.4 pg (ref 26.0–34.0)
MCV: 86 fL (ref 78.0–100.0)
Platelets: <5 10*3/uL — CL (ref 150–400)
RDW: 16.2 % — ABNORMAL HIGH (ref 11.5–15.5)

## 2011-08-05 LAB — BASIC METABOLIC PANEL
CO2: 19 mEq/L (ref 19–32)
Calcium: 8.7 mg/dL (ref 8.4–10.5)
Creatinine, Ser: 1.9 mg/dL — ABNORMAL HIGH (ref 0.50–1.10)
GFR calc Af Amer: 27 mL/min — ABNORMAL LOW (ref >90)

## 2011-08-05 LAB — PREPARE RBC (CROSSMATCH)

## 2011-08-05 MED ORDER — HYDRALAZINE HCL 20 MG/ML IJ SOLN
10.0000 mg | Freq: Four times a day (QID) | INTRAMUSCULAR | Status: DC | PRN
  Administered 2011-08-07: 10 mg via INTRAVENOUS
  Filled 2011-08-05: qty 1

## 2011-08-05 MED ORDER — LEVOFLOXACIN 250 MG PO TABS
250.0000 mg | ORAL_TABLET | Freq: Every day | ORAL | Status: DC
  Administered 2011-08-05 – 2011-08-07 (×3): 250 mg via ORAL
  Filled 2011-08-05 (×3): qty 1

## 2011-08-05 MED ORDER — HYDRALAZINE HCL 25 MG PO TABS
25.0000 mg | ORAL_TABLET | Freq: Three times a day (TID) | ORAL | Status: DC
  Administered 2011-08-05 (×2): 25 mg via ORAL
  Filled 2011-08-05 (×5): qty 1

## 2011-08-05 NOTE — Progress Notes (Signed)
PT Cancellation Note  Treatment cancelled today due to medical issues with patient which prohibited therapy.  Pt's HGB 6.9.    Lara Mulch 08/05/2011, 10:59 AM [phone]

## 2011-08-05 NOTE — Telephone Encounter (Signed)
Spoke with [employer] @ [facility] [place]. [phone].  Dr Clelia Croft agreed to be attending, with hospice physicians to cover symptom management.

## 2011-08-05 NOTE — Progress Notes (Signed)
Subjective: Patient sleepy  Objective: Vital signs in last 24 hours: Filed Vitals:   08/05/11 0138 08/05/11 0500 08/05/11 1058 08/05/11 1215  BP: 153/70 151/79 218/75 182/70  Pulse: 63 60  62  Temp: 98.8 F (37.1 C) 98.7 F (37.1 C)  98.4 F (36.9 C)  TempSrc: Oral   Oral  Resp: 20 18  18   Height:      Weight:  59.9 kg (132 lb 0.9 oz)    SpO2:  97%     Weight change: -1.1 kg (-2 lb 6.8 oz)  Intake/Output Summary (Last 24 hours) at 08/05/11 1243 Last data filed at 08/05/11 1230  Gross per 24 hour  Intake   1810 ml  Output    500 ml  Net   1310 ml    Physical Exam: General: Awake, Oriented, No acute distress. HEENT: EOMI. Neck: Supple CV: S1 and S2, rrr Lungs: Clear to ascultation bilaterally, no wheezing Abdomen: Soft, Nontender, Nondistended, +bowel sounds. Ext: Good pulses. Trace edema.   Lab Results:  [facility] 08/04/11 2316 08/03/11 0552  NA 141 139  K 4.3 4.3  CL 115* 111  CO2 19 21  GLUCOSE 111* 99  BUN 36* 42*  CREATININE 1.90* 1.94*  CALCIUM 8.7 8.9  MG -- --  PHOS -- --    Basename 08/03/11 0552 08/02/11 1803  AST 13 15  ALT 7 9  ALKPHOS 111 134*  BILITOT 0.3 0.4  PROT 4.9* 6.0  ALBUMIN 2.2* 2.8*   No results found for this basename: LIPASE:2,AMYLASE:2 in the last 72 hours  Basename 08/05/11 0638 08/04/11 0855 08/02/11 1803  WBC 0.8* 1.0* --  NEUTROABS -- -- 0.4*  HGB 6.9* 8.5* --  HCT 20.2* 24.9* --  MCV 86.0 86.5 --  PLT <5* 5* --    Basename 08/02/11 1803  CKTOTAL 137  CKMB 3.6  CKMBINDEX --  TROPONINI <0.30   No components found with this basename: POCBNP:3 No results found for this basename: DDIMER:2 in the last 72 hours No results found for this basename: HGBA1C:2 in the last 72 hours No results found for this basename: CHOL:2,HDL:2,LDLCALC:2,TRIG:2,CHOLHDL:2,LDLDIRECT:2 in the last 72 hours No results found for this basename: TSH,T4TOTAL,FREET3,T3FREE,THYROIDAB in the last 72 hours No results found for this basename:  VITAMINB12:2,FOLATE:2,FERRITIN:2,TIBC:2,IRON:2,RETICCTPCT:2 in the last 72 hours  Micro Results: Recent Results (from the past 240 hour(s))  URINE CULTURE     Status: Normal   Collection Time   08/02/11  8:50 PM      Component Value Range Status Comment   Specimen Description URINE, CLEAN CATCH   Final    Special Requests NONE   Final    Culture  Setup Time [id number]   Final    Colony Count >=100,000 COLONIES/ML   Final    Culture KLEBSIELLA PNEUMONIAE   Final    Report Status 08/04/2011 FINAL   Final    Organism ID, Bacteria KLEBSIELLA PNEUMONIAE   Final     Studies/Results: No results found.  Medications: I have reviewed the patient's current medications. Scheduled Meds:   . bisoprolol  10 mg Oral Daily  . cefTRIAXone (ROCEPHIN)  IV  1 g Intravenous Q24H  . cloNIDine  0.1 mg Oral BID  . famotidine  40 mg Oral Daily  . febuxostat  40 mg Oral Daily  . felodipine  10 mg Oral Daily  . folic acid  1 mg Oral Daily  . hydrALAZINE  10 mg Oral TID  . LORazepam  0.5 mg Intravenous Once  .  methenamine  1,000 mg Oral BID  . sodium chloride  3 mL Intravenous Q12H  . vitamin C  1,000 mg Oral Daily   Continuous Infusions:   . sodium chloride 100 mL/hr at 08/05/11 0634   PRN Meds:.acetaminophen, acetaminophen, ALPRAZolam, HYDROcodone-acetaminophen, ondansetron (ZOFRAN) IV, ondansetron  Assessment/Plan: 1. Near syncope/Orthostatic hypotension  2. End-stage MYELODYSPLASTIC SYNDROME transfusion dependent with severe thrombocytopenia and neutropenia - transfuse 2 units PRBCs 3. HTN  4. KIDNEY DISEASE, CHRONIC stage  5. RENAL CELL CANCER  6. GNR UTI - kleb  Plan:  Tele, EKG -unremarkable  Continue IVF today, cut down hydralazine due to orthostatic hypotension, add low dose clonidine instead  She is in denial of her prognosis and trying to avoid a palliative meeting, encouraged her to let the meeting proceed and told her that we all felt that this was very important  Left a message  for [name] too.  Interms of her MDS, last transfusion was on 3/1, per Dr.Shadad transfuse platelets only for active bleeding- requiring weekly transfusions  For UTI, continue rocephin, Cultures with 100K colonies of GNR, klebsiella- change to PO levaquin, renally dosed Full code,  Disposition: SNF Patient and family requests that LTAC be consulted  Long discussion with family/patient/ and sarah the social worker at  Bedside to formulate plan    LOS: 3 days  Denise Acoff, DO 08/05/2011, 12:43 PM

## 2011-08-05 NOTE — Progress Notes (Signed)
Patient's hemoglobin came back at 6.9 this am. [name] heathcare personnel notified, she explained that she did not want to transfuse at this time, and wanted to wait for attending physician to see the patient. Will continue to monitor.

## 2011-08-05 NOTE — Progress Notes (Signed)
Clinical Child psychotherapist received a consult for "Advanced Directives." CSW discussed discharge plans with PMT RN, Delray Alt, which were for pt to discharge home with hospice services. Per Delray Alt, pt was confused and it was questionable if pt was able complete Advanced Directives paperwork. CSW met with pt and pt's primary support person, [name], to address consult. Pt appeared to be more oriented. CSW discussed Advanced Directives paperwork. CSW also addressed discharge plan. Pt shared that she is agreeable to SNF placement. [name] shared that pt will not have 24 hour supervision needed in order for pt to return home with home health services or hospice. Pt declined hospice services in the home. CSW will initiate SNF search in [facility] and follow up with bed offers. CSW will also follow up on Advanced Directive paperwork as pt is reading over the paperwork.Notarization of Advanced Directive paperwork will take place when pt is oriented due to pt sundowning as reported by nursing report. CSW will continue to follow.   Dede Query, MSW, LCSWA Coverage for Team #2 [phone]

## 2011-08-06 LAB — CBC
HCT: 28.5 % — ABNORMAL LOW (ref 36.0–46.0)
MCHC: 35.1 g/dL (ref 30.0–36.0)
MCV: 84.6 fL (ref 78.0–100.0)
Platelets: <5 10*3/uL — CL (ref 150–400)
RDW: 15.7 % — ABNORMAL HIGH (ref 11.5–15.5)
WBC: 0.9 10*3/uL — CL (ref 4.0–10.5)

## 2011-08-06 LAB — TYPE AND SCREEN: Unit division: 0

## 2011-08-06 MED ORDER — AMLODIPINE BESYLATE 10 MG PO TABS
10.0000 mg | ORAL_TABLET | Freq: Every day | ORAL | Status: DC
  Administered 2011-08-06 – 2011-08-07 (×2): 10 mg via ORAL
  Filled 2011-08-06 (×2): qty 1

## 2011-08-06 MED ORDER — ALPRAZOLAM 0.5 MG PO TABS
0.5000 mg | ORAL_TABLET | Freq: Three times a day (TID) | ORAL | Status: DC | PRN
  Administered 2011-08-06: 0.5 mg via ORAL
  Filled 2011-08-06: qty 1

## 2011-08-06 MED ORDER — HYDRALAZINE HCL 50 MG PO TABS
50.0000 mg | ORAL_TABLET | Freq: Three times a day (TID) | ORAL | Status: DC
  Administered 2011-08-06 – 2011-08-07 (×4): 50 mg via ORAL
  Filled 2011-08-06 (×6): qty 1

## 2011-08-06 MED ORDER — AMLODIPINE BESYLATE 5 MG PO TABS
5.0000 mg | ORAL_TABLET | Freq: Every day | ORAL | Status: DC
  Filled 2011-08-06: qty 1

## 2011-08-06 NOTE — Progress Notes (Signed)
Follow up -patient's condition has changed, having issues with BP and hgb levels-spoke with patient and friend [name] at bedside-they state they now want to look at short term SNF rehabilitation for strength/conditioning and are continuing to discuss plans with SW and CM - at this time plan will not be for home with hospice.  Patient and friend [name] aware they can notify [facility]) should plans change, or at a later time.  Will notify [facility] about change in d/c plan.   Valente David, RN 08/06/2011, 2:18 PM [facility] Palliative Medicine Team RN Liaison [phone]

## 2011-08-06 NOTE — Progress Notes (Signed)
08/06/11 1031  PT Visit Information  Last PT Received On 08/06/11  Precautions  Precautions Fall  Restrictions  Weight Bearing Restrictions No  Bed Mobility  Bed Mobility Yes  Rolling Right 6: Modified independent (Device/Increase time);With rail  Supine to Sit 4: Min assist  Supine to Sit Details (indicate cue type and reason) req. min A to lift trunk and shoulders to upright sitting.  Sitting - Scoot to Edge of Bed 6: Modified independent (Device/Increase time)  Transfers  Transfers Yes  Sit to Stand 4: Min guard A;With upper extremity assist;From bed  Sit to Stand Details (indicate cue type and reason) req. verbal cues for hand placement   Stand to Sit With upper extremity assist;With armrests;To chair/3-in-1 (Min Guard A)  Stand to Sit Details verbal cues for safety awareness, demonstrated mild uncontrolled descent  Ambulation/Gait  Ambulation/Gait Yes  Ambulation/Gait Assistance 4: Min assist  Ambulation/Gait Assistance Details (indicate cue type and reason) req. frequent cues for safety awareness/avoiding obstacles in hallway.Frequent Assistance needed for RW management  Ambulation Distance (Feet) 300 Feet  Assistive device Rolling walker  Gait Pattern Step-through pattern  Exercises  Exercises General Lower Extremity  General Exercises - Lower Extremity  Ankle Circles/Pumps AROM;Strengthening;Both;20 reps;Supine  Long Arc Quad AROM;Strengthening;Both;10 reps;Seated  Heel Slides AROM;Strengthening;Both;10 reps;Supine  Hip ABduction/ADduction AROM;Strengthening;Both;10 reps;Seated  Straight Leg Raises AROM;Strengthening;Both;10 reps;Supine  Hip Flexion/Marching AROM;Strengthening;Both;10 reps;Seated  PT - End of Session  Equipment Utilized During Treatment Gait belt  Activity Tolerance Patient tolerated treatment well  Patient left in chair;with call bell in reach  Nurse Communication Mobility status for ambulation  General  Behavior During Session [facility] for tasks  performed  Cognition [facility] for tasks performed  PT - Assessment/Plan  Comments on Treatment Session Patient report no pain today. Was eager to get out of bed. Making good progress, ambulated 373ft vs 11ft previous PT session  PT Plan Discharge plan remains appropriate  PT Frequency Min 3X/week  Follow Up Recommendations Home health PT;Supervision/Assistance - 24 hour;Skilled nursing facility  Equipment Recommended None recommended by PT  Acute Rehab PT Goals  PT Goal Formulation With patient  Time For Goal Achievement 2 weeks  PT Goal: Sit to Stand - Progress Progressing toward goal  PT Goal: Stand to Sit - Progress Progressing toward goal  PT Transfer Goal: Bed to Chair/Chair to Bed - Progress Progressing toward goal  PT Goal: Ambulate - Progress Progressing toward goal     Ardyth Gal, SPTA 08/06/11

## 2011-08-06 NOTE — Progress Notes (Signed)
Pt. Very concerned there were worms on the ceiling and scared they are going to drop off into her bed. Reassurance given.   Called to room d/t men waxing floors outside her door. Pt. Extremely paranoid, agitated, reassurance given to no avail, attempted to given Xanax, refused. Pt. Continues to be very upset, scared to drink water, take pills, thinks we are trying to harm her. Attempted to reorient to no avail. Remained in room with pt. Refusing to have B/P taken. Continue to remain in room with pt. To alleviate paranoia and agitation.    2230 pt. Calmer took medication up to BR seems more oriented knows where she is now.

## 2011-08-06 NOTE — Progress Notes (Signed)
   CARE MANAGEMENT NOTE 08/06/2011  Patient:  [name], [name]   Account Number:  [id number]  Date Initiated:  08/03/2011  Documentation initiated by:  [name]  Subjective/Objective Assessment:   adm w near syncope     Action/Plan:   lives alone, pcp dr [name]   Anticipated DC Date:  08/06/2011   Anticipated DC Plan:  HOME W HOME HEALTH SERVICES  In-house referral  Hospice / Palliative Care  Clinical Social Worker      DC Planning Services  CM consult      [facility] Choice  HOSPICE   Choice offered to / List presented to:  C-1 Patient   DME arranged  3-N-1      DME agency  [facility]     [facility] arranged  HH-1 RN  HH-6 SOCIAL WORKER      HH agency  HOSPICE   Status of service:   Medicare Important Message given?   (If response is "NO", the following Medicare IM given date fields will be blank) Date Medicare IM given:   Date Additional Medicare IM given:    Discharge Disposition:  HOME W HOSPICE CARE  Per UR Regulation:    Comments:  08/06/11 9:27 Denise Cape RN, BSN [phone] spoke with [name] with [facility], she is looking at patient this am and wil let me know as soon as she is done if patient is appropriate for [facility].  3/5 spoke w pt and friend [name]. she would like to speak w sw about adv directives. sw ref made. went over list of hospice agencies and she [facility]. ref faxed and alerted [facility] w hospice of new referral. copy of hospice hhc agency list will be placed on chart. pt has 1 son but they have not been close and she relies on [name] for assist at home. Denise dowell rn,bsn [phone]  3/4 Denise dowell rn,bsn [phone]

## 2011-08-06 NOTE — Progress Notes (Signed)
[name], [place] [phone] 08/06/2011

## 2011-08-06 NOTE — Progress Notes (Signed)
Subjective: Denies pain, no SOB, no CP, no fevers, no chills More confused this morning- says she wants to go home  Objective: Vital signs in last 24 hours: Filed Vitals:   08/05/11 1725 08/05/11 2100 08/05/11 2157 08/06/11 0500  BP: 166/72 192/77 199/69 199/78  Pulse: 64 68  60  Temp: 97.8 F (36.6 C) 99 F (37.2 C)  98.7 F (37.1 C)  TempSrc: Oral Oral  Oral  Resp: 18 16  16   Height:      Weight:      SpO2:  97%  96%   Weight change:   Intake/Output Summary (Last 24 hours) at 08/06/11 0827 Last data filed at 08/05/11 1700  Gross per 24 hour  Intake   1210 ml  Output    602 ml  Net    608 ml    Physical Exam: General: Awake, confused, no acute distress HEENT: EOMI. Neck: Supple CV: S1 and S2, rrr Lungs: Clear to ascultation bilaterally, no wheezing Abdomen: Soft, Nontender, Nondistended, +bowel sounds. Ext: Good pulses. Trace edema. Skin: areas of bruising  Lab Results:  Basename 08/04/11 2316  NA 141  K 4.3  CL 115*  CO2 19  GLUCOSE 111*  BUN 36*  CREATININE 1.90*  CALCIUM 8.7  MG --  PHOS --   No results found for this basename: AST:2,ALT:2,ALKPHOS:2,BILITOT:2,PROT:2,ALBUMIN:2 in the last 72 hours No results found for this basename: LIPASE:2,AMYLASE:2 in the last 72 hours  Basename 08/05/11 0638 08/04/11 0855  WBC 0.8* 1.0*  NEUTROABS -- --  HGB 6.9* 8.5*  HCT 20.2* 24.9*  MCV 86.0 86.5  PLT <5* 5*   No results found for this basename: CKTOTAL:3,CKMB:3,CKMBINDEX:3,TROPONINI:3 in the last 72 hours No components found with this basename: POCBNP:3 No results found for this basename: DDIMER:2 in the last 72 hours No results found for this basename: HGBA1C:2 in the last 72 hours No results found for this basename: CHOL:2,HDL:2,LDLCALC:2,TRIG:2,CHOLHDL:2,LDLDIRECT:2 in the last 72 hours No results found for this basename: TSH,T4TOTAL,FREET3,T3FREE,THYROIDAB in the last 72 hours No results found for this basename:  VITAMINB12:2,FOLATE:2,FERRITIN:2,TIBC:2,IRON:2,RETICCTPCT:2 in the last 72 hours  Micro Results: Recent Results (from the past 240 hour(s))  URINE CULTURE     Status: Normal   Collection Time   08/02/11  8:50 PM      Component Value Range Status Comment   Specimen Description URINE, CLEAN CATCH   Final    Special Requests NONE   Final    Culture  Setup Time [id number]   Final    Colony Count >=100,000 COLONIES/ML   Final    Culture KLEBSIELLA PNEUMONIAE   Final    Report Status 08/04/2011 FINAL   Final    Organism ID, Bacteria KLEBSIELLA PNEUMONIAE   Final     Studies/Results: No results found.  Medications: I have reviewed the patient's current medications. Scheduled Meds:    . amLODipine  10 mg Oral Daily  . bisoprolol  10 mg Oral Daily  . famotidine  40 mg Oral Daily  . febuxostat  40 mg Oral Daily  . felodipine  10 mg Oral Daily  . folic acid  1 mg Oral Daily  . hydrALAZINE  50 mg Oral TID  . levofloxacin  250 mg Oral Q1400  . methenamine  1,000 mg Oral BID  . sodium chloride  3 mL Intravenous Q12H  . vitamin C  1,000 mg Oral Daily  . DISCONTD: amLODipine  5 mg Oral Daily  . DISCONTD: cefTRIAXone (ROCEPHIN)  IV  1 g  Intravenous Q24H  . DISCONTD: cloNIDine  0.1 mg Oral BID  . DISCONTD: hydrALAZINE  10 mg Oral TID  . DISCONTD: hydrALAZINE  25 mg Oral TID   Continuous Infusions:    . sodium chloride 100 mL/hr at 08/05/11 0634   PRN Meds:.acetaminophen, acetaminophen, ALPRAZolam, hydrALAZINE, HYDROcodone-acetaminophen, ondansetron (ZOFRAN) IV, ondansetron  Assessment/Plan: 1. Near syncope/Orthostatic hypotension - seems to be having a higher BP since admission  2. End-stage MYELODYSPLASTIC SYNDROME transfusion dependent with severe thrombocytopenia and neutropenia - transfuse 2 units PRBCs- await todays CBC Interms of her MDS, last transfusion was on 3/1, per Dr.Shadad transfuse platelets only for active bleeding- requiring weekly transfusions   3. HTN- D/C  clonodine, increase hydralazine and add norvasc- will also check blood pressure in leg a patient tenses up when cuff is on  4. KIDNEY DISEASE, CHRONIC stage   5. RENAL CELL CANCER   6. GNR UTI - kleb- levaquin day #2 (total of 4 days of abx)  7. delerium- waxes and wanes- seems to be worse in the AM   Not a candidate for LTAC so plan on SNF  Await CBC    LOS: 4 days  Skarlette Lattner, DO 08/06/2011, 8:27 AM

## 2011-08-06 NOTE — Progress Notes (Signed)
   CARE MANAGEMENT NOTE 08/06/2011  Patient:  [name], [name]   Account Number:  [id number]  Date Initiated:  08/03/2011  Documentation initiated by:  Junius Creamer  Subjective/Objective Assessment:   adm w near syncope     Action/Plan:   lives alone, pcp dr Smitty Cords swords   Anticipated DC Date:  08/06/2011   Anticipated DC Plan:  HOME W HOME HEALTH SERVICES  In-house referral  Hospice / Palliative Care  Clinical Social Worker      DC Planning Services  CM consult      [facility] Choice  HOSPICE   Choice offered to / List presented to:  C-1 Patient   DME arranged  3-N-1      DME agency  [facility]     [facility] arranged  HH-1 RN  HH-6 SOCIAL WORKER      HH agency  HOSPICE   Status of service:   Medicare Important Message given?   (If response is "NO", the following Medicare IM given date fields will be blank) Date Medicare IM given:   Date Additional Medicare IM given:    Discharge Disposition:  HOME W HOSPICE CARE  Per UR Regulation:    Comments:  08/06/11- 1215- Donn Pierini RN, BSN [phone] Heard from [place] with [facility]- pt is not appropriate for LTAC at this time- notified CSW that we will need to look into SNF placement. MD notified of LTAC determination.  08/06/11 9:27 Letha Cape RN, BSN [phone] spoke with Boneta Lucks with [facility], she is looking at patient this am and wil let me know as soon as she is done if patient is appropriate for [facility].  3/5 spoke w pt and friend phil. she would like to speak w sw about adv directives. sw ref made. went over list of hospice agencies and she chose [facility]. ref faxed and alerted [facility] w hospice of new referral. copy of hospice hhc agency list will be placed on chart. pt has 1 son but they have not been close and she relies on phil for assist at home. debbie dowell rn,bsn [phone]  3/4 debbie dowell rn,bsn [phone]

## 2011-08-07 ENCOUNTER — Other Ambulatory Visit: Payer: Self-pay | Admitting: Oncology

## 2011-08-07 ENCOUNTER — Telehealth: Payer: Self-pay | Admitting: Oncology

## 2011-08-07 DIAGNOSIS — D469 Myelodysplastic syndrome, unspecified: Secondary | ICD-10-CM

## 2011-08-07 LAB — CBC
Hemoglobin: 9.1 g/dL — ABNORMAL LOW (ref 12.0–15.0)
MCH: 30 pg (ref 26.0–34.0)
MCHC: 35.4 g/dL (ref 30.0–36.0)
Platelets: <5 10*3/uL — CL (ref 150–400)
RDW: 15.9 % — ABNORMAL HIGH (ref 11.5–15.5)

## 2011-08-07 LAB — BASIC METABOLIC PANEL
Calcium: 9.1 mg/dL (ref 8.4–10.5)
GFR calc Af Amer: 26 mL/min — ABNORMAL LOW (ref >90)
GFR calc non Af Amer: 22 mL/min — ABNORMAL LOW (ref >90)
Glucose, Bld: 112 mg/dL — ABNORMAL HIGH (ref 70–99)
Potassium: 4.4 mEq/L (ref 3.5–5.1)
Sodium: 142 mEq/L (ref 135–145)

## 2011-08-07 MED ORDER — ALPRAZOLAM 0.5 MG PO TABS: 0.5000 mg | ORAL_TABLET | Freq: Every evening | ORAL | Status: DC | PRN

## 2011-08-07 MED ORDER — AMLODIPINE BESYLATE 10 MG PO TABS: 10.0000 mg | ORAL_TABLET | Freq: Every day | ORAL | Status: DC

## 2011-08-07 MED ORDER — HYDRALAZINE HCL 50 MG PO TABS: 50.0000 mg | ORAL_TABLET | Freq: Three times a day (TID) | ORAL | Status: DC

## 2011-08-07 MED ORDER — HYDROCODONE-ACETAMINOPHEN 5-325 MG PO TABS: 1.0000 | ORAL_TABLET | Freq: Four times a day (QID) | ORAL | Status: AC | PRN

## 2011-08-07 NOTE — Telephone Encounter (Signed)
Per 3/8 pof lb/FS/PRBS's 3/20. Pt already has lb/PRBC's 3/15. Per dixie leave 3/15 lb/blood and schedule only lb/fu for .3

## 2011-08-07 NOTE — Progress Notes (Signed)
Pt to d/c to [facility] today by ambulance.  Family aware and helping with discharge.

## 2011-08-07 NOTE — Progress Notes (Signed)
Physical Therapy Treatment Patient Details Name: [name] MRN: [id number] DOB: 1925/07/18 Today's Date: 08/07/2011  PT Assessment/Plan  PT - Assessment/Plan Comments on Treatment Session: pt was eager to work with therapies today and worked on scanning, speed changes and turns as well as activity tolerance.  Pt should do well at [facility] rehab. PT Plan: Discharge plan remains appropriate;Discharge plan needs to be updated Follow Up Recommendations: Skilled nursing facility Equipment Recommended: None recommended by PT;Defer to next venue PT Goals  Acute Rehab PT Goals PT Goal: Sit to Stand - Progress: Progressing toward goal PT Goal: Stand to Sit - Progress: Progressing toward goal PT Transfer Goal: Bed to Chair/Chair to Bed - Progress: Progressing toward goal PT Goal: Ambulate - Progress: Progressing toward goal PT Goal: Up/Down Stairs - Progress: Not met  PT Treatment Precautions/Restrictions  Precautions Precautions: Fall Restrictions Weight Bearing Restrictions: No Mobility (including Balance) Bed Mobility Bed Mobility: No Transfers Transfers: Yes Sit to Stand: From chair/3-in-1;Other (comment);With upper extremity assist;With armrests (min guard A) Sit to Stand Details (indicate cue type and reason): vc for hand placement; guarding for safeth Stand to Sit: To chair/3-in-1;Other (comment) (min guard assist) Stand to Sit Details: vc's to reach back to armrests Ambulation/Gait Ambulation/Gait: Yes Ambulation/Gait Assistance: Other (comment) (min guard A) Ambulation/Gait Assistance Details (indicate cue type and reason): mildly unsteady with wandering and variable safety with RW Ambulation Distance (Feet): 360 Feet Assistive device: Rolling walker Gait Pattern: Step-through pattern;Decreased step length - right;Decreased step length - left;Decreased stride length;Right hip hike Stairs: No  Posture/Postural Control Posture/Postural Control: No significant limitations Exercise     End of Session PT - End of Session Activity Tolerance: Patient tolerated treatment well Patient left: in chair;with family/visitor present;with call bell in reach Nurse Communication: Mobility status for transfers;Mobility status for ambulation General Behavior During Session: [facility] for tasks performed Cognition: [facility] for tasks performed  Arin Vanosdol, Eliseo Gum 08/07/2011, 3:10 PM  08/07/2011  Bayard Bing, PT [phone] (pager)

## 2011-08-07 NOTE — Telephone Encounter (Signed)
Per dixie pt is now @ [facility]. Called [facility] [phone]) and s/w [facility] re appts for 3/15 and 3/20. Per 3/8 order pt to have lb/fu/blood 3/20. Pt already has appt for lb/blood 3/15. Per dixie leave appt for 3/15 and schedule only lb/fu for 3/20.

## 2011-08-07 NOTE — Progress Notes (Signed)
D/c orders received; IV removed with gauze on; pt remains in stable condition; copy of pt meds and instructions sent with pt to take to facility; pt transported to SNF via ambulance

## 2011-08-07 NOTE — Discharge Summary (Addendum)
Discharge Summary  [name] MR#: [id number]  DOB:1925/12/18  Date of Admission: 08/02/2011 Date of Discharge: 08/07/2011  Patient's PCP: Judie Petit, MD, MD  Attending Physician:Quinesha Selinger  Consults: Treatment Team:  Benjiman Core, MD [facility]   Discharge Diagnoses: Principal Problem:  *Near syncope Active Problems:  MYELODYSPLASTIC SYNDROME  HYPERTENSION  KIDNEY DISEASE, CHRONIC NOS  RENAL CELL CANCER  Fall  Weakness   Brief Admitting History and Physical 78 year-old female with history of myelodysplastic syndrome, chronic kidney disease, hypertension, renal cell carcinoma status post ablation therapy who has had a recent transfusion because of low hemoglobin on March 1 was brought to the ER because patient had a fall at her home and was found on the floor by her friend who brought her to the ER. Patient states she was trying to go to the bathroom when she fell and does not recall what made her fall but states she did not loose consciousness. In addition patient has been experiencing some abdominal pain on the suprapubic area last 3-4 days with no nausea vomiting or diarrhea. In the ER patient had a CT head maxillofacial area C-spine abdomen and pelvis all of which does not show any acute. Patient will be admitted for further observation. Patient lives alone. Patient at this time denies any chest pain shortness of breath nausea vomiting fever or chills.   Discharge Medications Medication List  As of 08/07/2011  2:51 PM   STOP taking these medications         HYDROcodone-acetaminophen 5-500 MG per tablet         TAKE these medications         ALPRAZolam 0.5 MG tablet   Commonly known as: XANAX   Take 1 tablet (0.5 mg total) by mouth at bedtime as needed.      amLODipine 10 MG tablet   Commonly known as: NORVASC   Take 1 tablet (10 mg total) by mouth daily.      bisoprolol 10 MG tablet   Commonly known as: ZEBETA   Take 10 mg by mouth daily.      Cranberry 500 MG Caps   Take 1 capsule by mouth daily.      famotidine 40 MG tablet   Commonly known as: PEPCID   Take 1 tablet (40 mg total) by mouth daily.      felodipine 10 MG 24 hr tablet   Commonly known as: PLENDIL   Take 1 tablet (10 mg total) by mouth daily.      folic acid 1 MG tablet   Commonly known as: FOLVITE   Take 1 tablet (1 mg total) by mouth daily.      hydrALAZINE 50 MG tablet   Commonly known as: APRESOLINE   Take 1 tablet (50 mg total) by mouth 3 (three) times daily.      HYDROcodone-acetaminophen 5-325 MG per tablet   Commonly known as: NORCO   Take 1 tablet by mouth every 6 (six) hours as needed.      methenamine 1 GM tablet   Commonly known as: MANDELAMINE   Take 1,000 mg by mouth Twice daily.      ULORIC 40 MG tablet   Generic drug: febuxostat   Take 1 tablet by mouth daily.      vitamin C 1000 MG tablet   Take 1,000 mg by mouth daily.            Hospital Course: 1. Near syncope/Orthostatic hypotension - no further episodes  2. End-stage MYELODYSPLASTIC SYNDROME transfusion dependent with severe thrombocytopenia and neutropenia - transfuse 2 units PRBCs-  Interms of her MDS, last transfusion as an outpatient was on 3/1, per Dr.Shadad transfuse platelets only for active bleeding- requiring weekly transfusions for Hgb < 7 and symptomatic Poor overall prognosis  3. HTN- D/C clonodine, increase hydralazine and add norvasc- will need medication adjustment  4. KIDNEY DISEASE, CHRONIC stage - stable  5. RENAL CELL CANCER   6. GNR UTI - kleb- treated with levaquin  7. delerium/sundowning- waxes and wanes- seems to be worse in the AM after waking up and evening      Day of Discharge BP 119/56  Pulse 60  Temp(Src) 97.3 F (36.3 C) (Oral)  Resp 18  Ht 5\' 6"  (1.676 m)  Wt 63 kg (138 lb 14.2 oz)  BMI 22.42 kg/m2  SpO2 98% Pleasant and cooperative RRR, no murmur Clear ant Multiple areas of bruising  Results for orders placed  during the hospital encounter of 08/02/11 (from the past 48 hour(s))  CBC     Status: Abnormal   Collection Time   08/06/11  8:48 AM      Component Value Range Comment   WBC 0.9 (*) 4.0 - 10.5 (K/uL) CRITICAL VALUE NOTED.  VALUE IS CONSISTENT WITH PREVIOUSLY REPORTED AND CALLED VALUE.   RBC 3.37 (*) 3.87 - 5.11 (MIL/uL)    Hemoglobin 10.0 (*) 12.0 - 15.0 (g/dL)    HCT 29.5 (*) 62.1 - 46.0 (%)    MCV 84.6  78.0 - 100.0 (fL)    MCH 29.7  26.0 - 34.0 (pg)    MCHC 35.1  30.0 - 36.0 (g/dL)    RDW 30.8 (*) 65.7 - 15.5 (%)    Platelets <5 (*) 150 - 400 (K/uL) CRITICAL VALUE NOTED.  VALUE IS CONSISTENT WITH PREVIOUSLY REPORTED AND CALLED VALUE.  CBC     Status: Abnormal   Collection Time   08/07/11 11:00 AM      Component Value Range Comment   WBC 0.9 (*) 4.0 - 10.5 (K/uL) CRITICAL VALUE NOTED.  VALUE IS CONSISTENT WITH PREVIOUSLY REPORTED AND CALLED VALUE.   RBC 3.03 (*) 3.87 - 5.11 (MIL/uL)    Hemoglobin 9.1 (*) 12.0 - 15.0 (g/dL)    HCT 84.6 (*) 96.2 - 46.0 (%)    MCV 84.8  78.0 - 100.0 (fL)    MCH 30.0  26.0 - 34.0 (pg)    MCHC 35.4  30.0 - 36.0 (g/dL)    RDW 95.2 (*) 84.1 - 15.5 (%)    Platelets <5 (*) 150 - 400 (K/uL) CRITICAL VALUE NOTED.  VALUE IS CONSISTENT WITH PREVIOUSLY REPORTED AND CALLED VALUE.  BASIC METABOLIC PANEL     Status: Abnormal   Collection Time   08/07/11 11:00 AM      Component Value Range Comment   Sodium 142  135 - 145 (mEq/L)    Potassium 4.4  3.5 - 5.1 (mEq/L)    Chloride 116 (*) 96 - 112 (mEq/L)    CO2 18 (*) 19 - 32 (mEq/L)    Glucose, Bld 112 (*) 70 - 99 (mg/dL)    BUN 37 (*) 6 - 23 (mg/dL)    Creatinine, Ser 3.24 (*) 0.50 - 1.10 (mg/dL)    Calcium 9.1  8.4 - 10.5 (mg/dL)    GFR calc non Af Amer 22 (*) >90 (mL/min)    GFR calc Af Amer 26 (*) >90 (mL/min)     Ct Abdomen Pelvis Wo Contrast  08/03/2011  *RADIOLOGY REPORT*  Clinical Data:  Weakness, dizziness, fall, abdominal pain, nausea, vomiting, past history hypertension, renal cell carcinoma post  surgery, CHF, myelodysplastic syndrome  CT ABDOMEN AND PELVIS WITHOUT CONTRAST  Technique:  Multidetector CT imaging of the abdomen and pelvis was performed following the standard protocol without intravenous contrast. Sagittal and coronal MPR images reconstructed from axial data set.  Comparison: 03/11/2011  Findings: Low attenuation of circulating blood consistent with anemia. Minimal bibasilar atelectasis. Extensive atherosclerotic calcification including dense bilateral renal artery calcification. Radiodense liver with small hepatic cysts. Herniation on the right kidney through posterior right abdominal wall fascial planes, question incisional hernia.  Within limits of a nonenhanced exam, no additional focal abnormalities of the liver, spleen, pancreas, kidneys, or adrenal glands. Diverticulosis of the sigmoid colon without evidence of diverticulitis. Beam hardening artifacts from right hip prosthesis obscure portions of pelvis. Stomach decompressed. Bowel loops otherwise normal appearance.  No mass, adenopathy, free fluid or inflammatory process. Air within urinary bladder question prior instrumentation. Diffuse osseous demineralization with degenerative disc disease changes lumbar spine.  IMPRESSION: Posterior right flank hernia with partial herniation of right kidney. Sigmoid diverticulosis without evidence of diverticulitis. Extensive atherosclerotic disease including bilateral renal arteries. Anemia. Dense liver, which can be seen with amiodarone therapy, prior Thoratrast or gold colloid administration, hemochromatosis, and certain deposition diseases.  Original Report Authenticated By: Lollie Marrow, M.D.   Dg Chest 2 View  08/02/2011  *RADIOLOGY REPORT*  Clinical Data: Fall today.  Right chest pain with bruising. History of hypertension and congestive heart failure.  CHEST - 2 VIEW  Comparison: 03/10/2011.  Findings: The heart size and mediastinal contours are stable with aortic atherosclerosis.  There  is stable chronic linear scarring in the right perihilar region and chronic central airway thickening. No edema, confluent airspace opacity or pleural effusion is identified.  The subacromial space of the right shoulder is narrowed. No acute osseous findings are seen.  IMPRESSION: Stable chronic lung disease.  No acute cardiopulmonary process.  Original Report Authenticated By: Gerrianne Scale, M.D.   Dg Shoulder Right  08/02/2011  *RADIOLOGY REPORT*  Clinical Data: Pain and bruising secondary to a fall.  RIGHT SHOULDER - 2+ VIEW  Comparison: 02/13/2006  Findings: There is no fracture or dislocation or other acute osseous abnormality.  Narrowing of the subacromial space is consistent with a chronic rotator cuff tear.  Chronic scarring or atelectasis in the right midzone, unchanged since chest x-ray of 05/10/2011.  IMPRESSION: No acute osseous abnormality of the right shoulder.  Probable chronic rotator cuff tear.  Original Report Authenticated By: Gwynn Burly, M.D.   Ct Head Wo Contrast  08/02/2011  *RADIOLOGY REPORT*  Clinical Data:  Dizziness and weakness, fall, bruising in the right eye.  CT HEAD WITHOUT CONTRAST CT CERVICAL SPINE WITHOUT CONTRAST  Technique:  Multidetector CT imaging of the head and cervical spine was performed following the standard protocol without intravenous contrast.  Multiplanar CT image reconstructions of the cervical spine were also generated.  Comparison:  Head CT 08/09/2009  CT HEAD  Findings: No acute intracranial hemorrhage.  No focal mass lesion. No CT evidence of acute infarction.   No midline shift or mass effect.  No hydrocephalus.  Basilar cisterns are patent.  There is generalized cortical atrophy and periventricular white matter hypodensities which are similar to prior.  Paranasal sinuses and mastoid air cells are clear.  Orbits are normal.  IMPRESSION:  1.  No acute intracranial findings. 2.  Atrophy and microvascular  disease are unchanged from prior.  CT CERVICAL  SPINE  Findings: No prevertebral soft tissue swelling.  There is normal alignment of the  cervical vertebral bodies.  There are multiple levels of loss of disc space which is felt to be degenerative in nature.  Normal facet articulation.  There is facet hypertrophy present.  Normal craniocervical junction.  No evidence of epidural paraspinal hematoma.  There is mild pleural parenchymal thickening at the left lung apex. This appears chronic on comparison chest radiographs.  IMPRESSION:  1.  No evidence of cervical spine fracture.  2.  Multilevel disc osteophytic disease. 3.  Chronic pleural parenchymal thickening at the left lung apex.  Original Report Authenticated By: Genevive Bi, M.D.   Ct Cervical Spine Wo Contrast  08/02/2011  *RADIOLOGY REPORT*  Clinical Data:  Dizziness and weakness, fall, bruising in the right eye.  CT HEAD WITHOUT CONTRAST CT CERVICAL SPINE WITHOUT CONTRAST  Technique:  Multidetector CT imaging of the head and cervical spine was performed following the standard protocol without intravenous contrast.  Multiplanar CT image reconstructions of the cervical spine were also generated.  Comparison:  Head CT 08/09/2009  CT HEAD  Findings: No acute intracranial hemorrhage.  No focal mass lesion. No CT evidence of acute infarction.   No midline shift or mass effect.  No hydrocephalus.  Basilar cisterns are patent.  There is generalized cortical atrophy and periventricular white matter hypodensities which are similar to prior.  Paranasal sinuses and mastoid air cells are clear.  Orbits are normal.  IMPRESSION:  1.  No acute intracranial findings. 2.  Atrophy and microvascular disease are unchanged from prior.  CT CERVICAL SPINE  Findings: No prevertebral soft tissue swelling.  There is normal alignment of the  cervical vertebral bodies.  There are multiple levels of loss of disc space which is felt to be degenerative in nature.  Normal facet articulation.  There is facet hypertrophy present.   Normal craniocervical junction.  No evidence of epidural paraspinal hematoma.  There is mild pleural parenchymal thickening at the left lung apex. This appears chronic on comparison chest radiographs.  IMPRESSION:  1.  No evidence of cervical spine fracture.  2.  Multilevel disc osteophytic disease. 3.  Chronic pleural parenchymal thickening at the left lung apex.  Original Report Authenticated By: Genevive Bi, M.D.   Ct Maxillofacial Wo Cm  08/02/2011  *RADIOLOGY REPORT*  Clinical Data: Right sided face pain and bruising secondary to a fall.  Confusion.  CT MAXILLOFACIAL WITHOUT CONTRAST  Technique:  Multidetector CT imaging of the maxillofacial structures was performed. Multiplanar CT image reconstructions were also generated.  Comparison: None.  Findings: There is slight soft tissue swelling around the right orbit.  There is no fracture or sinus opacification.  IMPRESSION: Soft tissue swelling around the right orbit.  Otherwise, normal.  Original Report Authenticated By: Gwynn Burly, M.D.     Disposition: SNF  Diet: heart healthy  Activity: as tolerated   Follow-up Appts: Discharge Orders    Future Appointments: Provider: Department: Dept Phone: Center:   08/14/2011 9:00 AM Sherrie Mustache [facility] [phone] None   08/14/2011 10:00 AM [facility] [phone] None   08/28/2011 9:00 AM Sherrie Mustache [facility] [phone] None   08/28/2011 10:00 AM [facility] [phone] None   09/11/2011 9:00 AM Krista Blue [facility] [phone] None   09/11/2011 10:00 AM Benjiman Core, MD [facility] [phone] None   09/11/2011 11:00 AM [facility] [phone] None     Future Orders Please Complete By Expires   Diet - low sodium heart healthy      Increase activity slowly      Discharge instructions      Comments:   Fall risk CBCs and transfusions per Dr. Clelia Croft       Time spent on discharge, talking to  the patient, and coordinating care: 63 mins.   SignedMarlin Canary, DO 08/07/2011, 2:51 PM

## 2011-08-14 ENCOUNTER — Other Ambulatory Visit (HOSPITAL_BASED_OUTPATIENT_CLINIC_OR_DEPARTMENT_OTHER): Payer: Medicare Other | Admitting: Lab

## 2011-08-14 ENCOUNTER — Other Ambulatory Visit: Payer: Self-pay | Admitting: Oncology

## 2011-08-14 ENCOUNTER — Ambulatory Visit (HOSPITAL_BASED_OUTPATIENT_CLINIC_OR_DEPARTMENT_OTHER): Payer: Medicare Other

## 2011-08-14 VITALS — BP 120/54 | HR 58 | Temp 96.8°F | Resp 18

## 2011-08-14 DIAGNOSIS — E785 Hyperlipidemia, unspecified: Secondary | ICD-10-CM

## 2011-08-14 DIAGNOSIS — I6350 Cerebral infarction due to unspecified occlusion or stenosis of unspecified cerebral artery: Secondary | ICD-10-CM

## 2011-08-14 DIAGNOSIS — H409 Unspecified glaucoma: Secondary | ICD-10-CM

## 2011-08-14 DIAGNOSIS — M109 Gout, unspecified: Secondary | ICD-10-CM

## 2011-08-14 DIAGNOSIS — I10 Essential (primary) hypertension: Secondary | ICD-10-CM

## 2011-08-14 DIAGNOSIS — D649 Anemia, unspecified: Secondary | ICD-10-CM

## 2011-08-14 DIAGNOSIS — D469 Myelodysplastic syndrome, unspecified: Secondary | ICD-10-CM

## 2011-08-14 DIAGNOSIS — M810 Age-related osteoporosis without current pathological fracture: Secondary | ICD-10-CM

## 2011-08-14 DIAGNOSIS — C649 Malignant neoplasm of unspecified kidney, except renal pelvis: Secondary | ICD-10-CM

## 2011-08-14 DIAGNOSIS — F341 Dysthymic disorder: Secondary | ICD-10-CM

## 2011-08-14 DIAGNOSIS — K449 Diaphragmatic hernia without obstruction or gangrene: Secondary | ICD-10-CM

## 2011-08-14 DIAGNOSIS — I5020 Unspecified systolic (congestive) heart failure: Secondary | ICD-10-CM

## 2011-08-14 DIAGNOSIS — Z86010 Personal history of colonic polyps: Secondary | ICD-10-CM

## 2011-08-14 DIAGNOSIS — K5730 Diverticulosis of large intestine without perforation or abscess without bleeding: Secondary | ICD-10-CM

## 2011-08-14 DIAGNOSIS — N189 Chronic kidney disease, unspecified: Secondary | ICD-10-CM

## 2011-08-14 DIAGNOSIS — Z8719 Personal history of other diseases of the digestive system: Secondary | ICD-10-CM

## 2011-08-14 DIAGNOSIS — K219 Gastro-esophageal reflux disease without esophagitis: Secondary | ICD-10-CM

## 2011-08-14 LAB — TECHNOLOGIST REVIEW

## 2011-08-14 LAB — CBC WITH DIFFERENTIAL/PLATELET
Basophils Absolute: 0 10*3/uL (ref 0.0–0.1)
Eosinophils Absolute: 0.1 10*3/uL (ref 0.0–0.5)
HGB: 8.1 g/dL — ABNORMAL LOW (ref 11.6–15.9)
MCV: 88.1 fL (ref 79.5–101.0)
MONO#: 0 10*3/uL — ABNORMAL LOW (ref 0.1–0.9)
MONO%: 0.3 % (ref 0.0–14.0)
NEUT#: 0.3 10*3/uL — CL (ref 1.5–6.5)
RBC: 2.62 10*6/uL — ABNORMAL LOW (ref 3.70–5.45)
RDW: 16.8 % — ABNORMAL HIGH (ref 11.2–14.5)
WBC: 0.9 10*3/uL — CL (ref 3.9–10.3)
lymph#: 0.5 10*3/uL — ABNORMAL LOW (ref 0.9–3.3)

## 2011-08-14 LAB — COMPREHENSIVE METABOLIC PANEL
ALT: 10 U/L (ref 0–35)
Albumin: 3.2 g/dL — ABNORMAL LOW (ref 3.5–5.2)
Alkaline Phosphatase: 130 U/L — ABNORMAL HIGH (ref 39–117)
CO2: 18 mEq/L — ABNORMAL LOW (ref 19–32)
Glucose, Bld: 103 mg/dL — ABNORMAL HIGH (ref 70–99)
Potassium: 4.5 mEq/L (ref 3.5–5.3)
Sodium: 143 mEq/L (ref 135–145)
Total Bilirubin: 0.5 mg/dL (ref 0.3–1.2)
Total Protein: 5.4 g/dL — ABNORMAL LOW (ref 6.0–8.3)

## 2011-08-14 LAB — HOLD TUBE, BLOOD BANK

## 2011-08-14 MED ORDER — DIPHENHYDRAMINE HCL 25 MG PO CAPS
25.0000 mg | ORAL_CAPSULE | Freq: Once | ORAL | Status: AC
  Administered 2011-08-14: 25 mg via ORAL

## 2011-08-14 MED ORDER — ACETAMINOPHEN 325 MG PO TABS
650.0000 mg | ORAL_TABLET | Freq: Once | ORAL | Status: AC
  Administered 2011-08-14: 650 mg via ORAL

## 2011-08-14 MED ORDER — SODIUM CHLORIDE 0.9 % IV SOLN
250.0000 mL | Freq: Once | INTRAVENOUS | Status: AC
  Administered 2011-08-14: 250 mL via INTRAVENOUS

## 2011-08-14 NOTE — Patient Instructions (Signed)
Blood Transfusion   A blood transfusion replaces your blood or some of its parts. Blood is replaced when you have lost blood because of surgery, an accident, or for severe blood conditions like anemia.  You can donate blood to be used on yourself if you have a planned surgery. If you lose blood during that surgery, your own blood can be given back to you.  Any blood given to you is checked to make sure it matches your blood type. Your temperature, blood pressure, and heart rate (vital signs) will be checked often.   GET HELP RIGHT AWAY IF:    You feel sick to your stomach (nauseous) or throw up (vomit).   You have watery poop (diarrhea).   You have shortness of breath or trouble breathing.   You have blood in your pee (urine) or have dark colored pee.   You have chest pain or tightness.   Your eyes or skin turn yellow (jaundice).   You have a temperature by mouth above 102 F (38.9 C), not controlled by medicine.   You start to shake and have chills.   You develop a a red rash (hives) or feel itchy.   You develop lightheadedness or feel confused.   You develop back, joint, or muscle pain.   You do not feel hungry (lost appetite).   You feel tired, restless, or nervous.   You develop belly (abdominal) cramps.  Document Released: 08/14/2008 Document Revised: 05/07/2011 Document Reviewed: 08/14/2008  [employer] Patient Information 2012 [employer].

## 2011-08-15 LAB — TYPE AND SCREEN
ABO/RH(D): O NEG
Antibody Screen: NEGATIVE
Unit division: 0

## 2011-08-19 ENCOUNTER — Ambulatory Visit: Payer: Medicare Other | Admitting: Oncology

## 2011-08-19 ENCOUNTER — Other Ambulatory Visit: Payer: Medicare Other | Admitting: Lab

## 2011-08-28 ENCOUNTER — Other Ambulatory Visit (HOSPITAL_BASED_OUTPATIENT_CLINIC_OR_DEPARTMENT_OTHER): Payer: Medicare Other | Admitting: Lab

## 2011-08-28 ENCOUNTER — Other Ambulatory Visit: Payer: Self-pay | Admitting: Oncology

## 2011-08-28 ENCOUNTER — Ambulatory Visit (HOSPITAL_BASED_OUTPATIENT_CLINIC_OR_DEPARTMENT_OTHER): Payer: Medicare Other

## 2011-08-28 VITALS — BP 163/76 | HR 57 | Temp 97.8°F | Resp 18

## 2011-08-28 DIAGNOSIS — D649 Anemia, unspecified: Secondary | ICD-10-CM

## 2011-08-28 DIAGNOSIS — M109 Gout, unspecified: Secondary | ICD-10-CM

## 2011-08-28 DIAGNOSIS — I10 Essential (primary) hypertension: Secondary | ICD-10-CM

## 2011-08-28 DIAGNOSIS — C649 Malignant neoplasm of unspecified kidney, except renal pelvis: Secondary | ICD-10-CM

## 2011-08-28 DIAGNOSIS — D469 Myelodysplastic syndrome, unspecified: Secondary | ICD-10-CM

## 2011-08-28 DIAGNOSIS — E785 Hyperlipidemia, unspecified: Secondary | ICD-10-CM

## 2011-08-28 DIAGNOSIS — I6350 Cerebral infarction due to unspecified occlusion or stenosis of unspecified cerebral artery: Secondary | ICD-10-CM

## 2011-08-28 DIAGNOSIS — K5730 Diverticulosis of large intestine without perforation or abscess without bleeding: Secondary | ICD-10-CM

## 2011-08-28 DIAGNOSIS — N189 Chronic kidney disease, unspecified: Secondary | ICD-10-CM

## 2011-08-28 DIAGNOSIS — Z86010 Personal history of colonic polyps: Secondary | ICD-10-CM

## 2011-08-28 DIAGNOSIS — D61818 Other pancytopenia: Secondary | ICD-10-CM

## 2011-08-28 DIAGNOSIS — K449 Diaphragmatic hernia without obstruction or gangrene: Secondary | ICD-10-CM

## 2011-08-28 DIAGNOSIS — M810 Age-related osteoporosis without current pathological fracture: Secondary | ICD-10-CM

## 2011-08-28 DIAGNOSIS — H409 Unspecified glaucoma: Secondary | ICD-10-CM

## 2011-08-28 DIAGNOSIS — K219 Gastro-esophageal reflux disease without esophagitis: Secondary | ICD-10-CM

## 2011-08-28 DIAGNOSIS — Z8719 Personal history of other diseases of the digestive system: Secondary | ICD-10-CM

## 2011-08-28 DIAGNOSIS — I5020 Unspecified systolic (congestive) heart failure: Secondary | ICD-10-CM

## 2011-08-28 DIAGNOSIS — F341 Dysthymic disorder: Secondary | ICD-10-CM

## 2011-08-28 LAB — CBC WITH DIFFERENTIAL/PLATELET
BASO%: 0.5 % (ref 0.0–2.0)
Basophils Absolute: 0 10*3/uL (ref 0.0–0.1)
EOS%: 4.8 % (ref 0.0–7.0)
HCT: 23 % — ABNORMAL LOW (ref 34.8–46.6)
HGB: 7.8 g/dL — ABNORMAL LOW (ref 11.6–15.9)
MCHC: 34 g/dL (ref 31.5–36.0)
MONO#: 0 10*3/uL — ABNORMAL LOW (ref 0.1–0.9)
NEUT%: 30.2 % — ABNORMAL LOW (ref 38.4–76.8)
RDW: 16.1 % — ABNORMAL HIGH (ref 11.2–14.5)
WBC: 0.9 10*3/uL — CL (ref 3.9–10.3)
lymph#: 0.6 10*3/uL — ABNORMAL LOW (ref 0.9–3.3)

## 2011-08-28 LAB — HOLD TUBE, BLOOD BANK

## 2011-08-28 LAB — PREPARE RBC (CROSSMATCH)

## 2011-08-28 MED ORDER — ACETAMINOPHEN 325 MG PO TABS
650.0000 mg | ORAL_TABLET | Freq: Once | ORAL | Status: AC
  Administered 2011-08-28: 650 mg via ORAL

## 2011-08-28 MED ORDER — SODIUM CHLORIDE 0.9 % IV SOLN
250.0000 mL | Freq: Once | INTRAVENOUS | Status: AC
  Administered 2011-08-28: 250 mL via INTRAVENOUS

## 2011-08-28 MED ORDER — DIPHENHYDRAMINE HCL 25 MG PO CAPS
25.0000 mg | ORAL_CAPSULE | Freq: Once | ORAL | Status: AC
  Administered 2011-08-28: 25 mg via ORAL

## 2011-08-29 LAB — TYPE AND SCREEN: Unit division: 0

## 2011-08-31 ENCOUNTER — Telehealth: Payer: Self-pay | Admitting: *Deleted

## 2011-08-31 NOTE — Telephone Encounter (Signed)
Received call from son [name] wanting to know pt's progress from last office visit with Dr. Clelia Croft.   Informed [name] that message will be relayed to md for 09/01/11. [name]'s   Phone       [phone].

## 2011-09-01 ENCOUNTER — Encounter (HOSPITAL_COMMUNITY)
Admission: RE | Admit: 2011-09-01 | Discharge: 2011-09-01 | Disposition: A | Discharge status: Home / Self Care | Payer: Medicare Other | Source: Ambulatory Visit | Attending: Medical | Admitting: Medical

## 2011-09-01 DIAGNOSIS — D649 Anemia, unspecified: Secondary | ICD-10-CM | POA: Insufficient documentation

## 2011-09-01 DIAGNOSIS — D469 Myelodysplastic syndrome, unspecified: Secondary | ICD-10-CM

## 2011-09-02 ENCOUNTER — Encounter (HOSPITAL_COMMUNITY): Payer: Self-pay | Admitting: Emergency Medicine

## 2011-09-02 ENCOUNTER — Emergency Department (HOSPITAL_COMMUNITY): Payer: Medicare Other

## 2011-09-02 ENCOUNTER — Emergency Department (HOSPITAL_COMMUNITY)
Admission: EM | Admit: 2011-09-02 | Discharge: 2011-09-02 | Disposition: A | Discharge status: Skilled Nursing Facility | Payer: Medicare Other | Attending: Emergency Medicine | Admitting: Emergency Medicine

## 2011-09-02 DIAGNOSIS — W06XXXA Fall from bed, initial encounter: Secondary | ICD-10-CM | POA: Insufficient documentation

## 2011-09-02 DIAGNOSIS — I10 Essential (primary) hypertension: Secondary | ICD-10-CM | POA: Insufficient documentation

## 2011-09-02 DIAGNOSIS — Y9210 Unspecified residential institution as the place of occurrence of the external cause: Secondary | ICD-10-CM | POA: Insufficient documentation

## 2011-09-02 DIAGNOSIS — I509 Heart failure, unspecified: Secondary | ICD-10-CM | POA: Insufficient documentation

## 2011-09-02 DIAGNOSIS — M545 Low back pain, unspecified: Secondary | ICD-10-CM | POA: Insufficient documentation

## 2011-09-02 DIAGNOSIS — S7000XA Contusion of unspecified hip, initial encounter: Secondary | ICD-10-CM | POA: Insufficient documentation

## 2011-09-02 DIAGNOSIS — I129 Hypertensive chronic kidney disease with stage 1 through stage 4 chronic kidney disease, or unspecified chronic kidney disease: Secondary | ICD-10-CM | POA: Insufficient documentation

## 2011-09-02 DIAGNOSIS — Z8673 Personal history of transient ischemic attack (TIA), and cerebral infarction without residual deficits: Secondary | ICD-10-CM | POA: Insufficient documentation

## 2011-09-02 DIAGNOSIS — H409 Unspecified glaucoma: Secondary | ICD-10-CM | POA: Insufficient documentation

## 2011-09-02 DIAGNOSIS — S0100XA Unspecified open wound of scalp, initial encounter: Secondary | ICD-10-CM | POA: Insufficient documentation

## 2011-09-02 DIAGNOSIS — W19XXXA Unspecified fall, initial encounter: Secondary | ICD-10-CM

## 2011-09-02 DIAGNOSIS — N189 Chronic kidney disease, unspecified: Secondary | ICD-10-CM | POA: Insufficient documentation

## 2011-09-02 DIAGNOSIS — M25559 Pain in unspecified hip: Secondary | ICD-10-CM | POA: Insufficient documentation

## 2011-09-02 DIAGNOSIS — S7001XA Contusion of right hip, initial encounter: Secondary | ICD-10-CM

## 2011-09-02 MED ORDER — ACETAMINOPHEN 160 MG/5ML PO SOLN
ORAL | Status: AC
  Administered 2011-09-02: 650 mg
  Filled 2011-09-02: qty 20.3

## 2011-09-02 NOTE — ED Notes (Addendum)
Patient left to go to [facility] via [facility] ambulance.

## 2011-09-02 NOTE — ED Notes (Signed)
Report called to [facility] at [facility]. Patient to be returned there by [facility]

## 2011-09-02 NOTE — ED Provider Notes (Signed)
History     CSN: [id number]  Arrival date & time 09/02/11  0415   First MD Initiated Contact with Patient 09/02/11 [id number]      Chief Complaint  Patient presents with  . Fall    patient stated she fell while she tried to get out of the bed to go to bathroom and hit her head. 1cm lac note to rigth posterior part of the head, no active bleeding at this time.      (Consider location/radiation/quality/duration/timing/severity/associated sxs/prior treatment) HPI...accidental fall getting out of bed brief time ago.complains of right hip and low back pain.slight occipital laceration. No loss of consciousness or neuro deficits. Symptoms are minimal. Nothing makes it better or worse.  Past Medical History  Diagnosis Date  . Acquired pancytopenia   . Hypertension   . Depression   . Osteoporosis   . HLD (hyperlipidemia)   . Fracture, hip     bilateral  . CVA (cerebral infarction)   . Chronic kidney disease   . Renal cell carcinoma   . Stricture esophagus   . Glaucoma   . CHF (congestive heart failure)   . Systolic dysfunction   . Raynaud disease   . Hyperhomocysteinemia   . Lymphocytic gastritis   . Anxiety   . DJD (degenerative joint disease)   . Gastric ulcer   . Myelodysplastic syndrome   . Hx of colonic polyps   . Myelodysplastic syndrome   . C. difficile colitis   . Diverticulosis   . Gout   . GERD (gastroesophageal reflux disease)   . Anxiety and depression   . Anemia   . Torn rotator cuff     right shoulder    Past Surgical History  Procedure Date  . Abdominal hysterectomy   . Cataract extraction   . Varicose vein surgery   . Tubal ligation   . Hip fracture surgery      bilateral  . Spine surgery   . Kidney surgery     for renal carcinoma    Family History  Problem Relation Age of Onset  . Breast cancer Sister   . Diabetes Brother   . Colon cancer Neg Hx     History  Substance Use Topics  . Smoking status: Never Smoker   . Smokeless tobacco: Never  Used  . Alcohol Use: No    OB History    Grav Para Term Preterm Abortions TAB SAB Ect Mult Living                  Review of Systems  All other systems reviewed and are negative.    Allergies  Clonidine hydrochloride; Doxazosin mesylate; Doxycycline hyclate; Erythromycin ethylsuccinate; Meperidine hcl; Penicillins; and Sulfonamide derivatives  Home Medications   Current Outpatient Rx  Name Route Sig Dispense Refill  . ALPRAZOLAM 0.5 MG PO TABS Oral Take 1 tablet (0.5 mg total) by mouth at bedtime as needed. 90 tablet 0  . AMLODIPINE BESYLATE 10 MG PO TABS Oral Take 1 tablet (10 mg total) by mouth daily.    Marland Kitchen VITAMIN C 1000 MG PO TABS Oral Take 1,000 mg by mouth daily.    Marland Kitchen BISOPROLOL FUMARATE 10 MG PO TABS Oral Take 10 mg by mouth daily.    Marland Kitchen CRANBERRY 500 MG PO CAPS Oral Take 1 capsule by mouth daily.    Marland Kitchen FAMOTIDINE 40 MG PO TABS Oral Take 1 tablet (40 mg total) by mouth daily. 90 tablet 3  . FELODIPINE ER 10 MG PO  TB24 Oral Take 1 tablet (10 mg total) by mouth daily. 90 tablet 3  . FOLIC ACID 1 MG PO TABS Oral Take 1 tablet (1 mg total) by mouth daily. 90 tablet 1  . HYDRALAZINE HCL 50 MG PO TABS Oral Take 1 tablet (50 mg total) by mouth 3 (three) times daily.    Marland Kitchen HYDROCODONE-ACETAMINOPHEN 5-325 MG PO TABS Oral Take 1 tablet by mouth every 6 (six) hours as needed. For pain    . METHENAMINE MANDELATE 1 G PO TABS Oral Take 1,000 mg by mouth Twice daily.    Marland Kitchen ULORIC 40 MG PO TABS Oral Take 1 tablet by mouth daily.      BP 175/67  Pulse 65  Temp(Src) 97.4 F (36.3 C) (Oral)  Resp 16  SpO2 99%  Physical Exam  Nursing note and vitals reviewed. Constitutional: She is oriented to person, place, and time. She appears well-developed and well-nourished.  HENT:  Head: Normocephalic.       1 cm laceration noted on occipital area  Eyes: Conjunctivae and EOM are normal. Pupils are equal, round, and reactive to light.  Neck: Normal range of motion. Neck supple.  Cardiovascular:  Normal rate and regular rhythm.   Pulmonary/Chest: Effort normal and breath sounds normal.  Abdominal: Soft. Bowel sounds are normal.  Musculoskeletal: Normal range of motion.       Slight tenderness right lateral hip and lower spine  Neurological: She is alert and oriented to person, place, and time.  Skin: Skin is warm and dry. Rash: 1 CM lack noted on the occipital area.  Psychiatric: She has a normal mood and affect.    ED Course  Procedures (including critical care time)  Labs Reviewed - No data to display Dg Lumbar Spine Complete  09/02/2011  *RADIOLOGY REPORT*  Clinical Data: Low back pain after fall.  LUMBAR SPINE - COMPLETE 4+ VIEW  Comparison: CT abdomen and pelvis 08/03/2011  Findings: Lumbar scoliosis convex towards the left.  Degenerative changes throughout the lumbar spine with narrowed interspaces and associated endplate hypertrophic changes.  Compression of the L4 vertebra, stable since previous study.  No abnormal anterior subluxation of cervical vertebrae.  Degenerative changes in the facet joints.  Vascular calcifications.  Postoperative changes in the right hip.  IMPRESSION: Diffuse degenerative changes and scoliosis of the lumbar spine. Old compression deformity of L4.  No acute displaced fractures identified.  Original Report Authenticated By: Marlon Pel, M.D.   Dg Hip Complete Right  09/02/2011  *RADIOLOGY REPORT*  Clinical Data: Right hip pain after fall  RIGHT HIP - COMPLETE 2+ VIEW  Comparison: 01/09/2008  Findings: Postoperative changes with right total hip arthroplasty using cemented femoral component and two screws fixing the acetabular component.  Mild lucency around the bone cement interface is stable since the previous study.  No evidence of acute fracture or subluxation.  Postoperative changes with intramedullary rod and screw fixation of the left hip.  The pelvis, SI joints, and symphysis pubis appear intact.  Vascular calcifications.  Calcified phleboliths.   No significant change since previous forehead.  IMPRESSION: Postoperative changes in both hips.  No evidence of acute fracture or subluxation.  Original Report Authenticated By: Marlon Pel, M.D.     1. Fall   2. Contusion of right hip   3. Low back pain       MDM  Patient is neurologically intact. Plain films of lumbar spine and right hip are negative for acute fracture. Scalp laceration does not  require sutures.        Donnetta Hutching, MD 09/14/11 1626

## 2011-09-02 NOTE — Discharge Instructions (Signed)
X-ray of right hip negative. Lower back films show an old compression fracture of lumbar #4.  Followup your primary care Dr.

## 2011-09-02 NOTE — ED Notes (Signed)
Patient stated she tried to push the bedside table and get out of the bed to go to restroom when she fell and hit her head. Patient stated that her back has been hurting since yesterday. Patient denies LOC when she fell.

## 2011-09-03 ENCOUNTER — Other Ambulatory Visit: Payer: Self-pay | Admitting: *Deleted

## 2011-09-10 ENCOUNTER — Ambulatory Visit: Payer: Medicare Other | Admitting: Internal Medicine

## 2011-09-11 ENCOUNTER — Telehealth: Payer: Self-pay | Admitting: Oncology

## 2011-09-11 ENCOUNTER — Ambulatory Visit (HOSPITAL_BASED_OUTPATIENT_CLINIC_OR_DEPARTMENT_OTHER): Payer: Medicare Other | Admitting: Oncology

## 2011-09-11 ENCOUNTER — Other Ambulatory Visit (HOSPITAL_BASED_OUTPATIENT_CLINIC_OR_DEPARTMENT_OTHER): Payer: Medicare Other | Admitting: Lab

## 2011-09-11 ENCOUNTER — Ambulatory Visit (HOSPITAL_BASED_OUTPATIENT_CLINIC_OR_DEPARTMENT_OTHER): Payer: Medicare Other

## 2011-09-11 VITALS — BP 193/73 | HR 69 | Temp 98.2°F | Resp 18

## 2011-09-11 VITALS — BP 159/58 | HR 66 | Temp 97.2°F | Ht 66.0 in | Wt 127.4 lb

## 2011-09-11 DIAGNOSIS — I10 Essential (primary) hypertension: Secondary | ICD-10-CM

## 2011-09-11 DIAGNOSIS — M810 Age-related osteoporosis without current pathological fracture: Secondary | ICD-10-CM

## 2011-09-11 DIAGNOSIS — D469 Myelodysplastic syndrome, unspecified: Secondary | ICD-10-CM

## 2011-09-11 DIAGNOSIS — Z8719 Personal history of other diseases of the digestive system: Secondary | ICD-10-CM

## 2011-09-11 DIAGNOSIS — E785 Hyperlipidemia, unspecified: Secondary | ICD-10-CM

## 2011-09-11 DIAGNOSIS — Z860100 Personal history of colon polyps, unspecified: Secondary | ICD-10-CM

## 2011-09-11 DIAGNOSIS — D61818 Other pancytopenia: Secondary | ICD-10-CM

## 2011-09-11 DIAGNOSIS — D649 Anemia, unspecified: Secondary | ICD-10-CM

## 2011-09-11 DIAGNOSIS — I5020 Unspecified systolic (congestive) heart failure: Secondary | ICD-10-CM

## 2011-09-11 DIAGNOSIS — K449 Diaphragmatic hernia without obstruction or gangrene: Secondary | ICD-10-CM

## 2011-09-11 DIAGNOSIS — C649 Malignant neoplasm of unspecified kidney, except renal pelvis: Secondary | ICD-10-CM

## 2011-09-11 DIAGNOSIS — R5381 Other malaise: Secondary | ICD-10-CM

## 2011-09-11 DIAGNOSIS — F341 Dysthymic disorder: Secondary | ICD-10-CM

## 2011-09-11 DIAGNOSIS — I6350 Cerebral infarction due to unspecified occlusion or stenosis of unspecified cerebral artery: Secondary | ICD-10-CM

## 2011-09-11 DIAGNOSIS — N189 Chronic kidney disease, unspecified: Secondary | ICD-10-CM

## 2011-09-11 DIAGNOSIS — K219 Gastro-esophageal reflux disease without esophagitis: Secondary | ICD-10-CM

## 2011-09-11 DIAGNOSIS — H409 Unspecified glaucoma: Secondary | ICD-10-CM

## 2011-09-11 DIAGNOSIS — K5730 Diverticulosis of large intestine without perforation or abscess without bleeding: Secondary | ICD-10-CM

## 2011-09-11 DIAGNOSIS — M109 Gout, unspecified: Secondary | ICD-10-CM

## 2011-09-11 LAB — CBC WITH DIFFERENTIAL/PLATELET
Eosinophils Absolute: 0 10*3/uL (ref 0.0–0.5)
MONO#: 0 10*3/uL — ABNORMAL LOW (ref 0.1–0.9)
MONO%: 1.1 % (ref 0.0–14.0)
NEUT#: 0.2 10*3/uL — CL (ref 1.5–6.5)
RBC: 1.99 10*6/uL — ABNORMAL LOW (ref 3.70–5.45)
RDW: 16 % — ABNORMAL HIGH (ref 11.2–14.5)
WBC: 0.9 10*3/uL — CL (ref 3.9–10.3)
lymph#: 0.6 10*3/uL — ABNORMAL LOW (ref 0.9–3.3)
nRBC: 0 % (ref 0–0)

## 2011-09-11 LAB — HOLD TUBE, BLOOD BANK

## 2011-09-11 MED ORDER — ACETAMINOPHEN 325 MG PO TABS
650.0000 mg | ORAL_TABLET | Freq: Once | ORAL | Status: AC
  Administered 2011-09-11: 650 mg via ORAL

## 2011-09-11 MED ORDER — DIPHENHYDRAMINE HCL 25 MG PO CAPS
25.0000 mg | ORAL_CAPSULE | Freq: Once | ORAL | Status: AC
  Administered 2011-09-11: 25 mg via ORAL

## 2011-09-11 NOTE — Progress Notes (Signed)
Hematology and Oncology Follow Up Visit  [name] [id number] 12-06-1925 78 y.o. 09/11/2011 10:32 AM  CC: [name]. [name], MD  [name], M.D.    Principle Diagnosis: is an 78 year old female with the following issues:  1. Myelodysplastic syndrome, presented with pancytopenia, normal cytogenetics, diagnosed in 2010. She is currently end stage. 2. History of renal cell carcinoma, status post radiofrequency ablation.    Current therapy: Supportive packed red cell transfusion.  She is really not a candidate for any aggressive therapy.   Interim History:  [name] presents today for a followup visit.  She is an elderly frail woman with multiple comorbid conditions. She is not reporting any further fever, is not reporting any signs of any infectious process at this time.  She is quite fatigued today and reports no SOB. She reported no bleeding at this time. No nausea or vomiting. She is eating better at this time. No recent hospitalization or infection at this time. She is feeling better since her last visit.  She is still at SNF>   Medications: I have reviewed the patient's current medications. Current outpatient prescriptions:ALPRAZolam (XANAX) 0.5 MG tablet, Take 1 tablet (0.5 mg total) by mouth at bedtime as needed., Disp: 90 tablet, Rfl: 0;  amLODipine (NORVASC) 10 MG tablet, Take 1 tablet (10 mg total) by mouth daily., Disp: , Rfl: ;  Ascorbic Acid (VITAMIN C) 1000 MG tablet, Take 1,000 mg by mouth daily., Disp: , Rfl: ;  bisoprolol (ZEBETA) 10 MG tablet, Take 10 mg by mouth daily., Disp: , Rfl:  Cranberry 500 MG CAPS, Take 1 capsule by mouth daily., Disp: , Rfl: ;  famotidine (PEPCID) 40 MG tablet, Take 1 tablet (40 mg total) by mouth daily., Disp: 90 tablet, Rfl: 3;  felodipine (PLENDIL) 10 MG 24 hr tablet, Take 1 tablet (10 mg total) by mouth daily., Disp: 90 tablet, Rfl: 3;  folic acid (FOLVITE) 1 MG tablet, Take 1 tablet (1 mg total) by mouth daily., Disp: 90 tablet, Rfl:  1 hydrALAZINE (APRESOLINE) 50 MG tablet, Take 1 tablet (50 mg total) by mouth 3 (three) times daily., Disp: , Rfl: ;  HYDROcodone-acetaminophen (NORCO) 5-325 MG per tablet, Take 1 tablet by mouth every 6 (six) hours as needed. For pain, Disp: , Rfl: ;  methenamine (MANDELAMINE) 1 GM tablet, Take 1,000 mg by mouth Twice daily., Disp: , Rfl: ;  ULORIC 40 MG tablet, Take 1 tablet by mouth daily., Disp: , Rfl:   Allergies:  Allergies  Allergen Reactions  . Clonidine Hydrochloride     REACTION: Reaction per patient, can't take.  Also references hive on forehead \\T \ spts on chest from other meds  . Doxazosin Mesylate     REACTION: holding due to itching  . Doxycycline Hyclate     REACTION: rash  . Erythromycin Ethylsuccinate     REACTION: unspecified  . Meperidine Hcl     REACTION: unspecified  . Penicillins     REACTION: unspecified  . Sulfonamide Derivatives     REACTION: unspecified    Past Medical History, Surgical history, Social history, and Family History were reviewed and updated.  Review of Systems: Constitutional:  Negative for fever, chills, night sweats, anorexia, weight loss, pain. Cardiovascular: no chest pain or dyspnea on exertion Respiratory: no cough, shortness of breath, or wheezing Neurological: no TIA or stroke symptoms Dermatological: negative ENT: negative Skin: Negative. Gastrointestinal: no abdominal pain, change in bowel habits, or black or bloody stools Genito-Urinary: no dysuria, trouble voiding, or  hematuria Hematological and Lymphatic: negative Breast: negative Musculoskeletal: negative Remaining ROS negative. Physical Exam: Blood pressure 159/58, pulse 66, temperature 97.2 F (36.2 C), temperature source Oral, height 5\' 6"  (1.676 m), weight 127 lb 6.4 oz (57.788 kg). ECOG: 2 General appearance: alert Head: Normocephalic, without obvious abnormality, atraumatic Neck: no adenopathy, no carotid bruit, no JVD, supple, symmetrical, trachea midline and  thyroid not enlarged, symmetric, no tenderness/mass/nodules Lymph nodes: Cervical, supraclavicular, and axillary nodes normal. Heart:regular rate and rhythm, S1, S2 normal, no murmur, click, rub or gallop Lung:chest clear, no wheezing, rales, normal symmetric air entry Abdomin: soft, non-tender, without masses or organomegaly EXT:no erythema, induration, or nodules   Lab Results: Lab Results  Component Value Date   WBC 0.9* 09/11/2011   HGB 5.9* 09/11/2011   HCT 17.7* 09/11/2011   MCV 88.9 09/11/2011   PLT 6* 09/11/2011     Chemistry      Component Value Date/Time   NA 143 08/14/2011 0929   K 4.5 08/14/2011 0929   CL 116* 08/14/2011 0929   CO2 18* 08/14/2011 0929   BUN 67* 08/14/2011 0929   CREATININE 3.01* 08/14/2011 0929      Component Value Date/Time   CALCIUM 8.7 08/14/2011 0929   ALKPHOS 130* 08/14/2011 0929   AST 12 08/14/2011 0929   ALT 10 08/14/2011 0929   BILITOT 0.5 08/14/2011 0929       Impression and Plan:  This is an 78 year old woman with the following issues:  1. Myelodysplastic syndrome with pancytopenia.  [name] is not really a candidate for any aggressive therapy such as Revlimid or 5-azacytidine.  She has a very poor performance status and her preference is to do supportive measures only and which we will continue with packed red cell transfusions and growth factor support as needed. She will receive PRBC transfusion today given her symptomatic anemia and repeat in 2 weeks.  2. Thrombocytopenia with platelet count of 10,000.  We will transfuse only if she is having active bleeding.   3. Recurrent urinary tract infection. Improved at this time. 4. Prognosis: I have multiple discussions with her regarding prognosis and it is clearly very poor with very limited life expectancy. She is no code and under hospice care but still wants to continue with the transfusion at this point.   [facility], MD 4/12/201310:32 AM

## 2011-09-11 NOTE — Telephone Encounter (Signed)
Gave pt and pt's son appt calendar for 4/26th, 5/10th blood transfusion, on 5/24th lab,md and [facility].

## 2011-09-12 LAB — TYPE AND SCREEN: Unit division: 0

## 2011-09-24 ENCOUNTER — Other Ambulatory Visit: Payer: Self-pay | Admitting: *Deleted

## 2011-09-24 DIAGNOSIS — D469 Myelodysplastic syndrome, unspecified: Secondary | ICD-10-CM

## 2011-09-25 ENCOUNTER — Ambulatory Visit (HOSPITAL_BASED_OUTPATIENT_CLINIC_OR_DEPARTMENT_OTHER): Payer: Medicare Other

## 2011-09-25 ENCOUNTER — Other Ambulatory Visit (HOSPITAL_BASED_OUTPATIENT_CLINIC_OR_DEPARTMENT_OTHER): Payer: Medicare Other | Admitting: Lab

## 2011-09-25 ENCOUNTER — Other Ambulatory Visit: Payer: Self-pay | Admitting: Oncology

## 2011-09-25 VITALS — BP 172/61 | HR 63 | Temp 97.6°F | Resp 20

## 2011-09-25 DIAGNOSIS — R197 Diarrhea, unspecified: Secondary | ICD-10-CM

## 2011-09-25 DIAGNOSIS — D469 Myelodysplastic syndrome, unspecified: Secondary | ICD-10-CM

## 2011-09-25 DIAGNOSIS — R112 Nausea with vomiting, unspecified: Secondary | ICD-10-CM

## 2011-09-25 DIAGNOSIS — D649 Anemia, unspecified: Secondary | ICD-10-CM

## 2011-09-25 DIAGNOSIS — I10 Essential (primary) hypertension: Secondary | ICD-10-CM

## 2011-09-25 DIAGNOSIS — C649 Malignant neoplasm of unspecified kidney, except renal pelvis: Secondary | ICD-10-CM

## 2011-09-25 LAB — HOLD TUBE, BLOOD BANK

## 2011-09-25 LAB — CBC WITH DIFFERENTIAL/PLATELET
BASO%: 0 % (ref 0.0–2.0)
EOS%: 4.3 % (ref 0.0–7.0)
HGB: 6.3 g/dL — CL (ref 11.6–15.9)
MCH: 29.7 pg (ref 25.1–34.0)
MCHC: 33.2 g/dL (ref 31.5–36.0)
RDW: 16.1 % — ABNORMAL HIGH (ref 11.2–14.5)
WBC: 0.7 10*3/uL — CL (ref 3.9–10.3)
lymph#: 0.4 10*3/uL — ABNORMAL LOW (ref 0.9–3.3)

## 2011-09-25 MED ORDER — DIPHENHYDRAMINE HCL 25 MG PO CAPS
25.0000 mg | ORAL_CAPSULE | Freq: Once | ORAL | Status: AC
  Administered 2011-09-25: 25 mg via ORAL

## 2011-09-25 MED ORDER — ACETAMINOPHEN 325 MG PO TABS
650.0000 mg | ORAL_TABLET | Freq: Once | ORAL | Status: AC
  Administered 2011-09-25: 650 mg via ORAL

## 2011-09-25 MED ORDER — ONDANSETRON 8 MG/50ML IVPB (CHCC)
8.0000 mg | Freq: Once | INTRAVENOUS | Status: AC
  Administered 2011-09-25: 8 mg via INTRAVENOUS

## 2011-09-25 MED ORDER — SODIUM CHLORIDE 0.9 % IJ SOLN
10.0000 mL | INTRAMUSCULAR | Status: DC | PRN
  Filled 2011-09-25: qty 10

## 2011-09-25 MED ORDER — FUROSEMIDE 10 MG/ML IJ SOLN
40.0000 mg | Freq: Once | INTRAMUSCULAR | Status: AC
  Administered 2011-09-25: 40 mg via INTRAMUSCULAR

## 2011-09-25 MED ORDER — ONDANSETRON HCL 8 MG PO TABS: 8.0000 mg | ORAL_TABLET | Freq: Two times a day (BID) | ORAL | Status: AC | PRN

## 2011-09-25 NOTE — Progress Notes (Signed)
1710 - pt has additional diarrhea stool after IV removed and pt redressed.  Stool appears to have approximately 1 tbsp of bright red blood.  Dr Clelia Croft notified.  Per Dr Clelia Croft and pt this is not a new occurrence.  Ok to d/c pt. Pt's friend [name] aware.  Pt out of dept via wheelchair.  dph

## 2011-09-25 NOTE — Patient Instructions (Addendum)
Blood Transfusion Information WHAT IS A BLOOD TRANSFUSION? A transfusion is the replacement of blood or some of its parts. Blood is made up of multiple cells which provide different functions.  Red blood cells carry oxygen and are used for blood loss replacement.   White blood cells fight against infection.   Platelets control bleeding.   Plasma helps clot blood.   Other blood products are available for specialized needs, such as hemophilia or other clotting disorders.   AFTER THE TRANSFUSION  Right after receiving a blood transfusion, you will usually feel much better and more energetic. This is especially true if your red blood cells have gotten low (anemic). The transfusion raises the level of the red blood cells which carry oxygen, and this usually causes an energy increase.   The nurse administering the transfusion will monitor you carefully for complications.  HOME CARE INSTRUCTIONS  No special instructions are needed after a transfusion. You may find your energy is better. Speak with your caregiver about any limitations on activity for underlying diseases you may have. SEEK MEDICAL CARE IF:   Your condition is not improving after your transfusion.   You develop redness or irritation at the intravenous (IV) site.  SEEK IMMEDIATE MEDICAL CARE IF:  Any of the following symptoms occur over the next 12 hours:  Shaking chills.   You have a temperature by mouth above 102 F (38.9 C), not controlled by medicine.   Chest, back, or muscle pain.   People around you feel you are not acting correctly or are confused.   Shortness of breath or difficulty breathing.   Dizziness and fainting.   You get a rash or develop hives.   You have a decrease in urine output.   Your urine turns a dark color or changes to pink, red, or brown.  Any of the following symptoms occur over the next 10 days:  You have a temperature by mouth above 102 F (38.9 C), not controlled by medicine.    Shortness of breath.   Weakness after normal activity.   The white part of the eye turns yellow (jaundice).   You have a decrease in the amount of urine or are urinating less often.   Your urine turns a dark color or changes to pink, red, or brown.  Document Released: 05/15/2000 Document Revised: 05/07/2011 Document Reviewed: 01/02/2008 [facility] Patient Information 2012 [place], [place].  You probably have a gastrointestinal virus. Push po fluids (clear liquids) until you have no further vomiting or diarrhea. Then advance diet slowly to bland, then add milk products last. Take your Zofran for nausea every 12 hours if needed. For diarrhea take Imodium AD-take #2 with first diarrhea stool, then one after each loose stool.

## 2011-09-25 NOTE — Progress Notes (Signed)
Patient seen per request of nursing in the infusion area. She has vomited x2 and has had diarrhea x2 today. Afebrile. BP is elevated with SBP >200. RN reports that she found [name]'s BP medication stuck in her pants and did not receive this today. Patient reports that her stomach hurt her then after vomiting, pain improved. No chest pain, shortness of breath. No edema.   Physical exam: Awake, talkative, no acute distress HEENT: Eyes are puffy. CV: RRR. No LE edema Lungs: Rales to bases. Abdomen: Positive BS. Soft, with mild tenderness with palpation.  A/P:   1. Gastroenteritis: Likely viral. Discussed symptomatic management. Recommend clear liquids for next 24 hours then advance as tolerated. Zofran prescription sent to pharmacy. Encouraged her to use Imodium as needed for diarrhea. Stool for c diff sent.  2. HTN/CHF: BP elevated and rales to lung bases. Eyes are puffy. She did not receive BP med today. Will give Lasix 40 mg IV x1 dose. She is to go to ER for acute shortness of breath or development of chest pain.

## 2011-09-25 NOTE — Progress Notes (Signed)
1510--Patient taken to bathroom and had episode of abdominal cramping, diarrhea and small amount of yellow-green emesis. No blood in stool. BP elevated. Noted that when patient pulled her pants down, her BP pill fell in floor that she had not taken, as well as antibiotic. She reports that she has just returned to her home on 4/23 after staying in a long term care facility and does believe there was a GI virus "going around". Stomach pain resolved after BM. Notified Freight forwarder. 1545--2nd diarrhea stool and episode of emesis (no bleeding). Started NS at [facility] since blood has completed.  1610--Lasix 40 mg IV per order of Jenell Milliner, NP for elevated BP and patient appears to have some puffiness around her eyes and face. She denies any dyspnea or chest pain. Abdominal pain has resolved. 1630--3rd episode of yellow emesis (small amount). Order obtained for IV Zofran. 1700--Spoke with friend [name]) and he reports that she will have someone stay with her 24/7 and is aware of the N/V and diarrhea. Provided AVS and made him aware that Zofran has been called in and he needs to obtain Imodium for her. Dr. Clelia Croft over to examine patient and agrees she may return home.

## 2011-09-27 LAB — TYPE AND SCREEN
ABO/RH(D): O NEG
Antibody Screen: NEGATIVE
Unit division: 0
Unit division: 0

## 2011-09-30 ENCOUNTER — Encounter (HOSPITAL_COMMUNITY)
Admission: RE | Admit: 2011-09-30 | Discharge: 2011-09-30 | Disposition: A | Discharge status: Home / Self Care | Payer: Medicare Other | Source: Ambulatory Visit | Attending: Medical | Admitting: Medical

## 2011-09-30 DIAGNOSIS — D469 Myelodysplastic syndrome, unspecified: Secondary | ICD-10-CM

## 2011-10-09 ENCOUNTER — Other Ambulatory Visit: Payer: Self-pay | Admitting: Oncology

## 2011-10-09 ENCOUNTER — Ambulatory Visit (HOSPITAL_BASED_OUTPATIENT_CLINIC_OR_DEPARTMENT_OTHER): Payer: Medicare Other | Admitting: Lab

## 2011-10-09 ENCOUNTER — Ambulatory Visit: Payer: Medicare Other

## 2011-10-09 VITALS — BP 197/54 | HR 51 | Temp 97.6°F | Resp 20

## 2011-10-09 DIAGNOSIS — D649 Anemia, unspecified: Secondary | ICD-10-CM

## 2011-10-09 DIAGNOSIS — D469 Myelodysplastic syndrome, unspecified: Secondary | ICD-10-CM

## 2011-10-09 LAB — CBC WITH DIFFERENTIAL/PLATELET
BASO%: 0 % (ref 0.0–2.0)
EOS%: 3.8 % (ref 0.0–7.0)
LYMPH%: 65.4 % — ABNORMAL HIGH (ref 14.0–49.7)
MCH: 29.2 pg (ref 25.1–34.0)
MCHC: 32.9 g/dL (ref 31.5–36.0)
MONO#: 0 10*3/uL — ABNORMAL LOW (ref 0.1–0.9)
MONO%: 3.8 % (ref 0.0–14.0)
NEUT%: 27 % — ABNORMAL LOW (ref 38.4–76.8)
Platelets: 4 10*3/uL — CL (ref 145–400)
RBC: 1.95 10*6/uL — ABNORMAL LOW (ref 3.70–5.45)
WBC: 0.5 10*3/uL — CL (ref 3.9–10.3)
nRBC: 0 % (ref 0–0)

## 2011-10-09 MED ORDER — DIPHENHYDRAMINE HCL 25 MG PO CAPS
25.0000 mg | ORAL_CAPSULE | Freq: Once | ORAL | Status: AC
  Administered 2011-10-09: 25 mg via ORAL

## 2011-10-09 MED ORDER — SODIUM CHLORIDE 0.9 % IV SOLN
250.0000 mL | Freq: Once | INTRAVENOUS | Status: AC
  Administered 2011-10-09: 250 mL via INTRAVENOUS

## 2011-10-09 MED ORDER — ACETAMINOPHEN 325 MG PO TABS
650.0000 mg | ORAL_TABLET | Freq: Once | ORAL | Status: AC
  Administered 2011-10-09: 650 mg via ORAL

## 2011-10-10 LAB — TYPE AND SCREEN
ABO/RH(D): O NEG
Unit division: 0

## 2011-10-12 ENCOUNTER — Inpatient Hospital Stay (HOSPITAL_COMMUNITY)
Admission: EM | Admit: 2011-10-12 | Discharge: 2011-10-19 | DRG: 812 | Disposition: A | Discharge status: Hospice | Payer: Medicare Other | Attending: Internal Medicine | Admitting: Internal Medicine

## 2011-10-12 ENCOUNTER — Encounter (HOSPITAL_COMMUNITY): Payer: Self-pay | Admitting: Emergency Medicine

## 2011-10-12 ENCOUNTER — Emergency Department (HOSPITAL_COMMUNITY): Payer: Medicare Other

## 2011-10-12 DIAGNOSIS — E860 Dehydration: Secondary | ICD-10-CM | POA: Diagnosis present

## 2011-10-12 DIAGNOSIS — W19XXXA Unspecified fall, initial encounter: Secondary | ICD-10-CM | POA: Diagnosis present

## 2011-10-12 DIAGNOSIS — I5022 Chronic systolic (congestive) heart failure: Secondary | ICD-10-CM | POA: Diagnosis present

## 2011-10-12 DIAGNOSIS — I509 Heart failure, unspecified: Secondary | ICD-10-CM | POA: Diagnosis present

## 2011-10-12 DIAGNOSIS — M25559 Pain in unspecified hip: Secondary | ICD-10-CM | POA: Diagnosis present

## 2011-10-12 DIAGNOSIS — Z880 Allergy status to penicillin: Secondary | ICD-10-CM

## 2011-10-12 DIAGNOSIS — D61818 Other pancytopenia: Secondary | ICD-10-CM | POA: Diagnosis present

## 2011-10-12 DIAGNOSIS — Z79899 Other long term (current) drug therapy: Secondary | ICD-10-CM

## 2011-10-12 DIAGNOSIS — E785 Hyperlipidemia, unspecified: Secondary | ICD-10-CM | POA: Diagnosis present

## 2011-10-12 DIAGNOSIS — F329 Major depressive disorder, single episode, unspecified: Secondary | ICD-10-CM | POA: Diagnosis present

## 2011-10-12 DIAGNOSIS — Z66 Do not resuscitate: Secondary | ICD-10-CM | POA: Diagnosis present

## 2011-10-12 DIAGNOSIS — Z888 Allergy status to other drugs, medicaments and biological substances status: Secondary | ICD-10-CM

## 2011-10-12 DIAGNOSIS — M810 Age-related osteoporosis without current pathological fracture: Secondary | ICD-10-CM | POA: Diagnosis present

## 2011-10-12 DIAGNOSIS — D631 Anemia in chronic kidney disease: Secondary | ICD-10-CM | POA: Diagnosis present

## 2011-10-12 DIAGNOSIS — F3289 Other specified depressive episodes: Secondary | ICD-10-CM | POA: Diagnosis present

## 2011-10-12 DIAGNOSIS — K5730 Diverticulosis of large intestine without perforation or abscess without bleeding: Secondary | ICD-10-CM | POA: Diagnosis present

## 2011-10-12 DIAGNOSIS — K219 Gastro-esophageal reflux disease without esophagitis: Secondary | ICD-10-CM | POA: Diagnosis present

## 2011-10-12 DIAGNOSIS — Z85528 Personal history of other malignant neoplasm of kidney: Secondary | ICD-10-CM

## 2011-10-12 DIAGNOSIS — Y92009 Unspecified place in unspecified non-institutional (private) residence as the place of occurrence of the external cause: Secondary | ICD-10-CM

## 2011-10-12 DIAGNOSIS — M25519 Pain in unspecified shoulder: Secondary | ICD-10-CM | POA: Diagnosis present

## 2011-10-12 DIAGNOSIS — I7300 Raynaud's syndrome without gangrene: Secondary | ICD-10-CM | POA: Diagnosis present

## 2011-10-12 DIAGNOSIS — F411 Generalized anxiety disorder: Secondary | ICD-10-CM | POA: Diagnosis present

## 2011-10-12 DIAGNOSIS — Z8711 Personal history of peptic ulcer disease: Secondary | ICD-10-CM

## 2011-10-12 DIAGNOSIS — M109 Gout, unspecified: Secondary | ICD-10-CM | POA: Diagnosis present

## 2011-10-12 DIAGNOSIS — N189 Chronic kidney disease, unspecified: Secondary | ICD-10-CM | POA: Diagnosis present

## 2011-10-12 DIAGNOSIS — R7309 Other abnormal glucose: Secondary | ICD-10-CM | POA: Diagnosis present

## 2011-10-12 DIAGNOSIS — H3530 Unspecified macular degeneration: Secondary | ICD-10-CM | POA: Diagnosis present

## 2011-10-12 DIAGNOSIS — J438 Other emphysema: Secondary | ICD-10-CM | POA: Diagnosis present

## 2011-10-12 DIAGNOSIS — H1130 Conjunctival hemorrhage, unspecified eye: Secondary | ICD-10-CM | POA: Diagnosis present

## 2011-10-12 DIAGNOSIS — N184 Chronic kidney disease, stage 4 (severe): Secondary | ICD-10-CM | POA: Diagnosis present

## 2011-10-12 DIAGNOSIS — Z515 Encounter for palliative care: Secondary | ICD-10-CM

## 2011-10-12 DIAGNOSIS — Z8673 Personal history of transient ischemic attack (TIA), and cerebral infarction without residual deficits: Secondary | ICD-10-CM

## 2011-10-12 DIAGNOSIS — M1990 Unspecified osteoarthritis, unspecified site: Secondary | ICD-10-CM | POA: Diagnosis present

## 2011-10-12 DIAGNOSIS — D469 Myelodysplastic syndrome, unspecified: Principal | ICD-10-CM | POA: Diagnosis present

## 2011-10-12 DIAGNOSIS — R599 Enlarged lymph nodes, unspecified: Secondary | ICD-10-CM | POA: Diagnosis present

## 2011-10-12 DIAGNOSIS — N039 Chronic nephritic syndrome with unspecified morphologic changes: Secondary | ICD-10-CM | POA: Diagnosis present

## 2011-10-12 DIAGNOSIS — I129 Hypertensive chronic kidney disease with stage 1 through stage 4 chronic kidney disease, or unspecified chronic kidney disease: Secondary | ICD-10-CM | POA: Diagnosis present

## 2011-10-12 DIAGNOSIS — E875 Hyperkalemia: Secondary | ICD-10-CM | POA: Diagnosis present

## 2011-10-12 DIAGNOSIS — F05 Delirium due to known physiological condition: Secondary | ICD-10-CM | POA: Diagnosis present

## 2011-10-12 NOTE — ED Notes (Signed)
Patient reports that she loss her balance and fell. Patient now complains of right shoulder pain and right hip pain. EMS reports shorten of right leg.

## 2011-10-12 NOTE — ED Notes (Signed)
[id number]<AV> Expected date:<BR> Expected time:<BR> Means of arrival:<BR> Comments:<BR> EMS Fall, Hip deformity

## 2011-10-12 NOTE — ED Provider Notes (Signed)
History     CSN: [id number]  Arrival date & time 10/12/11  2128   First MD Initiated Contact with Patient 10/12/11 2204      Chief Complaint  Patient presents with  . Hip Injury    right   . Shoulder Injury    right   . Fall    loss balance    (Consider location/radiation/quality/duration/timing/severity/associated sxs/prior treatment) HPI Comments: Patient states she was rushing to answer the phone when she lost her balance and fell to her.  She is now complaining of right shoulder pain as well as bilateral hip pain, stating that both hips have been repaired in the past.  Noted L eye subconjunctival hemophage in medial 1/3  Patient states she has been rubbing her eye due to arrergies  Patient is a 78 y.o. female presenting with shoulder injury and fall. The history is provided by the patient.  Shoulder Injury This is a new problem. The current episode started today. The problem occurs constantly. Pertinent negatives include no joint swelling, numbness or weakness. The symptoms are aggravated by exertion. She has tried nothing for the symptoms. The treatment provided no relief.  Fall Pertinent negatives include no numbness.    Past Medical History  Diagnosis Date  . Acquired pancytopenia   . Hypertension   . Depression   . Osteoporosis   . HLD (hyperlipidemia)   . Fracture, hip     bilateral  . CVA (cerebral infarction)   . Chronic kidney disease   . Renal cell carcinoma   . Stricture esophagus   . Glaucoma   . CHF (congestive heart failure)   . Systolic dysfunction   . Raynaud disease   . Hyperhomocysteinemia   . Lymphocytic gastritis   . Anxiety   . DJD (degenerative joint disease)   . Gastric ulcer   . Myelodysplastic syndrome   . Hx of colonic polyps   . Myelodysplastic syndrome   . C. difficile colitis   . Diverticulosis   . Gout   . GERD (gastroesophageal reflux disease)   . Anxiety and depression   . Anemia   . Torn rotator cuff     right shoulder      Past Surgical History  Procedure Date  . Abdominal hysterectomy   . Cataract extraction   . Varicose vein surgery   . Tubal ligation   . Hip fracture surgery      bilateral  . Spine surgery   . Kidney surgery     for renal carcinoma    Family History  Problem Relation Age of Onset  . Breast cancer Sister   . Diabetes Brother   . Colon cancer Neg Hx     History  Substance Use Topics  . Smoking status: Never Smoker   . Smokeless tobacco: Never Used  . Alcohol Use: No    OB History    Grav Para Term Preterm Abortions TAB SAB Ect Mult Living                  Review of Systems  Musculoskeletal: Negative for back pain and joint swelling.  Neurological: Negative for dizziness, weakness and numbness.    Allergies  Clonidine hydrochloride; Doxazosin mesylate; Doxycycline hyclate; Erythromycin ethylsuccinate; Meperidine hcl; Penicillins; and Sulfonamide derivatives  Home Medications   Current Outpatient Rx  Name Route Sig Dispense Refill  . ALPRAZOLAM 0.5 MG PO TABS Oral Take 1 tablet (0.5 mg total) by mouth at bedtime as needed. 90 tablet 0  .  AMLODIPINE BESYLATE 10 MG PO TABS Oral Take 1 tablet (10 mg total) by mouth daily.    Marland Kitchen VITAMIN C 1000 MG PO TABS Oral Take 1,000 mg by mouth daily.    Marland Kitchen BISOPROLOL FUMARATE 10 MG PO TABS Oral Take 10 mg by mouth daily.    Marland Kitchen CRANBERRY 500 MG PO CAPS Oral Take 1 capsule by mouth daily.    Marland Kitchen FAMOTIDINE 40 MG PO TABS Oral Take 1 tablet (40 mg total) by mouth daily. 90 tablet 3  . FELODIPINE ER 10 MG PO TB24 Oral Take 1 tablet (10 mg total) by mouth daily. 90 tablet 3  . FOLIC ACID 1 MG PO TABS Oral Take 1 tablet (1 mg total) by mouth daily. 90 tablet 1  . HYDRALAZINE HCL 50 MG PO TABS Oral Take 1 tablet (50 mg total) by mouth 3 (three) times daily.    Marland Kitchen HYDROCODONE-ACETAMINOPHEN 5-325 MG PO TABS Oral Take 1 tablet by mouth every 6 (six) hours as needed. For pain    . METHENAMINE MANDELATE 1 G PO TABS Oral Take 1,000 mg by mouth  Twice daily.    . SERTRALINE HCL 25 MG PO TABS      . ULORIC 40 MG PO TABS Oral Take 1 tablet by mouth daily.      BP 194/63  Pulse 58  Temp(Src) 98.9 F (37.2 C) (Oral)  Resp 20  SpO2 91%  Physical Exam  Constitutional: She appears well-developed and well-nourished.  HENT:  Head: Normocephalic.  Eyes:    Neck: Normal range of motion.  Cardiovascular: Normal rate.   Pulmonary/Chest: Effort normal.  Abdominal: Soft.  Musculoskeletal: She exhibits no edema and no tenderness.       Right hip: She exhibits decreased range of motion. She exhibits no bony tenderness, no swelling and no deformity.       Left hip: She exhibits decreased range of motion. She exhibits no bony tenderness, no swelling and no deformity.       Arms: Neurological: She is alert.  Skin: Skin is warm. No rash noted.    ED Course  Procedures (including critical care time)  Labs Reviewed - No data to display No results found.   No diagnosis found.    MDM          Arman Filter, NP 10/14/11 0246

## 2011-10-13 ENCOUNTER — Emergency Department (HOSPITAL_COMMUNITY): Payer: Medicare Other

## 2011-10-13 ENCOUNTER — Encounter (HOSPITAL_COMMUNITY): Payer: Self-pay | Admitting: Emergency Medicine

## 2011-10-13 DIAGNOSIS — F0390 Unspecified dementia without behavioral disturbance: Secondary | ICD-10-CM

## 2011-10-13 DIAGNOSIS — F05 Delirium due to known physiological condition: Secondary | ICD-10-CM

## 2011-10-13 DIAGNOSIS — N189 Chronic kidney disease, unspecified: Secondary | ICD-10-CM | POA: Diagnosis present

## 2011-10-13 DIAGNOSIS — E119 Type 2 diabetes mellitus without complications: Secondary | ICD-10-CM

## 2011-10-13 DIAGNOSIS — D46Z Other myelodysplastic syndromes: Secondary | ICD-10-CM

## 2011-10-13 DIAGNOSIS — E875 Hyperkalemia: Secondary | ICD-10-CM | POA: Diagnosis present

## 2011-10-13 DIAGNOSIS — N179 Acute kidney failure, unspecified: Secondary | ICD-10-CM

## 2011-10-13 LAB — POCT I-STAT, CHEM 8
Creatinine, Ser: 2.6 mg/dL — ABNORMAL HIGH (ref 0.50–1.10)
Glucose, Bld: 137 mg/dL — ABNORMAL HIGH (ref 70–99)
Hemoglobin: 7.5 g/dL — ABNORMAL LOW (ref 12.0–15.0)
TCO2: 18 mmol/L (ref 0–100)

## 2011-10-13 LAB — CBC
HCT: 22.4 % — ABNORMAL LOW (ref 36.0–46.0)
MCH: 29.8 pg (ref 26.0–34.0)
MCHC: 33.9 g/dL (ref 30.0–36.0)
MCV: 87.8 fL (ref 78.0–100.0)
RDW: 16.2 % — ABNORMAL HIGH (ref 11.5–15.5)

## 2011-10-13 LAB — COMPREHENSIVE METABOLIC PANEL
ALT: 14 U/L (ref 0–35)
Alkaline Phosphatase: 143 U/L — ABNORMAL HIGH (ref 39–117)
Chloride: 114 mEq/L — ABNORMAL HIGH (ref 96–112)
GFR calc Af Amer: 20 mL/min — ABNORMAL LOW (ref >90)
Glucose, Bld: 126 mg/dL — ABNORMAL HIGH (ref 70–99)
Potassium: 4.9 mEq/L (ref 3.5–5.1)
Sodium: 142 mEq/L (ref 135–145)
Total Bilirubin: 0.3 mg/dL (ref 0.3–1.2)
Total Protein: 5.3 g/dL — ABNORMAL LOW (ref 6.0–8.3)

## 2011-10-13 LAB — AMMONIA: Ammonia: 42 umol/L (ref 11–60)

## 2011-10-13 LAB — DIFFERENTIAL
Band Neutrophils: 0 % (ref 0–10)
Eosinophils Absolute: 0 10*3/uL (ref 0.0–0.7)
Monocytes Absolute: 0 10*3/uL — ABNORMAL LOW (ref 0.1–1.0)
nRBC: 0 /100 WBC

## 2011-10-13 MED ORDER — ACETAMINOPHEN 325 MG PO TABS
650.0000 mg | ORAL_TABLET | Freq: Four times a day (QID) | ORAL | Status: DC
  Administered 2011-10-13 – 2011-10-19 (×23): 650 mg via ORAL
  Filled 2011-10-13 (×22): qty 2
  Filled 2011-10-13: qty 1
  Filled 2011-10-13 (×8): qty 2
  Filled 2011-10-13: qty 1
  Filled 2011-10-13 (×4): qty 2

## 2011-10-13 MED ORDER — BISOPROLOL FUMARATE 10 MG PO TABS
10.0000 mg | ORAL_TABLET | Freq: Every day | ORAL | Status: DC
  Administered 2011-10-13 – 2011-10-18 (×6): 10 mg via ORAL
  Filled 2011-10-13 (×7): qty 1

## 2011-10-13 MED ORDER — POLYETHYLENE GLYCOL 3350 17 G PO PACK
17.0000 g | PACK | Freq: Every day | ORAL | Status: DC | PRN
  Filled 2011-10-13: qty 1

## 2011-10-13 MED ORDER — ONDANSETRON HCL 4 MG PO TABS: 4.0000 mg | ORAL_TABLET | Freq: Four times a day (QID) | ORAL | Status: DC | PRN

## 2011-10-13 MED ORDER — HALOPERIDOL 0.5 MG PO TABS
0.5000 mg | ORAL_TABLET | Freq: Three times a day (TID) | ORAL | Status: DC | PRN
  Filled 2011-10-13: qty 2

## 2011-10-13 MED ORDER — SERTRALINE HCL 25 MG PO TABS
25.0000 mg | ORAL_TABLET | Freq: Every day | ORAL | Status: DC
  Administered 2011-10-13 – 2011-10-18 (×6): 25 mg via ORAL
  Filled 2011-10-13 (×7): qty 1

## 2011-10-13 MED ORDER — FELODIPINE ER 10 MG PO TB24
10.0000 mg | ORAL_TABLET | Freq: Every day | ORAL | Status: DC
  Administered 2011-10-13 – 2011-10-18 (×6): 10 mg via ORAL
  Filled 2011-10-13 (×7): qty 1

## 2011-10-13 MED ORDER — OXYCODONE HCL 5 MG PO TABS: 5.0000 mg | ORAL_TABLET | Freq: Two times a day (BID) | ORAL | Status: DC | PRN

## 2011-10-13 MED ORDER — METHENAMINE MANDELATE 1 G PO TABS
1000.0000 mg | ORAL_TABLET | Freq: Two times a day (BID) | ORAL | Status: DC
  Administered 2011-10-13 – 2011-10-18 (×11): 1000 mg via ORAL
  Filled 2011-10-13 (×14): qty 1

## 2011-10-13 MED ORDER — DOCUSATE SODIUM 100 MG PO CAPS
100.0000 mg | ORAL_CAPSULE | Freq: Two times a day (BID) | ORAL | Status: DC
  Administered 2011-10-13 – 2011-10-18 (×10): 100 mg via ORAL
  Filled 2011-10-13 (×14): qty 1

## 2011-10-13 MED ORDER — HYDRALAZINE HCL 20 MG/ML IJ SOLN
5.0000 mg | Freq: Once | INTRAMUSCULAR | Status: AC
  Administered 2011-10-13: 5 mg via INTRAVENOUS
  Filled 2011-10-13: qty 1

## 2011-10-13 MED ORDER — ONDANSETRON HCL 4 MG/2ML IJ SOLN: 4.0000 mg | Freq: Four times a day (QID) | INTRAMUSCULAR | Status: DC | PRN

## 2011-10-13 MED ORDER — SODIUM POLYSTYRENE SULFONATE 15 GM/60ML PO SUSP
15.0000 g | Freq: Once | ORAL | Status: AC
  Administered 2011-10-13: 15 g via ORAL
  Filled 2011-10-13: qty 60

## 2011-10-13 MED ORDER — SENNA 8.6 MG PO TABS
1.0000 | ORAL_TABLET | Freq: Two times a day (BID) | ORAL | Status: DC
  Administered 2011-10-13 – 2011-10-18 (×10): 8.6 mg via ORAL
  Filled 2011-10-13 (×10): qty 1

## 2011-10-13 MED ORDER — SODIUM CHLORIDE 0.9 % IV SOLN
Freq: Once | INTRAVENOUS | Status: AC
  Administered 2011-10-13: 15:00:00 via INTRAVENOUS

## 2011-10-13 MED ORDER — FOLIC ACID 1 MG PO TABS
1.0000 mg | ORAL_TABLET | Freq: Every day | ORAL | Status: DC
  Administered 2011-10-13 – 2011-10-15 (×3): 1 mg via ORAL
  Filled 2011-10-13 (×3): qty 1

## 2011-10-13 MED ORDER — AMLODIPINE BESYLATE 10 MG PO TABS
10.0000 mg | ORAL_TABLET | Freq: Every day | ORAL | Status: DC
  Administered 2011-10-13 – 2011-10-18 (×6): 10 mg via ORAL
  Filled 2011-10-13 (×7): qty 1

## 2011-10-13 MED ORDER — MORPHINE SULFATE 2 MG/ML IJ SOLN
2.0000 mg | Freq: Three times a day (TID) | INTRAMUSCULAR | Status: DC | PRN
  Administered 2011-10-13 – 2011-10-14 (×3): 2 mg via INTRAVENOUS
  Filled 2011-10-13 (×4): qty 1

## 2011-10-13 MED ORDER — SODIUM CHLORIDE 0.9 % IV SOLN
INTRAVENOUS | Status: DC
  Administered 2011-10-16 – 2011-10-17 (×2): via INTRAVENOUS
  Administered 2011-10-17 – 2011-10-18 (×2): 75 mL/h via INTRAVENOUS

## 2011-10-13 MED ORDER — HYDRALAZINE HCL 50 MG PO TABS
50.0000 mg | ORAL_TABLET | Freq: Three times a day (TID) | ORAL | Status: DC
  Administered 2011-10-14 – 2011-10-18 (×14): 50 mg via ORAL
  Filled 2011-10-13 (×21): qty 1

## 2011-10-13 MED ORDER — FEBUXOSTAT 40 MG PO TABS
40.0000 mg | ORAL_TABLET | Freq: Every day | ORAL | Status: DC
  Administered 2011-10-13 – 2011-10-18 (×6): 40 mg via ORAL
  Filled 2011-10-13 (×8): qty 1

## 2011-10-13 MED ORDER — ONDANSETRON HCL 4 MG/2ML IJ SOLN
4.0000 mg | Freq: Once | INTRAMUSCULAR | Status: AC
  Administered 2011-10-13: 4 mg via INTRAVENOUS
  Filled 2011-10-13: qty 2

## 2011-10-13 MED ORDER — HYDRALAZINE HCL 20 MG/ML IJ SOLN: 10.0000 mg | Freq: Four times a day (QID) | INTRAMUSCULAR | Status: DC | PRN

## 2011-10-13 MED ORDER — ACETAMINOPHEN 650 MG RE SUPP
650.0000 mg | Freq: Four times a day (QID) | RECTAL | Status: DC
  Filled 2011-10-13 (×24): qty 1

## 2011-10-13 MED ORDER — SODIUM CHLORIDE 0.9 % IV SOLN
Freq: Once | INTRAVENOUS | Status: AC
  Administered 2011-10-13: 06:00:00 via INTRAVENOUS

## 2011-10-13 MED ORDER — MORPHINE SULFATE 4 MG/ML IJ SOLN
4.0000 mg | Freq: Once | INTRAMUSCULAR | Status: AC
  Administered 2011-10-13: 4 mg via INTRAVENOUS
  Filled 2011-10-13: qty 1

## 2011-10-13 NOTE — Progress Notes (Signed)
Patient know to me with end stage MDS.  She is becoming transfusion refractory. I am in favour of stopping transfusion and placement at [facility] if possible. Please call with questions.

## 2011-10-13 NOTE — ED Notes (Signed)
Pt refuses to have labs drawn. Dondra Spry, NP aware.

## 2011-10-13 NOTE — Progress Notes (Signed)
PT Cancellation Note  Evaluation cancelled today due to patient's refusal to participate.  Attempted to encourage pt to perform mobility but pt politely declined all attempts (assist to bathroom, eating in recliner, etc).  [name],[name] 10/13/2011, 12:24 PM Pager: [phone]

## 2011-10-13 NOTE — H&P (Signed)
PCP:  Judie Petit, MD, MD   Consistent with prior admission notes.  Cannot confirm myself Dr. Clelia Croft, oncology  Chief Complaint:  Fall  HPI: [phone] with h/o end stage MDS, renal cell ca s/p RFA, systolic HF, and a  host of other medical problems, on home hospice who presents with a  fall.   Per Dr. Alver Fisher note from 08/2011 she is end stage MDS, not candidate  for any aggressive therapy, is supported with transfusions. She has poor  performance status, prefers supportive measures only. She is noted to be  no code, under hospice care. Before that, she was admitted in 07/2011 to  [facility] after a syncopal event, noted to have GNR UTI, treated, and also  have sundowning/delirium, waxning and waning. She was d/c'd to a SNF.   She cannot give history, she currently appears delirious or demented and  sundowning. She is alert and moderately attentive but resistant to  conversation and exam, defiant of everything I say or do. There was  reportedly a son here earlier but he is gone. Therefore, per ED staff it  appears the pt was at home, going to answer the phone when she had a  mechanical fall. She had shoulder and hip pain.   In the ED vitals were stable. Chem panel with renal 56/2.6, hyperK 5.2,  and stable pancytopenia. CXR with emphysema and fibrosis, cardiomegaly  and suggesting developing vascular congestion, and healing right rib  fractures. Left hand plain film with chronic calcifications -- avulsion  fractures vs cartilage calcifiations. L-spine plain film with old  findings. Right shoulder and pelvic films negative for acute. Pt was  given morphine and zofran. She refused any further blood draws in the  ED. Admission requested bc apparently pt was unable to ambulate in the  ED.   ROS otherwise not obtainable.   Past Medical History  Diagnosis Date  . Acquired pancytopenia   . Hypertension   . Depression   . Osteoporosis   . HLD (hyperlipidemia)   . Fracture, hip     bilateral  . CVA (cerebral infarction)   . Chronic kidney disease   . Stricture esophagus   . Glaucoma   . CHF (congestive heart failure)   . Systolic dysfunction   . Raynaud disease   . Hyperhomocysteinemia   . Lymphocytic gastritis   . Anxiety   . DJD (degenerative joint disease)   . Gastric ulcer   . Hx of colonic polyps   . C. difficile colitis   . Diverticulosis   . Gout   . GERD (gastroesophageal reflux disease)   . Anxiety and depression   . Anemia   . Torn rotator cuff     right shoulder  . Renal cell carcinoma     S/p RFA  . Myelodysplastic syndrome     End stage, followed by Dr. Clelia Croft, supportive with transfusions for Hgb < 7 or plts for active bleeding    Past Surgical History  Procedure Date  . Abdominal hysterectomy   . Cataract extraction   . Varicose vein surgery   . Tubal ligation   . Hip fracture surgery      bilateral  . Spine surgery   . Kidney surgery     for renal carcinoma    Medications:  HOME MEDS: Not reconcilable by me  Prior to Admission medications   Medication Sig Start Date End Date Taking? Authorizing Provider  ALPRAZolam Prudy Feeler) 0.5 MG tablet Take 1 tablet (0.5 mg total) by  mouth at bedtime as needed. 08/07/11  Yes Jessica U Vann, DO  amLODipine (NORVASC) 10 MG tablet Take 1 tablet (10 mg total) by mouth daily. 08/07/11 08/06/12 Yes Joseph Art, DO  Ascorbic Acid (VITAMIN C) 1000 MG tablet Take 1,000 mg by mouth daily.   Yes Historical Provider, MD  bisoprolol (ZEBETA) 10 MG tablet Take 10 mg by mouth daily.   Yes Historical Provider, MD  Cranberry 500 MG CAPS Take 1 capsule by mouth daily.   Yes Historical Provider, MD  famotidine (PEPCID) 40 MG tablet Take 1 tablet (40 mg total) by mouth daily. 07/01/11  Yes Lindley Magnus, MD  felodipine (PLENDIL) 10 MG 24 hr tablet Take 1 tablet (10 mg total) by mouth daily. 04/15/11 04/14/12 Yes Bruce Romilda Garret, MD  folic acid (FOLVITE) 1 MG tablet Take 1 tablet (1 mg total) by mouth daily.  02/10/11  Yes Lindley Magnus, MD  hydrALAZINE (APRESOLINE) 50 MG tablet Take 1 tablet (50 mg total) by mouth 3 (three) times daily. 08/07/11 08/06/12 Yes Joseph Art, DO  HYDROcodone-acetaminophen (NORCO) 5-325 MG per tablet Take 1 tablet by mouth every 6 (six) hours as needed. For pain   Yes Historical Provider, MD  methenamine (MANDELAMINE) 1 GM tablet Take 1,000 mg by mouth Twice daily. 07/07/11  Yes Historical Provider, MD  sertraline (ZOLOFT) 25 MG tablet Take 25 mg by mouth daily.  09/22/11  Yes Historical Provider, MD  ULORIC 40 MG tablet Take 1 tablet by mouth daily. 12/15/10  Yes Historical Provider, MD    Allergies:  Allergies  Allergen Reactions  . Clonidine Hydrochloride     REACTION: Reaction per patient, can't take.  Also references hive on forehead \\T \ spts on chest from other meds  . Doxazosin Mesylate     REACTION: holding due to itching  . Doxycycline Hyclate     REACTION: rash  . Erythromycin Ethylsuccinate     REACTION: unspecified  . Meperidine Hcl     REACTION: unspecified  . Penicillins     REACTION: unspecified  . Sulfonamide Derivatives     REACTION: unspecified    Social History:   reports that she has never smoked. She has never used smokeless tobacco. She reports that she does not drink alcohol or use illicit drugs.  Family History: Family History  Problem Relation Age of Onset  . Breast cancer Sister   . Diabetes Brother   . Colon cancer Neg Hx     Physical Exam: Filed Vitals:   10/13/11 0100 10/13/11 0141 10/13/11 0421 10/13/11 0424  BP: 198/58 191/61 189/69   Pulse: 65 66 72   Temp:   97.4 F (36.3 C)   TempSrc:   Oral   Resp:  15 15   SpO2: 96% 95% 77% 93%   Blood pressure 189/69, pulse 72, temperature 97.4 F (36.3 C), temperature source Oral, resp. rate 15, SpO2 93.00%. Gen: Elderly, combative and uncooperative F in no cardiopulmonary  distress, appears either delirious or demented, not participating in  conversational other than  defiantly answering "you're not a doctor" and  "I'm not getting admitted" and resisting all exam maneuvers HEENT: Grossly, left sclera with some mild hemorrhage, not severe.  Pupils round and reactive ~3-68mm equal bilaterally. No nystagmus noted  grossly. Cannot examine mouth, pt pushes hand away from forced maneuver Lungs: Cannot examine, pt resists exam and pushes me away, but grossly  is breathing comfortably, no increased WOB, no accessory muscles Heart: Same as above, cannot  examine Abd: Soft, not rigid, not tender, no facial grimacing to TTP but limited  exam Extrem: Decreased muscle bulk, thin, warm, diffuse senile ecchymoses,  LUE in wrist brace and ecchymosis noted. BLE's with minimal pitting  edema noted, diffuse petechiae noted. Warm, not cold or cyanotic Neuro: Alert but not attentive or fully conversant other than resisting  all attempts to interview or examine her. Looks around, makes eye  contact, doesn't answer questions directly but can speak without aphasia  or slurring. Face symmetric, tongue midline. Moves extremities on her  own as far as I can tell. Otherwise not examinable.    Labs & Imaging Results for orders placed during the hospital encounter of 10/12/11 (from the past 48 hour(s))  CBC     Status: Abnormal   Collection Time   10/13/11  2:16 AM      Component Value Range Comment   WBC 0.5 (*) 4.0 - 10.5 (K/uL)    RBC 2.55 (*) 3.87 - 5.11 (MIL/uL)    Hemoglobin 7.6 (*) 12.0 - 15.0 (g/dL)    HCT 78.2 (*) 95.6 - 46.0 (%)    MCV 87.8  78.0 - 100.0 (fL)    MCH 29.8  26.0 - 34.0 (pg)    MCHC 33.9  30.0 - 36.0 (g/dL)    RDW 21.3 (*) 08.6 - 15.5 (%)    Platelets 6 (*) 150 - 400 (K/uL)   DIFFERENTIAL     Status: Abnormal   Collection Time   10/13/11  2:16 AM      Component Value Range Comment   Neutrophils Relative 0 (*) 43 - 77 (%)    Lymphocytes Relative 0 (*) 12 - 46 (%)    Monocytes Relative 0 (*) 3 - 12 (%)    Eosinophils Relative 0  0 - 5 (%)     Basophils Relative 0  0 - 1 (%)    Band Neutrophils 0  0 - 10 (%)    nRBC 0  0 (/100 WBC)    Lymphs Abs 0.0 (*) 0.7 - 4.0 (K/uL)    Monocytes Absolute 0.0 (*) 0.1 - 1.0 (K/uL)    Eosinophils Absolute 0.0  0.0 - 0.7 (K/uL)    Basophils Absolute 0.0  0.0 - 0.1 (K/uL)    WBC Morphology TOO FEW TO COUNT, SMEAR AVAILABLE FOR REVIEW     POCT I-STAT, CHEM 8     Status: Abnormal   Collection Time   10/13/11  2:23 AM      Component Value Range Comment   Sodium 143  135 - 145 (mEq/L)    Potassium 5.2 (*) 3.5 - 5.1 (mEq/L)    Chloride 118 (*) 96 - 112 (mEq/L)    BUN 56 (*) 6 - 23 (mg/dL)    Creatinine, Ser 5.78 (*) 0.50 - 1.10 (mg/dL)    Glucose, Bld 469 (*) 70 - 99 (mg/dL)    Calcium, Ion 6.29  1.12 - 1.32 (mmol/L)    TCO2 18  0 - 100 (mmol/L)    Hemoglobin 7.5 (*) 12.0 - 15.0 (g/dL)    HCT 52.8 (*) 41.3 - 46.0 (%)    Dg Chest 2 View  10/13/2011  *RADIOLOGY REPORT*  Clinical Data: The patient fell and hurts all over.  CHEST - 2 VIEW  Comparison: 08/02/2011  Findings: Emphysema with scattered fibrosis in the lungs.  Mild cardiac enlargement with prominent pulmonary vascularity and interstitial changes suggesting edema.  This is progressing since the previous study.  Atelectasis or fluid  in the minor fissure. This is increased.  No blunting of costophrenic angles.  No pneumothorax.  Calcified and tortuous aorta.  Healing fracture of the right lateral fifth rib.  IMPRESSION: Chronic emphysematous changes and fibrosis in the lungs.  Cardiac enlargement with suggestion of developing pulmonary vascular congestion and edema.  Healing right rib fracture.  Original Report Authenticated By: Marlon Pel, M.D.   Dg Lumbar Spine Complete  10/13/2011  *RADIOLOGY REPORT*  Clinical Data: The patient fell and hurt all over.  LUMBAR SPINE - COMPLETE 4+ VIEW  Comparison: 09/02/2011  Findings: Five lumbar type vertebra.  Scoliosis convex towards the left.  No abnormal anterior subluxation.  Diffuse bone  demineralization.  Biconcave endplates at L4, stable since previous study suggesting chronic compression.  No acute compression deformities demonstrated.  Degenerative changes throughout the lumbar spine with narrowed lumbar interspaces and associated endplate hypertrophic changes.  No focal bone lesion or bone destruction.  Vascular calcifications.  Stable appearance since previous study.  IMPRESSION: Degenerative changes, scoliosis, and old L4 compression.  No significant change since previous study.  Original Report Authenticated By: Marlon Pel, M.D.   Dg Pelvis 1-2 Views  10/12/2011  *RADIOLOGY REPORT*  Clinical Data: Right hip pain after fall today.  PELVIS - 1-2 VIEW  Comparison: Abdomen 10/28/2009  Findings: Postoperative changes with right total hip arthroplasty and internal fixation of the intertrochanteric healed fracture of the left hip.  There is lucency at the bone cement interface of the femoral component of the right hip arthroplasty but this appears stable since the prior study.  Chronic loosening is not excluded. No acute fracture or subluxation is demonstrated.  Visualized pelvis and sacral struts appear intact.  Calcified phleboliths in the pelvis.  Diffuse bone demineralization.  IMPRESSION: Postoperative changes in both hips.  No evidence of acute fracture or subluxation.  Original Report Authenticated By: Marlon Pel, M.D.   Dg Shoulder Right  10/12/2011  *RADIOLOGY REPORT*  Clinical Data: Right shoulder pain after fall.  RIGHT SHOULDER - 2+ VIEW  Comparison: 08/02/2011  Findings: The right shoulder appears intact.  No evidence of acute fracture or subluxation.  Decreased sub acromial space may represent chronic rotator cuff arthropathy.  Incidental note of fibrosis in the lungs.  No significant change since previous study.  IMPRESSION: No acute bony abnormalities.  Original Report Authenticated By: Marlon Pel, M.D.   Dg Hand Complete Left  10/13/2011  *RADIOLOGY  REPORT*  Clinical Data: Left hand swelling after fall.  LEFT HAND - COMPLETE 3+ VIEW  Comparison: 05/03/2004  Findings: Diffuse bone demineralization.  Degenerative changes in the left hand and wrist involving the radial carpal, STT, first carpometacarpal, first metacarpal phalangeal, and multiple interphalangeal joints.  There is deformity of the trapezium which is chronic. Small calcifications project over the medial dorsal aspect of the wrist may represent cartilaginous calcification or small avulsion fragments.  Degenerative changes have progressed since the previous study.  No evidence of acute fracture or subluxation.  IMPRESSION: Degenerative changes throughout the left hand and wrist, progressing since the previous study.  Calcification over the dorsum of the wrist which might represent avulsion fractures or cartilaginous calcification.  Original Report Authenticated By: Marlon Pel, M.D.    Impression Present on Admission:  .MYELODYSPLASTIC SYNDROME .Fall .Acute on chronic renal insufficiency .Hyperkalemia .Sundowning   86yoF with h/o end stage MDS, renal cell ca s/p RFA, systolic HF, and a  host of other medical problems, on home hospice who presents with  a  fall.   1. Fall: As below, noted to have very poor prognosis, per Dr. Clelia Croft was  on home hospice. This admission requested for inability to ambulate, ?  placement vs home, and sundowning. No obvious serious injury by plain  films. Therefore, overall will try to mobilize and assess home needs,  control pain.  - Admit observation level, PT consult. Scheduled tylenol.   2. MDS, pancytopenia: Per Dr. Alver Fisher prior recs as noted in [facility],  transfuse plts only for active bleeding and Hgb < 7 and symptomatic. Do  not feel pt warrants transfusion at this time, Hgb and plts quite stable  and except diffuse ecchymoses and some scleral hermorrhage, no evidence  of significant bleeding. Overall noted to be very poor prognosis, on   home hospice.   3. Mental status: The pt appears to be sundowning and is currently  minimally aggressive and very resistant. Delirium/sundowning is noted on  last d/c summary in 07/2011, and PHx lists anxiety, so this may be some  dementia, possibly some delirium on dementia, but I tend to doubt due to  any serious medical etiology at present.  - Low dose, minimal PO or IV haldol, try to minimize tethers and late  night vitals  4. Acute on chronic renal insufficiency, hyperK: Cr 2.6 is above prior  baseline 1.9-2.2. Will give some IVF's and kayexalate x1 if pt will  allow and trend  5. Continue zoloft, amlodipine, bisoprolol, hydralazine, methenamine,  felodipine ( ? also on amlodipine), uloric, folate   DVT prophy: early ambulation. Avoiding hep/LMWH due to fall/bruising and  SCD's due to tethering.  Regular bed, [facility] team 4 Code status, not able to be discussed, however given home hospice per  Dr. Alver Fisher note, I will make her DNR/DNI as these maneuvers do not  seem medically  indicated. This should be confirmed.   Other plans as per orders.  Dru Laurel 10/13/2011, 4:49 AM

## 2011-10-13 NOTE — ED Notes (Signed)
Patient still refuse to have blood drawn

## 2011-10-13 NOTE — Progress Notes (Signed)
Patient has manual blood pressure of 210/68. Notification to attending in progress. RN awaiting call back.

## 2011-10-13 NOTE — Significant Event (Signed)
[name] Wellsite geologist notified of patient's refusal of meds, labs and vital signs.

## 2011-10-13 NOTE — Progress Notes (Addendum)
Room 1504 - [name]    PMT-Palliative Medicine Team - RN Liaison Visit  PMT consult ordered for GOC.  Discussed with patient who is slow to respond and her friend [name] [phone].  Friend does admit patient has son in [place], [place] and [name] will attempt to get in touch with him to be at the meeting.   Scheduled for Thursday 5/16 @ 11:00am.   Please call PMT phone @ [phone] with any questions.  Thank you. Chalmers Cater, RN Palliative Medicine Team RN Liaison [phone]

## 2011-10-13 NOTE — ED Notes (Addendum)
Patient complain of pain and Earley Favor, NP notified and new orders received.

## 2011-10-13 NOTE — ED Notes (Signed)
78 year old female presents status post mechanical fall complaining of pain which she is unable to pinpoint. She denies any leg pain, has some pain in her shoulders - but is unable to ambulate secondary to pain.  Physical exam shows that her abdomen is soft, there is no peripheral edema or obvious deformity of her extremities, has clear heart sounds, intermittent rales with breathing but no respiratory distress. Vital signs show mild hypertension, patient is unable to ambulate with assistance.  Assessment, patient with mechanical fall now with pain that prevents ambulation or taking care of herself at home, will require temporary placement and admission for pain control and physical therapy.  Medical screening examination/treatment/procedure(s) were conducted as a shared visit with non-physician practitioner(s) and myself.  I personally evaluated the patient during the encounter    Vida Roller, MD 10/13/11 [phone]

## 2011-10-13 NOTE — Progress Notes (Signed)
Patient is not on home hospice Will consult Dr.Shadad to determine prognosis  Continue IV hydration Monitor CBC PT OT eval again She declined SNIF during her last admission Apparently does not have any family members that can help make a decision

## 2011-10-14 DIAGNOSIS — N179 Acute kidney failure, unspecified: Secondary | ICD-10-CM

## 2011-10-14 DIAGNOSIS — E119 Type 2 diabetes mellitus without complications: Secondary | ICD-10-CM

## 2011-10-14 DIAGNOSIS — D46Z Other myelodysplastic syndromes: Secondary | ICD-10-CM

## 2011-10-14 DIAGNOSIS — F0390 Unspecified dementia without behavioral disturbance: Secondary | ICD-10-CM

## 2011-10-14 DIAGNOSIS — F05 Delirium due to known physiological condition: Secondary | ICD-10-CM

## 2011-10-14 MED ORDER — INSULIN ASPART 100 UNIT/ML ~~LOC~~ SOLN: 0.0000 [IU] | Freq: Every day | SUBCUTANEOUS | Status: DC

## 2011-10-14 MED ORDER — INSULIN ASPART 100 UNIT/ML ~~LOC~~ SOLN
0.0000 [IU] | Freq: Three times a day (TID) | SUBCUTANEOUS | Status: DC
  Administered 2011-10-15: 1 [IU] via SUBCUTANEOUS

## 2011-10-14 MED ORDER — HYDROCOD POLST-CHLORPHEN POLST 10-8 MG/5ML PO LQCR
5.0000 mL | Freq: Two times a day (BID) | ORAL | Status: DC | PRN
  Administered 2011-10-14 – 2011-10-15 (×2): 5 mL via ORAL
  Filled 2011-10-14 (×2): qty 5

## 2011-10-14 NOTE — Progress Notes (Signed)
   CARE MANAGEMENT NOTE 10/14/2011  Patient:  ARDELIA, WREDE   Account Number:  [id number]  Date Initiated:  10/13/2011  Documentation initiated by:  DAVIS,RHONDA  Subjective/Objective Assessment:   pt admitted through er after reported fall.     Action/Plan:   lives at home alone   Anticipated DC Date:  10/15/2011   Anticipated DC Plan:  [facility]  In-house referral  Clinical Social Worker      DC Planning Services  CM consult      Choice offered to / List presented to:  NA      DME agency  NA     HH arranged  NA      HH agency  NA   Status of service:  In process, will continue to follow Medicare Important Message given?   (If response is "NO", the following Medicare IM given date fields will be blank) Date Medicare IM given:   Date Additional Medicare IM given:    Discharge Disposition:    Per UR Regulation:  Reviewed for med. necessity/level of care/duration of stay  If discussed at Long Length of Stay Meetings, dates discussed:    Comments:  10-14-11 Raiford Noble, RN,BSN,CM [phone] GOC meeting sch for tomorrow 10-15-11 at 11am per PMT RN notes. Likely dc to [facility]. Will cont to follow along with CSW.    [id number]/[facility] was refered in March of this year but patient refused care or assistance, lives alone, last admission for medical was from a fall in [id number] Dr. Clelia Croft [facility]. Rhonda Davis,RN,BSN,CCM

## 2011-10-14 NOTE — Evaluation (Signed)
Occupational Therapy Evaluation Patient Details Name: [name] MRN: [id number] DOB: June 01, 1926 Today's Date: 10/14/2011 Time: 4098-1191 OT Time Calculation (min): 32 min  OT Assessment / Plan / Recommendation Clinical Impression  Pt admitted after falling and injurying R hip and L wrist.  L wrist with possible avulsion fx and splinted. Pt has end stage MDS and low HGB.  Will follow acutely.  Pt states her "adopted son, [name]" is arranging 24 hour care for her at home.  Pt does not want SNF.  Noted possibility of [facility].    OT Assessment  Patient needs continued OT Services    Follow Up Recommendations  Supervision/Assistance - 24 hour;Skilled nursing facility;Other (comment) (vs. hospice)    Barriers to Discharge Other (comment) (lives alone, but states she will have 24 hour care arranged)    Equipment Recommendations  None recommended by PT        Frequency  Min 2X/week    Precautions / Restrictions Precautions Precautions: Fall Required Braces or Orthoses: Other Brace/Splint Other Brace/Splint: splint l wrist-attempted to limit amount of wbing through wrist with use of walker Restrictions Weight Bearing Restrictions: No Other Position/Activity Restrictions: wrist splint on L   Pertinent Vitals/Pain     ADL  Eating/Feeding: Simulated;Set up Where Assessed - Eating/Feeding: Chair Grooming: Performed;Teeth care;Set up Where Assessed - Grooming: Supine, head of bed up Upper Body Bathing: Simulated;Minimal assistance Where Assessed - Upper Body Bathing: Sitting, bed Lower Body Bathing: Simulated;Moderate assistance Where Assessed - Lower Body Bathing: Sitting, bed;Sit to stand from bed Upper Body Dressing: Simulated;Minimal assistance Where Assessed - Upper Body Dressing: Sitting, bed Lower Body Dressing: Simulated;Moderate assistance Where Assessed - Lower Body Dressing: Sitting, bed;Sit to stand from bed Toilet Transfer: Performed;Minimal assistance Toilet  Transfer Method: Ambulating Toilet Transfer Equipment: Bedside commode Toileting - Hygiene: Performed;Supervision/safety Where Assessed - Toileting Hygiene: Sit on 3-in-1 or toilet Equipment Used: Rolling walker Ambulation Related to ADLs: min assist with RW from bed to 3 in 1 to chair ADL Comments: Pt limited by generalized weakness.    OT Diagnosis: Generalized weakness;Acute pain  OT Problem List: Decreased strength;Decreased activity tolerance;Impaired balance (sitting and/or standing);Pain;Impaired UE functional use;Decreased safety awareness OT Treatment Interventions: Self-care/ADL training;Patient/family education   OT Goals Acute Rehab OT Goals OT Goal Formulation: With patient Time For Goal Achievement: 10/28/11 Potential to Achieve Goals: Fair ADL Goals Pt Will Perform Grooming: with supervision;Other (comment);Sitting, chair (3 activities) ADL Goal: Grooming - Progress: Goal set today Pt Will Perform Upper Body Bathing: with supervision;Sitting, edge of bed ADL Goal: Upper Body Bathing - Progress: Goal set today Pt Will Perform Lower Body Bathing: with min assist;Sitting, edge of bed;Sit to stand from bed ADL Goal: Lower Body Bathing - Progress: Goal set today Pt Will Perform Upper Body Dressing: with supervision;Sitting, bed ADL Goal: Upper Body Dressing - Progress: Goal set today Pt Will Perform Lower Body Dressing: with min assist;Sit to stand from bed;Sitting, bed ADL Goal: Lower Body Dressing - Progress: Goal set today Pt Will Transfer to Toilet: with supervision;Ambulation;3-in-1;with cueing (comment type and amount) (to avoid use of L wrist) ADL Goal: Toilet Transfer - Progress: Goal set today Pt Will Perform Toileting - Clothing Manipulation: with supervision;Standing ADL Goal: Toileting - Clothing Manipulation - Progress: Goal set today  Visit Information  Last OT Received On: 10/14/11 Assistance Needed: +1 PT/OT Co-Evaluation/Treatment: Yes    Subjective  Data  Subjective: "I fell, honey." Patient Stated Goal: Home with 24 hour care.  Pt adamantly refusing SNF.  Prior Functioning  Home Living Lives With: Alone Available Help at Discharge: Friend(s) Type of Home: House Home Access: Ramped entrance Home Layout: One level Bathroom Shower/Tub: Health visitor: Handicapped height Bathroom Accessibility: Yes How Accessible: Accessible via walker Home Adaptive Equipment: Grab bars in shower;Built-in shower seat;Straight cane;Walker - rolling;Bedside commode/3-in-1;Wheelchair - manual Prior Function Level of Independence: Needs assistance;Independent with assistive device(s) Needs Assistance: Light Housekeeping Light Housekeeping: Total Driving: No Communication Communication: No difficulties;Other (comment) (speaks slowly) Dominant Hand: Right    Cognition  Overall Cognitive Status: Appears within functional limits for tasks assessed/performed Arousal/Alertness: Awake/alert Orientation Level: Appears intact for tasks assessed Behavior During Session: [facility] for tasks performed    Extremity/Trunk Assessment Right Upper Extremity Assessment RUE ROM/Strength/Tone: [facility] for tasks assessed (pt with arthritic changes in hands) RUE Coordination: WFL - gross/fine motor Left Upper Extremity Assessment LUE ROM/Strength/Tone: Deficits (pt with wrist splint in place due to fx and increased edema.) LUE Coordination: WFL - gross motor Right Lower Extremity Assessment RLE ROM/Strength/Tone: Deficits;Unable to fully assess RLE ROM/Strength/Tone Deficits: Strength at least 3+/5 during functional activity RLE Coordination: WFL - gross motor Left Lower Extremity Assessment LLE ROM/Strength/Tone: Deficits;Unable to fully assess LLE ROM/Strength/Tone Deficits: Strength at least 3+/5 during functional activity LLE Coordination: WFL - gross motor   Mobility Bed Mobility Bed Mobility: Supine to Sit Supine to Sit: 3: Mod assist;HOB  elevated Sitting - Scoot to Edge of Bed: 3: Mod assist Details for Bed Mobility Assistance: Assist for bil LEs off bed. Utilized bedpad to assist with scooting, positioning. Increased time. Cues for technique Transfers Transfers: Sit to Stand;Stand to Sit Sit to Stand: 4: Min assist;With upper extremity assist;From bed;From chair/3-in-1 Stand to Sit: 4: Min assist;To chair/3-in-1;With upper extremity assist Details for Transfer Assistance: VCs safety, technique, hand placement. Assist to rise, stabilize, control descent. Increased time. Assist to navigate with RW       Balance Balance Balance Assessed: Yes Static Sitting Balance Static Sitting - Balance Support: Feet supported Static Sitting - Level of Assistance: 7: Independent  End of Session OT - End of Session Activity Tolerance: Patient limited by fatigue Patient left: in chair;with call bell/phone within reach   Evern Bio 10/14/2011, 12:09 PM [phone]

## 2011-10-14 NOTE — Evaluation (Signed)
Physical Therapy Evaluation Patient Details Name: [name] MRN: [id number] DOB: 02/24/1926 Today's Date: 10/14/2011 Time: 4098-1191 PT Time Calculation (min): 34 min  PT Assessment / Plan / Recommendation Clinical Impression  Pt presents with diagnosis of fall, low HGB. Pt with history of myelodysplastic syndrome. Pt is currently demonstrating general deconditioning and requires +1 assist for mobility. Mobility is limited by pain and weakness. Pt lives alone. If pt discharges home, recommend HHPT with 24 hour supervision. Did note possibility of d/c to [facility].     PT Assessment  Patient needs continued PT services    Follow Up Recommendations  Home health PT;Supervision/Assistance - 24 hour vs ????Hospice (noted mention of [facility] in chart)   Barriers to Discharge        lEquipment Recommendations  None recommended by PT    Recommendations for Other Services OT consult (already on board)   Frequency Min 3X/week    Precautions / Restrictions Precautions Precautions: Fall Required Braces or Orthoses: Other Brace/Splint Other Brace/Splint: splint l wrist-attempted to limit amount of wbing through wrist with use of walker Restrictions Weight Bearing Restrictions: No   Pertinent Vitals/Pain       Mobility  Bed Mobility Bed Mobility: Supine to Sit Supine to Sit: 3: Mod assist;HOB elevated Details for Bed Mobility Assistance: Assist for bil LEs off bed. Utilized bedpad to assist with scooting, positioning. Increased time. Cues for technique Transfers Transfers: Sit to Stand;Stand to Sit;Stand Pivot Transfers Sit to Stand: 4: Min assist;With upper extremity assist;From bed;From chair/3-in-1 Stand to Sit: 4: Min assist;To chair/3-in-1;With upper extremity assist Stand Pivot Transfers: 4: Min assist Details for Transfer Assistance: VCs safety, technique, hand placement. Assist to rise, stabilize, control descent. Increased time. Assist to navigate with  RW Ambulation/Gait Ambulation/Gait Assistance: 4: Min assist Ambulation Distance (Feet): 3 Feet Assistive device: Rolling walker Ambulation/Gait Assistance Details: VCs safety. Increased time. Assist to stabilize and navigate with RW. Fatigues very easily.  Gait Pattern: Step-through pattern;Antalgic;Decreased step length - right;Decreased step length - left;Decreased stride length    Exercises     PT Diagnosis: Difficulty walking;Generalized weakness;Acute pain  PT Problem List: Decreased strength;Decreased activity tolerance;Decreased mobility;Pain;Decreased safety awareness PT Treatment Interventions: DME instruction;Gait training;Functional mobility training;Therapeutic activities;Therapeutic exercise;Patient/family education   PT Goals Acute Rehab PT Goals PT Goal Formulation: With patient Time For Goal Achievement: 10/28/11 Potential to Achieve Goals: Fair Pt will go Supine/Side to Sit: with supervision PT Goal: Supine/Side to Sit - Progress: Goal set today Pt will go Sit to Supine/Side: with supervision PT Goal: Sit to Supine/Side - Progress: Goal set today Pt will go Sit to Stand: with supervision PT Goal: Sit to Stand - Progress: Goal set today Pt will Ambulate: 16 - 50 feet;with rolling walker;with supervision PT Goal: Ambulate - Progress: Goal set today  Visit Information  Last PT Received On: 10/14/11 Assistance Needed: +1    Subjective Data  Subjective: "I know honey...." Patient Stated Goal: Home   Prior Functioning  Home Living Lives With: Alone Available Help at Discharge: Friend(s) Type of Home: House Home Access: Ramped entrance Home Layout: One level Bathroom Shower/Tub: Health visitor: Handicapped height Bathroom Accessibility: Yes How Accessible: Accessible via walker Home Adaptive Equipment: Grab bars in shower;Built-in shower seat;Straight cane;Walker - rolling;Bedside commode/3-in-1;Wheelchair - manual Prior Function Level of  Independence: Needs assistance;Independent with assistive device(s) Needs Assistance: Light Housekeeping    Cognition  Overall Cognitive Status: Appears within functional limits for tasks assessed/performed Arousal/Alertness: Awake/alert Orientation Level: Appears intact  for tasks assessed Behavior During Session: [facility] for tasks performed    Extremity/Trunk Assessment Right Lower Extremity Assessment RLE ROM/Strength/Tone: Deficits;Unable to fully assess RLE ROM/Strength/Tone Deficits: Strength at least 3+/5 during functional activity RLE Coordination: WFL - gross motor Left Lower Extremity Assessment LLE ROM/Strength/Tone: Deficits;Unable to fully assess LLE ROM/Strength/Tone Deficits: Strength at least 3+/5 during functional activity LLE Coordination: WFL - gross motor   Balance    End of Session PT - End of Session Activity Tolerance: Patient limited by pain;Patient limited by fatigue Patient left: in chair;with call bell/phone within reach   Rebeca Alert [facility] 10/14/2011, 11:57 AM [phone]

## 2011-10-14 NOTE — Progress Notes (Signed)
Patient ID: [name], female   DOB: [birth year], 78 y.o.   MRN: [id number]  Assessment and Plan:  Principal Problem:  * FALL - based on x ray findings no fractures identified in pelvis; there is an old L4 compression but no change since prior studies - per PT evaluation - home health PT versus hospice placement - per palliative care - plan to meet with family 10/15/2011 to discuss goals of care  Active Problems:  Myelodysplastic syndrome - per oncology recommendation patient is becoming transfusion refractory and in favour of stopping transfusion and placement at [facility] if possible.  Acute on chronic kidney disease, stage IV - creatinine worse than patient's baseline (1.9 - 2.2) - creatinine is trending down from 2.60 to 2.38 - perhaps this is secondary to prerenal etiology / dehydration - continue to monitor  Hypertension - BP 140/59 - continue norvasc and bisoprolol  Anemia of chronic disease - secondary to combination of chronic kidney disease and myelodysplastic syndrome - hemoglobin 7.5 today - will continue to monitor as this hemoglobin level has actually trended up from admission (hemoglobin 5.7)  Thrombocytopenia - secondary to MDS - per oncology recommendation would recommend stopping transfusion  Hyperglycemia  -perhaps stress related - start sliding scale insulin - CBG monitoring  Advance directives - DNR  Education - no family available at bedside - I have explained to a patient about this oncology recommendation and although she has verbalized her understanding I am not quite sure if she really did due to her mental status. She is oriented to self but not place or date.  Consults to date: 1. Oncology (Dr. Clelia Croft) 2. Hospice referral 3. Physical Therapy  Subjective: No events overnight. Patient denies chest pain, shortness of breath, abdominal pain. Had 1 BM yesterday.  Objective:  Vital signs in last 24 hours:   10/14/11 0610 10/14/11 1415   BP: 169/78 140/59  Pulse: 76 58  Temp: 98.6 F (37 C) 98.7 F (37.1 C)  TempSrc: Oral Oral  Resp: 18 16  SpO2: 93% 94%    Intake/Output from previous day:  Intake/Output Summary (Last 24 hours) at 10/14/11 1646 Last data filed at 10/14/11 1300  Gross per 24 hour  Intake   2135 ml  Output    100 ml  Net   2035 ml    Physical Exam: General: Alert, awake, in no acute distress. HEENT: No bruits, no goiter. dry mucous membranes, no scleral icterus, no conjunctival pallor. Heart: Regular rate and rhythm, S1/S2 +, no murmurs, rubs, gallops. Lungs: coarse breath sounds, bilateral air entry Abdomen: Soft, nontender, nondistended, positive bowel sounds. Extremities: right hand cyanosis; pulses faintly palpable over right upper extremity Neuro: Grossly nonfocal.  Lab Results:  Lab November 06, 2011 0223 2011-11-06 0216 10/09/11 1008  WBC -- 0.5* 0.5*  HGB 7.5* 7.6* 5.7*  HCT 22.0* 22.4* 17.3*  PLT -- 6* 4*  MCV -- 87.8 88.7    Lab 11-06-2011 1615 11-06-2011 0223  NA 142 143  K 4.9 5.2*  CL 114* 118*  CO2 19 --  GLUCOSE 126* 137*  BUN 57* 56*  CREATININE 2.38* 2.60*  CALCIUM 8.1* --    Studies/Results: Dg Chest 2 View 11/06/11   IMPRESSION: Chronic emphysematous changes and fibrosis in the lungs.  Cardiac enlargement with suggestion of developing pulmonary vascular congestion and edema.  Healing right rib fracture.    Dg Lumbar Spine Complete 2011-11-06   IMPRESSION: Degenerative changes, scoliosis, and old L4 compression.  No significant change since previous  study.    Dg Pelvis 1-2 Views 10/12/2011   IMPRESSION: Postoperative changes in both hips.  No evidence of acute fracture or subluxation.    Dg Shoulder Right 10/12/2011   IMPRESSION: No acute bony abnormalities.    Dg Hand Complete Left 10/13/2011   IMPRESSION: Degenerative changes throughout the left hand and wrist, progressing since the previous study.  Calcification over the dorsum of the wrist which might represent  avulsion fractures or cartilaginous calcification.     Medications: Scheduled Meds:   . acetaminophen  650 mg Oral Q6H   Or  . acetaminophen  650 mg Rectal Q6H  . amLODipine  10 mg Oral Daily  . bisoprolol  10 mg Oral Daily  . docusate sodium  100 mg Oral BID  . febuxostat  40 mg Oral Daily  . felodipine  10 mg Oral Daily  . folic acid  1 mg Oral Daily  . hydrALAZINE  50 mg Oral TID  . methenamine  1,000 mg Oral BID  . senna  1 tablet Oral BID  . sertraline  25 mg Oral Daily   Continuous Infusions:   . sodium chloride     PRN Meds:.chlorpheniramine-HYDROcodone, haloperidol, hydrALAZINE, morphine injection, ondansetron (ZOFRAN) IV, ondansetron, polyethylene glycol    LOS: 2 days   Johntay Doolen 10/14/2011, 4:46 PM  [facility] Pager: [phone]

## 2011-10-15 ENCOUNTER — Telehealth: Payer: Self-pay | Admitting: *Deleted

## 2011-10-15 DIAGNOSIS — F05 Delirium due to known physiological condition: Secondary | ICD-10-CM

## 2011-10-15 DIAGNOSIS — N179 Acute kidney failure, unspecified: Secondary | ICD-10-CM

## 2011-10-15 DIAGNOSIS — E119 Type 2 diabetes mellitus without complications: Secondary | ICD-10-CM

## 2011-10-15 DIAGNOSIS — R5383 Other fatigue: Secondary | ICD-10-CM

## 2011-10-15 DIAGNOSIS — F0390 Unspecified dementia without behavioral disturbance: Secondary | ICD-10-CM

## 2011-10-15 DIAGNOSIS — D46Z Other myelodysplastic syndromes: Secondary | ICD-10-CM

## 2011-10-15 DIAGNOSIS — R5381 Other malaise: Secondary | ICD-10-CM

## 2011-10-15 DIAGNOSIS — R627 Adult failure to thrive: Secondary | ICD-10-CM

## 2011-10-15 LAB — CBC
HCT: 15.7 % — ABNORMAL LOW (ref 36.0–46.0)
MCH: 29.8 pg (ref 26.0–34.0)
MCHC: 33.8 g/dL (ref 30.0–36.0)
RDW: 16 % — ABNORMAL HIGH (ref 11.5–15.5)

## 2011-10-15 LAB — BASIC METABOLIC PANEL
BUN: 55 mg/dL — ABNORMAL HIGH (ref 6–23)
Calcium: 8.3 mg/dL — ABNORMAL LOW (ref 8.4–10.5)
Creatinine, Ser: 2.42 mg/dL — ABNORMAL HIGH (ref 0.50–1.10)
GFR calc Af Amer: 20 mL/min — ABNORMAL LOW (ref >90)
GFR calc non Af Amer: 17 mL/min — ABNORMAL LOW (ref >90)
Potassium: 4.4 mEq/L (ref 3.5–5.1)

## 2011-10-15 LAB — GLUCOSE, CAPILLARY

## 2011-10-15 MED ORDER — LORAZEPAM 2 MG/ML IJ SOLN: 0.5000 mg | INTRAMUSCULAR | Status: DC | PRN

## 2011-10-15 MED ORDER — MORPHINE SULFATE 2 MG/ML IJ SOLN
1.0000 mg | INTRAMUSCULAR | Status: DC | PRN
  Administered 2011-10-17: 1 mg via INTRAVENOUS
  Filled 2011-10-15 (×2): qty 1

## 2011-10-15 MED ORDER — ATROPINE SULFATE 1 % OP SOLN: 2.0000 [drp] | OPHTHALMIC | Status: DC | PRN

## 2011-10-15 NOTE — Consult Note (Signed)
Consult Note from the Palliative Medicine Team at [facility] Patient ZO:XWRU VENETTA KNEE      DOB: 1926-05-14      EAV:[id number]   Consult Requested by: Dr Richarda Overlie                PCP: Judie Petit, MD, MD Reason for Consultation:Goals of Care/Symptom Mangement    Phone [phone]  Prior to Goals of Care meeting reviewed chart and spoke with staff caring for the patient. In attendance during meeting were the patient,    Gustavus Messing friend/caregiver, Nino Parsley [phone]) and Rosanne Gutting (daug-in-law). We discussed the trajectory of patients MDS status based on Dr Alver Fisher (Oncology) clinical opinion that patient has become refractory to transfusions, and that hospice care would be an appropriate option at this point. Patients understands and family is agreeable for no further blood product transfusions.   We discussed the importance of maintaining patient dignity and quality of life, in a safe and comfortable environment. Talked about the services hospice delivers and the need to provide comfort and symptom management at this point.   Patient was of clear mentation, she was oriented to person,place and time. Initially she was very adamant about wanting to go back home, and clearly stated she did not want to go to SNF or residential hospice at this point. She stated she understood that blood transfusion intervention was no longer a therapeutic option at this point, and that without transfusion support she will die within the next several weeks.  Offered the option of going to son's home with home hospice services, son and daughter-in-law were were agreeable and expressed their desire and capability to care for her. They also have 2 older children that could assist with her care.   Assessment and Plan: 1. Code Status:DNR/DNI 2. Symptom Control:       Dyspnea/Pain: denies any pain at present, respirations comfortable. If dyspnea occurs prior to discharge would consider  adding Roxinol 5-10 mg every 4-6 hours, and also discharge with 30 ml of this medication.        3. Psycho/Social:Supported families decision, also provided emotional support 4. Spiritual: will consult chaplin for a visit with patient 5. Disposition: social work consulted to offer hospice choice, plan is for disposition to son's home with hospice support  Patient Documents Completed or Given: Document Given Completed  Advanced Directives Pkt    MOST        YES  DNR        YES  Gone from My Sight    Hard Choices      Brief HPI: Mrs. Rainone is an 78 year old, that has has several admission over the past several months related to her anemia and  falls at home. She was recently admitted 10/12/11 for injuries sustained from fall at home. She also has a diagnosis of MDS and has been receiving transfusions at least every 2 weeks for past 6 months. Unfortunately per Dr Clelia Croft she has reached a point where she is now refractory and receives little if any benefit from blood product  transfusion intervention.    ROS: Patient denies pain, constipation, c/p, anxiety, or air hunger    PMH:  Past Medical History  Diagnosis Date  . Acquired pancytopenia   . Hypertension   . Depression   . Osteoporosis   . HLD (hyperlipidemia)   . Fracture, hip     bilateral  . CVA (cerebral infarction)   . Chronic kidney disease   .  Stricture esophagus   . Glaucoma   . CHF (congestive heart failure)   . Systolic dysfunction   . Raynaud disease   . Hyperhomocysteinemia   . Lymphocytic gastritis   . Anxiety   . DJD (degenerative joint disease)   . Gastric ulcer   . Hx of colonic polyps   . C. difficile colitis   . Diverticulosis   . Gout   . GERD (gastroesophageal reflux disease)   . Anxiety and depression   . Anemia   . Torn rotator cuff     right shoulder  . Renal cell carcinoma     S/p RFA  . Myelodysplastic syndrome     End stage, followed by Dr. Clelia Croft, supportive with transfusions for Hgb <  7 or plts for active bleeding     PSH: Past Surgical History  Procedure Date  . Abdominal hysterectomy   . Cataract extraction   . Varicose vein surgery   . Tubal ligation   . Hip fracture surgery      bilateral  . Spine surgery   . Kidney surgery     for renal carcinoma   I have reviewed the FH and SH and  If appropriate update it with new information. Allergies  Allergen Reactions  . Clonidine Hydrochloride     REACTION: Reaction per patient, can't take.  Also references hive on forehead \\T \ spts on chest from other meds  . Doxazosin Mesylate     REACTION: holding due to itching  . Doxycycline Hyclate     REACTION: rash  . Erythromycin Ethylsuccinate     REACTION: unspecified  . Meperidine Hcl     REACTION: unspecified  . Penicillins     REACTION: unspecified  . Sulfonamide Derivatives     REACTION: unspecified   Scheduled Meds:   . acetaminophen  650 mg Oral Q6H   Or  . acetaminophen  650 mg Rectal Q6H  . amLODipine  10 mg Oral Daily  . bisoprolol  10 mg Oral Daily  . docusate sodium  100 mg Oral BID  . febuxostat  40 mg Oral Daily  . felodipine  10 mg Oral Daily  . folic acid  1 mg Oral Daily  . hydrALAZINE  50 mg Oral TID  . insulin aspart  0-5 Units Subcutaneous QHS  . insulin aspart  0-9 Units Subcutaneous TID WC  . methenamine  1,000 mg Oral BID  . senna  1 tablet Oral BID  . sertraline  25 mg Oral Daily   Continuous Infusions:   . sodium chloride     PRN Meds:.chlorpheniramine-HYDROcodone, haloperidol, hydrALAZINE, morphine injection, ondansetron (ZOFRAN) IV, ondansetron, polyethylene glycol    BP 110/70  Pulse 58  Temp(Src) 98.1 F (36.7 C) (Oral)  Resp 18  Ht 5\' 3"  (1.6 m)  Wt 57.153 kg (126 lb)  BMI 22.32 kg/m2  SpO2 95%   PPS:50%   Intake/Output Summary (Last 24 hours) at 10/15/11 1238 Last data filed at 10/15/11 0524  Gross per 24 hour  Intake   1306 ml  Output    100 ml  Net   1206 ml   LBM: prior to admission                      Stool Softner: [place]  Physical Exam:  General: Weak, pale appearing elderly female HEENT:  Conjunctival pallor, anicteric, buccal mucosa moist Chest:   CTA bilaterally, diminished at bases CVS: Bradycardic, rhythm regular Abdomen: Soft, non-tender, BS  audible ZOX:WRUE to touch, no edema or mottling  Neuro:alert/oriented x3, responses appropriate  Labs: CBC    Component Value Date/Time   WBC 0.5* 10/13/2011 0216   WBC 0.5* 10/09/2011 1008   RBC 2.55* 10/13/2011 0216   RBC 1.95* 10/09/2011 1008   HGB 7.5* 10/13/2011 0223   HGB 5.7* 10/09/2011 1008   HCT 22.0* 10/13/2011 0223   HCT 17.3* 10/09/2011 1008   PLT 6* 10/13/2011 0216   PLT 4* 10/09/2011 1008   MCV 87.8 10/13/2011 0216   MCV 88.7 10/09/2011 1008   MCH 29.8 10/13/2011 0216   MCH 29.2 10/09/2011 1008   MCHC 33.9 10/13/2011 0216   MCHC 32.9 10/09/2011 1008   RDW 16.2* 10/13/2011 0216   RDW 16.5* 10/09/2011 1008   LYMPHSABS 0.0* 10/13/2011 0216   LYMPHSABS 0.3* 10/09/2011 1008   MONOABS 0.0* 10/13/2011 0216   MONOABS 0.0* 10/09/2011 1008   EOSABS 0.0 10/13/2011 0216   EOSABS 0.0 10/09/2011 1008   BASOSABS 0.0 10/13/2011 0216   BASOSABS 0.0 10/09/2011 1008    BMET    Component Value Date/Time   NA 142 10/13/2011 1615   K 4.9 10/13/2011 1615   CL 114* 10/13/2011 1615   CO2 19 10/13/2011 1615   GLUCOSE 126* 10/13/2011 1615   BUN 57* 10/13/2011 1615   CREATININE 2.38* 10/13/2011 1615   CALCIUM 8.1* 10/13/2011 1615   GFRNONAA 18* 10/13/2011 1615   GFRAA 20* 10/13/2011 1615    CMP     Component Value Date/Time   NA 142 10/13/2011 1615   K 4.9 10/13/2011 1615   CL 114* 10/13/2011 1615   CO2 19 10/13/2011 1615   GLUCOSE 126* 10/13/2011 1615   BUN 57* 10/13/2011 1615   CREATININE 2.38* 10/13/2011 1615   CALCIUM 8.1* 10/13/2011 1615   PROT 5.3* 10/13/2011 1615   ALBUMIN 2.4* 10/13/2011 1615   AST 11 10/13/2011 1615   ALT 14 10/13/2011 1615   ALKPHOS 143* 10/13/2011 1615   BILITOT 0.3 10/13/2011 1615   GFRNONAA 18* 10/13/2011 1615    GFRAA 20* 10/13/2011 1615     Time In Time Out Total Time Spent with Patient Total Overall Time  11:00a 12:15p 90 min 90 min    Greater than 50%  of this time was spent counseling and coordinating care related to the above assessment and plan.   Freddie Breech, CNS-C Palliative Medicine Team [facility] Team Phone: [phone] Pager: [phone]

## 2011-10-15 NOTE — Progress Notes (Addendum)
Patient [name]      DOB: Aug 21, 1925      EAV:[id number]  After family meeting this morning. Lab values from CBC as follows:  WBC 0.5 H/H 5.7 17.3 Plt Ct <5   Spoke with Dr Elisabeth Pigeon she feels that a pallitive care floor transfer would be appropriate since family and patient were in agreement for no further transfusion support. Spoke with patient caregiver [name], and son Ron about critical lab values and implications to patients status, agreeable to Palliative unit transfer. Transfer orders placed, palliative care unit notified, spoke with Young Berry RN stated bed was available.

## 2011-10-15 NOTE — Progress Notes (Signed)
Patient ID: [name], female   DOB: 25-Apr-1926, 78 y.o.   MRN: [id number]  Assessment and Plan:   Principal Problem:   * FALL  - based on x ray findings no fractures identified in pelvis; there is an old L4 compression but no change since prior studies  - per PT evaluation - home health PT versus hospice placement  - per palliative care - family wishes to proceed with comfort care only  Active Problems:   Myelodysplastic syndrome  - per oncology recommendation patient is becoming transfusion refractory and in favour of stopping transfusion  - as per family no more blood draws and comfort care  Acute on chronic kidney disease, stage IV  - creatinine worse than patient's baseline (1.9 - 2.2)  - creatinine is 2.42 - perhaps this is secondary to prerenal etiology / dehydration   Hypertension  - BP 148/47 - continue norvasc and bisoprolol   Anemia of chronic disease  - secondary to combination of chronic kidney disease and myelodysplastic syndrome  - hemoglobin dropped to 5.3 again and per family no further transfusions and/or blood draws  Thrombocytopenia  - secondary to MDS  - per oncology recommendation would recommend stopping transfusion   Hyperglycemia  -perhaps stress related  - sliding scale insulin to be continued  Advance directives  - DNR   Education  - family agrees with comfort care and no more blood draws - anticipate discharge Sunday, home with home hospice  Disposition - transfer to palliative care floor  Consults to date:  1. Oncology (Dr. Clelia Croft)  2. Hospice referral  3. Physical Therapy   Subjective: No events overnight.   Objective:  Vital signs in last 24 hours:  Filed Vitals:   10/14/11 2300 10/15/11 0615 10/15/11 0917 10/15/11 1355  BP: 156/64 102/67 110/70 148/47  Pulse: 60 58  55  Temp: 97.6 F (36.4 C) 98.1 F (36.7 C)  98.6 F (37 C)  TempSrc: Oral Oral  Oral  Resp: 18 18  18   Height:      Weight:      SpO2: 93% 95%  97%     Intake/Output from previous day:   Intake/Output Summary (Last 24 hours) at 10/15/11 1648 Last data filed at 10/15/11 1325  Gross per 24 hour  Intake    806 ml  Output      0 ml  Net    806 ml    Physical Exam: General:  no acute distress. HEENT: No bruits, no goiter. Dry  mucous membranes Heart: Regular rate and rhythm, S1/S2 +, no murmurs, rubs, gallops. Lungs: Clear to auscultation bilaterally. No wheezing, no rhonchi, no rales.  Abdomen: Soft, nontender, nondistended, positive bowel sounds. Extremities: left hand cyanosis or ecchymotic area status post fall Neuro: Grossly nonfocal.  Lab Results:   Lab 10/15/11 1230 10/13/11 0223 10/13/11 0216 10/09/11 1008  WBC 0.5* -- 0.5* 0.5*  HGB 5.3* 7.5* 7.6* 5.7*  HCT 15.7* 22.0* 22.4* 17.3*  PLT <5* -- 6* 4*  MCV 88.2 -- 87.8 88.7    Lab 10/15/11 1230 10/13/11 1615 10/13/11 0223  NA 139 142 143  K 4.4 4.9 5.2*  CL 111 114* 118*  CO2 20 19 --  GLUCOSE 125* 126* 137*  BUN 55* 57* 56*  CREATININE 2.42* 2.38* 2.60*  CALCIUM 8.3* 8.1* --   Medications: Scheduled Meds:   . acetaminophen  650 mg Oral Q6H   Or  . acetaminophen  650 mg Rectal Q6H  . amLODipine  10 mg Oral Daily  . bisoprolol  10 mg Oral Daily  . docusate sodium  100 mg Oral BID  . febuxostat  40 mg Oral Daily  . felodipine  10 mg Oral Daily  . folic acid  1 mg Oral Daily  . hydrALAZINE  50 mg Oral TID  . insulin aspart  0-5 Units Subcutaneous QHS  . insulin aspart  0-9 Units Subcutaneous TID WC  . methenamine  1,000 mg Oral BID  . senna  1 tablet Oral BID  . sertraline  25 mg Oral Daily   Continuous Infusions:   . sodium chloride     PRN Meds:.chlorpheniramine-HYDROcodone, haloperidol, hydrALAZINE, morphine injection, ondansetron (ZOFRAN) IV, ondansetron, polyethylene glycol   LOS: 3 days   [name] 10/15/2011, 4:48 PM  [facility] Pager: [phone]

## 2011-10-15 NOTE — ED Provider Notes (Signed)
Medical screening examination/treatment/procedure(s) were conducted as a shared visit with non-physician practitioner(s) and myself.  I personally evaluated the patient during the encounter This elderly F w MDA now presents following a fall.  Although the patient's radiographic studies did not demonstrate (likely) new findings, the patient has been unable to ambulate.  She was admitted for further e/m.  Gerhard Munch, MD 10/15/11 [phone]

## 2011-10-15 NOTE — Progress Notes (Signed)
Attempted to see pt this am.  Pt/family/MD all in palliative care meeting.  Will check notes to see if OT is still in the plan and will check back as schedule allows. Tory Emerald, OTR/L [phone]

## 2011-10-15 NOTE — Progress Notes (Signed)
   CARE MANAGEMENT NOTE 10/15/2011  Patient:  [name], [name]   Account Number:  [id number]  Date Initiated:  10/13/2011  Documentation initiated by:  [name],Denise  Subjective/Objective Assessment:   pt admitted through er after reported fall.     Action/Plan:   lives at home alone   Anticipated DC Date:  10/17/2011   Anticipated DC Plan:  HOME W HOSPICE CARE  In-house referral  Clinical Social Worker      DC Associate Professor  CM consult      PAC Choice  HOSPICE   Choice offered to / List presented to:  C-4 Adult Children   DME arranged  OTHER - SEE COMMENT      [facility]     HH arranged  NA      HH agency  [facility]   Status of service:  In process, will continue to follow Medicare Important Message given?   (If response is "NO", the following Medicare IM given date fields will be blank) Date Medicare IM given:   Date Additional Medicare IM given:    Discharge Disposition:    Per UR Regulation:  Reviewed for med. necessity/level of care/duration of stay  If discussed at Long Length of Stay Meetings, dates discussed:    Comments:  10-15-11 Denise Noble, RN,BSN,CM [phone] Spoke with NP, [name] this afternoon regarding patient's status. Hgb less than 5 now. Will txfer to pallative care soon. No agressive measures will be taken at this time. [name] with [facility] made aware of patient's status and states we will f/u in the morning regarding pt's condition and status. [facility] does have a inpt bed as of today. Status of txfer to hospice facility vs home with hospice will be readdressed on Friday 10-15-11.     10-15-11 [name] [phone] Received call from [facility] NP stating hospice choices need to be offered. Family chose [facility] [phone] at home, son/Ron's house. Faxed all necessary information attention to [facility] at fax number [phone]. [name] confirmed receipt of information. Pt's  sonFerne [name] cell number is [phone] and home number is [phone]; address: [address] [place], [place]    10-14-11 [name], [place] [phone] GOC meeting sch for tomorrow 10-15-11 at 11am per PMT RN notes. Likely dc to [facility]. Will cont to follow along with CSW.    [id number]/[facility]pt was refered in March of this year but patient refused care or assistance, lives alone, last admission for medical was from a fall in [id number]/sees [name] at the cancer center. Denise Davis,RN,BSN,CCM

## 2011-10-15 NOTE — Telephone Encounter (Signed)
Spoke with Olegario Messier at [facility] [phone]. Ok for dr Clelia Croft to be attending and for hospice md's to do sx management.

## 2011-10-15 NOTE — Progress Notes (Signed)
CRITICAL VALUE ALERT  Critical value received:  hgb 5.3, WBC 0.5, Platlets<5  Date of notification:  10/15/11  Time of notification:  1250  Critical value read back:yes  Nurse who received alert:  J.Rajveer Handler RN  MD notified (1st page): Dr. Elisabeth Pigeon  Time of first page:  On Floor 1250  MD notified (2nd page):  Time of second page:  Responding MD:  same  Time MD responded:  1250

## 2011-10-15 NOTE — Progress Notes (Signed)
Report rec'd from [employer].  Pt transferring to RM 1325.

## 2011-10-15 NOTE — Progress Notes (Signed)
OT Note:  Noted that pt is going to receive hospice services.  She has a critical HgB and will not receive further transfusions.  Will sign off.  [place], OTR/L [phone] 10/15/2011

## 2011-10-16 DIAGNOSIS — F05 Delirium due to known physiological condition: Secondary | ICD-10-CM

## 2011-10-16 DIAGNOSIS — D46Z Other myelodysplastic syndromes: Secondary | ICD-10-CM

## 2011-10-16 DIAGNOSIS — R627 Adult failure to thrive: Secondary | ICD-10-CM

## 2011-10-16 DIAGNOSIS — R5381 Other malaise: Secondary | ICD-10-CM

## 2011-10-16 DIAGNOSIS — R5383 Other fatigue: Secondary | ICD-10-CM

## 2011-10-16 DIAGNOSIS — F0390 Unspecified dementia without behavioral disturbance: Secondary | ICD-10-CM

## 2011-10-16 DIAGNOSIS — N179 Acute kidney failure, unspecified: Secondary | ICD-10-CM

## 2011-10-16 DIAGNOSIS — E119 Type 2 diabetes mellitus without complications: Secondary | ICD-10-CM

## 2011-10-16 NOTE — Progress Notes (Signed)
Patient WU:JWJX [name]      DOB: Sep 05, 1925      BJY:[id number]   Palliative Medicine Team at [facility] Progress Note    Subjective: Patient eating lunch, appears comfortable, family at bedside. [facility] hospice services into speak with family/patient initial plan was for disposition to son's home with hospice care. Patient requesting to change plan for hospice services at her home, son and other family /friends willing to assist with care at patients home. Counts extremley low yesterday, platelet ct reported as <5K/ul.   Filed Vitals:   10/16/11 0607  BP: 181/67  Pulse: 70  Temp: 98.1 F (36.7 C)  Resp: 16   Physical exam: General: Pleasant,tired looking,pale elderly female  HEENT: anicteric, buccal mucous membranes moist CHEST: scattered rhonchi clears with cough, respirations unlabored CVS: bradycardic, rhythm regular, no MGR ABDOMEN: soft, non-tender, BS audible EXT's: extensive bruising on arms bilaterally,  no pedal edema, cool to touch NEURO: oriented x3, responses appropriate   Assessment and plan: 1) Code Status: DNR/DNI  2) Symptom Management:                                                                                                                                                                             Dyspnea/Pain: not dyspneic at rest, denies any pain. Has not required medication since transfer to PCU last evening. Respirations unlabored.  Fatigue: related to anemia, patient assisted by family and staff with ADL's  3) Psycho/Social:emotional support rendered to both patient and family  4) Prognosis/Disposition: days/weeks. Plan to be discharge to her home with family/friends and home hospice support   Time In Time Out Total Time Spent with Patient Total Overall Time  1:00pm 1:30p 30 min 30 min   Greater than 50%  of this time was spent counseling and coordinating care related to the above assessment and plan.  Freddie Breech,  CNS-C Palliative Medicine Team [facility] Team Phone: [phone] Pager: [phone]

## 2011-10-16 NOTE — Progress Notes (Signed)
Patient ID: [name], female   DOB: November 02, 1925, 78 y.o.   MRN: [id number]  Assessment and Plan:   Principal Problem:   * FALL  - based on x ray findings no fractures identified in pelvis; there is an old L4 compression but no change since prior studies  - per PT evaluation - home with hospice - per palliative care - family wishes to proceed with comfort care only   Active Problems:   Myelodysplastic syndrome  - per oncology recommendation patient is becoming transfusion refractory and in favour of stopping transfusion  - as per family no more blood draws and comfort care   Acute on chronic kidney disease, stage IV  - creatinine worse than patient's baseline (1.9 - 2.2)  - creatinine is 2.42  - perhaps this is secondary to prerenal etiology / dehydration   Hypertension  - continue norvasc and bisoprolol   Anemia of chronic disease  - secondary to combination of chronic kidney disease and myelodysplastic syndrome  - hemoglobin dropped to 5.3 again and per family no further transfusions and/or blood draws   Thrombocytopenia  - secondary to MDS  - per oncology recommendation would recommend stopping transfusion   Hyperglycemia  - perhaps stress related   Advance directives  - DNR   Education  - family agrees with comfort care and no more blood draws  - anticipate discharge Sunday, home with home hospice   Consults to date:  1. Oncology (Dr. Clelia [name])  2. Hospice referral  3. Physical Therapy   Subjective: No events overnight. Patient denies chest pain, shortness of breath, abdominal pain.   Objective:  Vital signs in last 24 hours:  Filed Vitals:   10/15/11 1355 10/15/11 1835 10/15/11 1840 10/16/11 0607  BP: 148/47 184/63  181/67  Pulse: 55 64  70  Temp: 98.6 F (37 C) 98.2 F (36.8 C)  98.1 F (36.7 C)  TempSrc: Oral Oral  Oral  Resp: 18 18  16   Height:   5\' 5"  (1.651 m)   Weight:   59.8 kg (131 lb 13.4 oz)   SpO2: 97% 90%  82%    Intake/Output from  previous day:  Intake/Output Summary (Last 24 hours) at 10/16/11 1722 Last data filed at 10/16/11 0900  Gross per 24 hour  Intake   1208 ml  Output      2 ml  Net   1206 ml    Physical Exam: General:Alert, awake, oriented x3, in no acute distress. HEENT: No bruits, no goiter. Moist mucous membranes, no scleral icterus, no conjunctival pallor. Heart: Regular rate and rhythm, S1/S2 +, no murmurs, rubs, gallops. Lungs: Clear to auscultation bilaterally. No wheezing, no rhonchi, no rales.  Abdomen: Soft, nontender, nondistended, positive bowel sounds. Extremities: ecchymotic left hand; pulses palpable. Neuro: Grossly nonfocal.  Lab Results:    Lab 10/15/11 1230 10/13/11 0223 10/13/11 0216  WBC 0.5* -- 0.5*  HGB 5.3* 7.5* 7.6*  HCT 15.7* 22.0* 22.4*  PLT <5* -- 6*  MCV 88.2 -- 87.8    Lab 10/15/11 1230 10/13/11 1615 10/13/11 0223  NA 139 142 143  K 4.4 4.9 5.2*  CL 111 114* 118*  CO2 20 19 --  GLUCOSE 125* 126* 137*  BUN 55* 57* 56*  CREATININE 2.42* 2.38* 2.60*  CALCIUM 8.3* 8.1* --   Medications: Scheduled Meds:   . acetaminophen  650 mg Oral Q6H   Or  . acetaminophen  650 mg Rectal Q6H  . amLODipine  10 mg Oral  Daily  . bisoprolol  10 mg Oral Daily  . docusate sodium  100 mg Oral BID  . febuxostat  40 mg Oral Daily  . felodipine  10 mg Oral Daily  . hydrALAZINE  50 mg Oral TID  . methenamine  1,000 mg Oral BID  . senna  1 tablet Oral BID  . sertraline  25 mg Oral Daily   Continuous Infusions:   . sodium chloride 75 mL/hr at 10/16/11 0109   PRN Meds:.atropine, chlorpheniramine-HYDROcodone, haloperidol, hydrALAZINE, LORazepam, morphine injection, ondansetron (ZOFRAN) IV, ondansetron, polyethylene glycol, DISCONTD:  morphine injection   LOS: 4 days   [name] 10/16/2011, 5:22 PM  [facility] Pager: [phone]

## 2011-10-16 NOTE — Progress Notes (Signed)
Pt, son and daughter in law at bedside selected [facility].  Referral given to in house rep for [facility]. Pt [facility] her mind about [facility] to [facility]. Son states that everyone will take turns being with the pt at home. mp

## 2011-10-16 NOTE — Clinical Documentation Improvement (Signed)
Anemia Documentation Clarification Query  THIS DOCUMENT IS NOT A PERMANENT PART OF THE MEDICAL RECORD  RESPOND TO THE THIS QUERY, FOLLOW THE INSTRUCTIONS BELOW:  1. If needed, update documentation for the patient's encounter via the notes activity.  2. Access this query again and click edit on the In [employer].  3. After updating, or not, click F2 to complete all highlighted (required) fields concerning your review. Select "additional documentation in the medical record" OR "no additional documentation provided".  4. Click Sign note button.  5. The deficiency will fall out of your In Basket *Please let us know if you are not able to complete this workflow by phone or e-mail (listed below).          10/16/11  Dear Dr. Elisabeth Pigeon  Marton Redwood  In an effort to better capture your patient's severity of illness, reflect appropriate length of stay and utilization of resources, a review of the patient medical record has revealed the following indicators.    Based on your clinical judgment, please clarify and document in a progress note and/or discharge summary the clinical condition associated with the following supporting information:  In responding to this query please exercise your independent judgment.  The fact that a query is asked, does not imply that any particular answer is desired or expected. Pt admitted after fall at home . According to H&P with past medical history  "acquired  pancytopenia" and "2. MDS, pancytopenia: Per Dr. Alver Fisher prior recs as noted in [facility], "  If possible, please clarify in progress if  any  listed below specifies pt anemia condition Possible Clinical Conditions?  Pancytopenia Iron deficiency anemia  Anemia due to chronic disease Anemia due to malignancy  Aplastic anemia   Other Condition____________   Cannot Clinically Determine   Supporting Information: Signs and Symptoms: End Stage MDS, CKD IV  Diagnostics: 10/13/11    wbc 0.5   hgb 7.6   hct  22.4   platelets  6 10/15/11    wbc 0.5   hgb 5.3   hct 15.7   platelets   <5  Treatments: Transfusion: pt refuses IV fluids / NS @75ml /hr    You may use possible, probable, or suspect with inpatient documentation. Possible, probable, suspected diagnoses MUST be documented at the time of discharge.  Reviewed: additional documentation in the medical record  Thank You,  Andy Gauss RN  Clinical Documentation Specialist:  Pager [phone] E-mail [email or web address]   Health Information Management [place]

## 2011-10-16 NOTE — Progress Notes (Signed)
10/15/11 patient and family request home hospice services with [facility]. Patient will go home to her house here in [place], if the plan changes for her to go to her sons home, ([place]), the re-consult [facility]

## 2011-10-17 DIAGNOSIS — N179 Acute kidney failure, unspecified: Secondary | ICD-10-CM

## 2011-10-17 DIAGNOSIS — R112 Nausea with vomiting, unspecified: Secondary | ICD-10-CM

## 2011-10-17 DIAGNOSIS — D46Z Other myelodysplastic syndromes: Secondary | ICD-10-CM

## 2011-10-17 DIAGNOSIS — E119 Type 2 diabetes mellitus without complications: Secondary | ICD-10-CM

## 2011-10-17 DIAGNOSIS — F0390 Unspecified dementia without behavioral disturbance: Secondary | ICD-10-CM

## 2011-10-17 DIAGNOSIS — F05 Delirium due to known physiological condition: Secondary | ICD-10-CM

## 2011-10-17 MED ORDER — ONDANSETRON HCL 4 MG PO TABS: 4.0000 mg | ORAL_TABLET | Freq: Four times a day (QID) | ORAL | Status: DC | PRN

## 2011-10-17 MED ORDER — ONDANSETRON HCL 4 MG/2ML IJ SOLN: 4.0000 mg | INTRAMUSCULAR | Status: DC | PRN

## 2011-10-17 NOTE — Progress Notes (Signed)
Patient [name]      DOB: 07-26-1925      EAV:[id number]   Palliative Medicine Team at [facility] Progress Note    Subjective: Visited with [name] earlier this evening she stated that she felt sick to her stomach nursing reported that she had vomited some earlier.  No reports of hematemesis.  She denies pain.  Although is intermittently requiring morphine.     Filed Vitals:   10/17/11 2111  BP: 125/65  Pulse: 63  Temp: 98.4 F (36.9 C)  Resp:    Physical exam:  General: somnolent but easily rousable Pale,  PERRL, EOMI anicteric, mmm Chest decreased with mild end expiratory wheeze CVS: regular, rate and rhythm Abd: slightly distended, positive bowel sounds Ext: cool,  No edema , no mottling   Assessment and plan: 78 year old white female with MDS refractory to transfusion.  Patient has elected to go home with hospice care.  Her family is making arrangements to stay.  No signs of spontaneous bleeding at this time.  1. DNR 2. Refractory Anemia: no further transfusions 3. Nausea: will change zofran to q 4 prn.  Please send an antiemetic home - compazine suppositories or phenergan suppositories. 4. Hypertension: continue antihypertensives as long as she can take them 5. Pain in arm and ribs: currently under control but will leave morphine iv since she is nauseated.  Send script for Roxanol 5 mg (20mg /ml 30 ml bottle) q 2-4 hrs prn for pain or dyspnea  Total time: 15 min Denise Philipson L. Ladona Ridgel, MD MBA The Palliative Medicine Team at [facility] Phone: [phone] Pager: [phone]

## 2011-10-17 NOTE — Progress Notes (Signed)
Patient ID: [name], female   DOB: [birth year], 78 y.o.   MRN: [id number]  Assessment and Plan:   Principal Problem:   * FALL  - based on x ray findings no fractures identified in pelvis; there is an old L4 compression but no change since prior studies  - per PT evaluation - home with hospice  - per palliative care - family wishes to proceed with comfort care only   Active Problems:   Myelodysplastic syndrome  - per oncology recommendation patient is becoming transfusion refractory and in favour of stopping transfusion  - as per family no more blood draws and comfort care   Acute on chronic kidney disease, stage IV  - creatinine worse than patient's baseline (1.9 - 2.2)  - creatinine is 2.42  - perhaps this is secondary to prerenal etiology / dehydration   Hypertension  - continue norvasc and bisoprolol   Anemia of chronic disease  - secondary to combination of chronic kidney disease and myelodysplastic syndrome  - hemoglobin dropped to 5.3 again and per family no further transfusions and/or blood draws   Thrombocytopenia  - secondary to MDS  - per oncology recommendation would recommend stopping transfusion   Hyperglycemia  - perhaps stress related   Advance directives  - DNR   Education  - family agrees with comfort care and no more blood draws  - anticipate discharge Sunday, home with home hospice   Disposition - patient appears little more short of breath than yesterday and appears too unstable to be transported home today  Consults to date:  1. Oncology (Dr. Clelia Croft)  2. Hospice referral  3. Physical Therapy   Subjective: No events overnight. Patient denies chest pain, shortness of breath, abdominal pain. Had bowel movement and reports ambulating.  Objective:  Vital signs in last 24 hours:  Filed Vitals:   10/16/11 0607 10/17/11 0550 10/17/11 0800 10/17/11 1249  BP: 181/67 198/66  198/66  Pulse: 70 78 72   Temp: 98.1 F (36.7 C) 99.7 F (37.6 C)     TempSrc: Oral Oral    Resp: 16 16    Height:      Weight:      SpO2: 82% 91%      Intake/Output from previous day:   Intake/Output Summary (Last 24 hours) at 10/17/11 1537 Last data filed at 10/17/11 0600  Gross per 24 hour  Intake   1850 ml  Output    200 ml  Net   1650 ml    Physical Exam: General:  no acute distress; mild shortness of breath HEENT: No bruits, no goiter. Moist mucous membranes, no scleral icterus, no conjunctival pallor. Heart: Regular rate and rhythm, S1/S2 +, no murmurs, rubs, gallops. Lungs: Clear to auscultation bilaterally. No wheezing, no rhonchi, no rales.  Abdomen: Soft, nontender, nondistended, positive bowel sounds. Extremities: cyanotic left hand. Pulses palpable Neuro: Grossly nonfocal.  Lab Results:  Lab 10/15/11 1230 10/13/11 0223 10/13/11 0216  WBC 0.5* -- 0.5*  HGB 5.3* 7.5* 7.6*  HCT 15.7* 22.0* 22.4*  PLT <5* -- 6*  MCV 88.2 -- 87.8    Lab 10/15/11 1230 10/13/11 1615 10/13/11 0223  NA 139 142 143  K 4.4 4.9 5.2*  CL 111 114* 118*  CO2 20 19 --  GLUCOSE 125* 126* 137*  BUN 55* 57* 56*  CREATININE 2.42* 2.38* 2.60*  CALCIUM 8.3* 8.1* --    Medications: Scheduled Meds:   . acetaminophen  650 mg Oral Q6H   Or  .  acetaminophen  650 mg Rectal Q6H  . amLODipine  10 mg Oral Daily  . bisoprolol  10 mg Oral Daily  . docusate sodium  100 mg Oral BID  . febuxostat  40 mg Oral Daily  . felodipine  10 mg Oral Daily  . hydrALAZINE  50 mg Oral TID  . methenamine  1,000 mg Oral BID  . senna  1 tablet Oral BID  . sertraline  25 mg Oral Daily   Continuous Infusions:   . sodium chloride 75 mL/hr at 10/17/11 0600   PRN Meds:.atropine, chlorpheniramine-HYDROcodone, haloperidol, hydrALAZINE, LORazepam, morphine injection, ondansetron (ZOFRAN) IV, ondansetron, polyethylene glycol   LOS: 5 days   Kristof Nadeem 10/17/2011, 3:37 PM  [facility]ITAL[facility] Pager: [phone]

## 2011-10-18 DIAGNOSIS — R5381 Other malaise: Secondary | ICD-10-CM

## 2011-10-18 DIAGNOSIS — F05 Delirium due to known physiological condition: Secondary | ICD-10-CM

## 2011-10-18 DIAGNOSIS — D46Z Other myelodysplastic syndromes: Secondary | ICD-10-CM

## 2011-10-18 DIAGNOSIS — F0390 Unspecified dementia without behavioral disturbance: Secondary | ICD-10-CM

## 2011-10-18 DIAGNOSIS — R5383 Other fatigue: Secondary | ICD-10-CM

## 2011-10-18 DIAGNOSIS — E119 Type 2 diabetes mellitus without complications: Secondary | ICD-10-CM

## 2011-10-18 DIAGNOSIS — N179 Acute kidney failure, unspecified: Secondary | ICD-10-CM

## 2011-10-18 MED ORDER — MORPHINE SULFATE (CONCENTRATE) 20 MG/ML PO SOLN
5.0000 mg | ORAL | Status: DC | PRN
  Administered 2011-10-19 (×2): 5 mg via ORAL
  Filled 2011-10-18 (×2): qty 1

## 2011-10-18 MED ORDER — SODIUM CHLORIDE 0.9 % IV SOLN: INTRAVENOUS | Status: DC

## 2011-10-18 MED ORDER — FUROSEMIDE 10 MG/ML IJ SOLN: 40.0000 mg | Freq: Once | INTRAMUSCULAR | Status: DC

## 2011-10-18 NOTE — Progress Notes (Signed)
Patient AV:WUJW KITZIA CAMUS      DOB: 1925-07-05      JXB:[id number]   Palliative Medicine Team at [facility] Progress Note    Subjective: [name] is much more awake during my visit today and seemingly since being seen by Dr. Elisabeth Pigeon this am.  She states she feels better than yesterday. States no further nausea and that she was able to eat the better part of her breakfast.  She does complain of pain in her left neck at the sight of an inflamed lymph node. She denies any dental pain, or tongue pain.  She states this has happened in the past and she would take an antibiotic and it would go away.    Filed Vitals:   10/18/11 1016  BP: 138/48  Pulse:   Temp:   Resp:    Physical exam: General: very awake alert and oriented PERRL, EOMI, very pale, no wet purpura or lip lesions Left submandibular lymph node is swollen, and very tender tender to palpation Chest decreased with soft crackles anteriorly bilaterally. CVS: regular rate and rhythm ABdomen: soft not tender not distended Ext: right hand is ecchymotic with some persistent edema.  Patient relates her splint is bothering her.    Assessment and plan: 78 year old white female with refractory anemia secondary to Myleodysplastic syndrome resulting in pancytopenia.  She fell and was admitted to the hospital.  She has decided to go home with hospice care and foregoe further transfusion.  Course was complicated by acute nausea yesterday with decreased mental status that seems to have improved today.  Plan is to go home in the am if she continues to feel reasonably well.  1.  DNR 2. Irritation from her wrist brace -may have a holiday from the brace 3. Nausea: resolved treat prn 4. Lymph node swelling: warm compress will be applied.  WIll report to pcp who can decide if antibiotic orally is warranted. 5.  Crackles on physical exam:  Saline lock ivf.  Total time : 15 min  Discussed with Dr. Lesle Chris L. Ladona Ridgel, MD MBA The Palliative  Medicine Team at [facility] Phone: [phone] Pager: [phone]

## 2011-10-18 NOTE — Progress Notes (Addendum)
Patient ID: LEIA COLETTI, female   DOB: 24-Apr-1926, 78 y.o.   MRN: [id number]  Assessment and Plan:   Principal Problem:   * FALL  - based on x ray findings no fractures identified in pelvis; there is an old L4 compression but no change since prior studies  - per PT evaluation - home with hospice  - per palliative care - family wishes to proceed with comfort care only   Active Problems:   Myelodysplastic syndrome  - per oncology recommendation patient is becoming transfusion refractory and in favour of stopping transfusion  - as per family no more blood draws and comfort care   Acute on chronic kidney disease, stage IV  - creatinine worse than patient's baseline (1.9 - 2.2)  - creatinine is 2.42  - perhaps this is secondary to prerenal etiology / dehydration   Hypertension  - continue norvasc and bisoprolol   Anemia of chronic disease  - secondary to combination of chronic kidney disease and myelodysplastic syndrome  - hemoglobin dropped to 5.3 again and per family no further transfusions and/or blood draws   Thrombocytopenia  - secondary to MDS  - per oncology recommendation would recommend stopping transfusion   Hyperglycemia  - perhaps stress related   Advance directives  - DNR   Education  - family agrees with comfort care and no more blood draws  - anticipate discharge Monday, home with home hospice   Disposition  - patient appears somnolent today and is actively declining and appears too unstable to be transported home today   Consults to date:  1. Oncology (Dr. Clelia Croft)  2. Hospice referral  3. Physical Therapy   Subjective: No events overnight.   Objective:  Vital signs in last 24 hours:   10/17/11 1249 10/17/11 1731 10/17/11 2111 10/18/11 0528  BP: 198/66 184/68 125/65 121/62  Pulse:   63 71  Temp:   98.4 F (36.9 C) 98.1 F (36.7 C)  TempSrc:   Oral Oral  SpO2:   94% 94%    Intake/Output from previous day:   Gross per 24 hour  Intake    1686 ml  Output      0 ml  Net   1686 ml    Physical Exam: General:  no acute distress; somnolent HEENT: No bruits, no goiter. Moist mucous membranes Heart: Regular rate and rhythm, S1/S2 +, no murmurs, rubs, gallops. Lungs: Clear to auscultation bilaterally. No wheezing, no rhonchi, no rales.  Abdomen: Soft, nontender, nondistended, positive bowel sounds. Extremities: cyanotic left hand; pulsus palpable bialteral Neuro: Grossly nonfocal.  Lab Results:  Lab 10/15/11 1230 10/13/11 0223 10/13/11 0216  WBC 0.5* -- 0.5*  HGB 5.3* 7.5* 7.6*  HCT 15.7* 22.0* 22.4*  PLT <5* -- 6*  MCV 88.2 -- 87.8    Lab 10/15/11 1230 10/13/11 1615 10/13/11 0223  NA 139 142 143  K 4.4 4.9 5.2*  CL 111 114* 118*  CO2 20 19 --  GLUCOSE 125* 126* 137*  BUN 55* 57* 56*  CREATININE 2.42* 2.38* 2.60*  CALCIUM 8.3* 8.1* --    Medications: Scheduled Meds:   . acetaminophen  650 mg Oral Q6H   Or  . acetaminophen  650 mg Rectal Q6H  . amLODipine  10 mg Oral Daily  . bisoprolol  10 mg Oral Daily  . docusate sodium  100 mg Oral BID  . febuxostat  40 mg Oral Daily  . felodipine  10 mg Oral Daily  . hydrALAZINE  50 mg  Oral TID  . methenamine  1,000 mg Oral BID  . senna  1 tablet Oral BID  . sertraline  25 mg Oral Daily   Continuous Infusions:   PRN Meds:.atropine, chlorpheniramine-HYDROcodone, haloperidol, hydrALAZINE, LORazepam, morphine injection, ondansetron (ZOFRAN) IV, ondansetron, polyethylene glycol, DISCONTD: ondansetron (ZOFRAN) IV, DISCONTD: ondansetron   LOS: 6 days   Artemis Koller 10/18/2011, 10:13 AM  [facility]PITAL[facility] Pager: [phone]

## 2011-10-19 DIAGNOSIS — D46Z Other myelodysplastic syndromes: Secondary | ICD-10-CM

## 2011-10-19 DIAGNOSIS — F05 Delirium due to known physiological condition: Secondary | ICD-10-CM

## 2011-10-19 DIAGNOSIS — F0390 Unspecified dementia without behavioral disturbance: Secondary | ICD-10-CM

## 2011-10-19 DIAGNOSIS — E119 Type 2 diabetes mellitus without complications: Secondary | ICD-10-CM

## 2011-10-19 DIAGNOSIS — M25559 Pain in unspecified hip: Secondary | ICD-10-CM

## 2011-10-19 DIAGNOSIS — N179 Acute kidney failure, unspecified: Secondary | ICD-10-CM

## 2011-10-19 MED ORDER — HALOPERIDOL 0.5 MG PO TABS: 0.5000 mg | ORAL_TABLET | Freq: Three times a day (TID) | ORAL | Status: AC | PRN

## 2011-10-19 MED ORDER — ATROPINE SULFATE 1 % OP SOLN: 2.0000 [drp] | OPHTHALMIC | Status: AC | PRN

## 2011-10-19 MED ORDER — HYDRALAZINE HCL 50 MG PO TABS: 50.0000 mg | ORAL_TABLET | Freq: Three times a day (TID) | ORAL | Status: AC

## 2011-10-19 MED ORDER — ALPRAZOLAM 0.5 MG PO TABS: 0.5000 mg | ORAL_TABLET | Freq: Every evening | ORAL | Status: AC | PRN

## 2011-10-19 MED ORDER — HYDROCODONE-ACETAMINOPHEN 5-325 MG PO TABS: 1.0000 | ORAL_TABLET | Freq: Four times a day (QID) | ORAL | Status: AC | PRN

## 2011-10-19 MED ORDER — HYDROCOD POLST-CHLORPHEN POLST 10-8 MG/5ML PO LQCR: 5.0000 mL | Freq: Two times a day (BID) | ORAL | Status: AC | PRN

## 2011-10-19 MED ORDER — ACETAMINOPHEN 325 MG PO TABS: 650.0000 mg | ORAL_TABLET | Freq: Four times a day (QID) | ORAL | Status: AC

## 2011-10-19 MED ORDER — DSS 100 MG PO CAPS: 100.0000 mg | ORAL_CAPSULE | Freq: Two times a day (BID) | ORAL | Status: AC

## 2011-10-19 MED ORDER — CIPROFLOXACIN HCL 500 MG PO TABS
500.0000 mg | ORAL_TABLET | Freq: Every day | ORAL | Status: DC
  Filled 2011-10-19: qty 1

## 2011-10-19 MED ORDER — ONDANSETRON HCL 4 MG PO TABS: 4.0000 mg | ORAL_TABLET | Freq: Four times a day (QID) | ORAL | Status: AC | PRN

## 2011-10-19 MED ORDER — AMLODIPINE BESYLATE 10 MG PO TABS: 10.0000 mg | ORAL_TABLET | Freq: Every day | ORAL | Status: AC

## 2011-10-19 MED ORDER — CIPROFLOXACIN 500 MG/5ML (10%) PO SUSR: 500.0000 mg | Freq: Every day | ORAL | Status: AC

## 2011-10-19 MED ORDER — MORPHINE SULFATE (CONCENTRATE) 20 MG/ML PO SOLN: 5.0000 mg | ORAL | Status: AC | PRN

## 2011-10-19 MED ORDER — CIPROFLOXACIN 500 MG/5ML (10%) PO SUSR: 500.0000 mg | Freq: Every day | ORAL | Status: DC

## 2011-10-19 NOTE — Progress Notes (Signed)
Patient XB:MWUX JULIEN BERRYMAN      DOB: April 14, 1926      LKG:[id number]   Palliative Medicine Team at [facility] Progress Note    Subjective: Tanishka is denies any nausea this morning, is having pain in left hip and legs when turned and placed on bedpan. She appears lethargic, but responds appropriately and opens eyes to questions. Family members present, awaiting discharge to home with hospice. Per staff eating sporadically, did not eat breakfast this morning, drinking fluids when offered.   Filed Vitals:   10/19/11 0600  BP: 147/60  Pulse: 60  Temp: 97.7 F (36.5 C)  Resp: 18   Physical exam  General: :Lethargic, verbally responsive and oriented. HEENT: anicteric, buccal mucosa dry, PERRL, LAD left submandibular LN, slightly tender to palpation CHEST: diminished at bases, fine crackles bilaterally, respirations unlabored. CVS: RRR, no audible MGR ABDOMEN: soft, non-tender, BS audible EXT's: R hand and arm with non-pitting edema, areas of ecchymosis on arms  Assessment and plan: Azura is an 78 yo WF with history pertinent for MDS, now refractory to transfusions, and with severe pancytopenia. She was admitted 10/12/11 s/p fall at home. Patient and family decision for no further transfusion, to go home with hospice care.  1) Code Status: DNR 2) Pain: experiencing pain with movement in hips and legs, ordered indwelling foley for comfort prior to discharge home. Pain Medication use:      Roxinol total use 10 mg/24 hrs. Prescription written for discharge. 3) Lymph Adenopathy: placed on 10 day course of Ciproflaxacin, per patient area remains tender, but improved less tender since yesterday. Prescription written for discharge. 4) Nausea: no futher episodes per patient and staff since Saturday. Ondansteron presciption written for discharge.  Time In Time Out Total Time Spent with Patient Total Overall Time  11am 11:30a 15 min 30 min   Greater than 50%  of this time was spent counseling and  coordinating care related to the above assessment and plan.  Freddie Breech, CNS-C Palliative Medicine Team [facility] Team Phone: [phone] Pager: [phone]

## 2011-10-19 NOTE — Progress Notes (Signed)
Pt changed her mind and chose [facility] for home hospice serv ices per CM RN Cookie.   Pt to go home with friend/caregiver [name] and son/dtr in law to alternate 24/7 care.   DME ordered through [facility] Mayra Reel, package A-complete specifying 1/2 rails, APPMatteress.Benay Pillow for [facility] is friend/caregiver [name] @ [phone] and son Nino Parsley [phone].  Expected discharge 5/18-5/19.   Please call PMT phone [phone] with any questions prior to discharge.   Please call [facility] @ [phone] when discharge confirmed for [facility] RN to see for admission. Chalmers Cater, RN Liaison- [phone]

## 2011-10-19 NOTE — Discharge Summary (Signed)
Patient ID: [name] MRN: [id number] DOB/AGE: [birth year] 78 y.o.  Admit date: 10/12/2011 Discharge date: 10/19/2011  Primary Care Physician:  Judie Petit, MD, MD  Assessment and Plan:   Principal Problem:   * FALL  - based on x ray findings no fractures identified in pelvis; there is an old L4 compression but no change since prior studies  - per PT evaluation - home with hospice  - per palliative care - family wishes to proceed with comfort care only   Active Problems:   Myelodysplastic syndrome  - per oncology recommendation patient is becoming transfusion refractory and in favour of stopping transfusion  - as per family no more blood draws and comfort care only  Left Lymph Node swelling - still tender to touch - we will provide cipro oral suspension for next 10 days upon discahrge  Acute on chronic kidney disease, stage IV  - creatinine worse than patient's baseline (1.9 - 2.2)  - last creatinine is 2.42   Hypertension  - continue norvasc and bisoprolol   Anemia of chronic disease  - secondary to combination of chronic kidney disease and myelodysplastic syndrome  - hemoglobin dropped to 5.3 again and per family no further transfusions and/or blood draws   Thrombocytopenia  - secondary to MDS  - per oncology recommendation would recommend stopping transfusion   Hyperglycemia  - perhaps stress related   Advance directives  - DNR   Education  - family agrees with comfort care and no more blood draws   Disposition  - home today with hospice  Consults to date:  1. Oncology (Dr. Clelia Croft)  2. Hospice referral  3. Physical Therapy    Medication List  As of 10/19/2011  8:37 AM   TAKE these medications         acetaminophen 325 MG tablet   Commonly known as: TYLENOL   Take 2 tablets (650 mg total) by mouth every 6 (six) hours.      ALPRAZolam 0.5 MG tablet   Commonly known as: XANAX   Take 1 tablet (0.5 mg total) by mouth at bedtime as needed.      amLODipine 10 MG tablet   Commonly known as: NORVASC   Take 1 tablet (10 mg total) by mouth daily.      atropine 1 % ophthalmic solution   Place 2 drops into both eyes every 4 (four) hours as needed (for terminal secretions).      bisoprolol 10 MG tablet   Commonly known as: ZEBETA   Take 10 mg by mouth daily.      chlorpheniramine-HYDROcodone 10-8 MG/5ML Lqcr   Commonly known as: TUSSIONEX   Take 5 mLs by mouth every 12 (twelve) hours as needed.      ciprofloxacin 500 MG/5ML (10%) suspension   Commonly known as: CIPRO   Take 5 mLs (500 mg total) by mouth daily.      Cranberry 500 MG Caps   Take 1 capsule by mouth daily.      DSS 100 MG Caps   Take 100 mg by mouth 2 (two) times daily.      famotidine 40 MG tablet   Commonly known as: PEPCID   Take 1 tablet (40 mg total) by mouth daily.      felodipine 10 MG 24 hr tablet   Commonly known as: PLENDIL   Take 1 tablet (10 mg total) by mouth daily.      folic acid 1 MG tablet   Commonly known  as: FOLVITE   Take 1 tablet (1 mg total) by mouth daily.      haloperidol 0.5 MG tablet   Commonly known as: HALDOL   Take 1-2 tablets (0.5-1 mg total) by mouth every 8 (eight) hours as needed.      hydrALAZINE 50 MG tablet   Commonly known as: APRESOLINE   Take 1 tablet (50 mg total) by mouth 3 (three) times daily.      HYDROcodone-acetaminophen 5-325 MG per tablet   Commonly known as: NORCO   Take 1 tablet by mouth every 6 (six) hours as needed. For pain      methenamine 1 G tablet   Commonly known as: MANDELAMINE   Take 1,000 mg by mouth Twice daily.      morphine 20 MG/ML concentrated solution   Commonly known as: ROXANOL   Take 0.25 mLs (5 mg total) by mouth every 2 (two) hours as needed for pain.      ondansetron 4 MG tablet   Commonly known as: ZOFRAN   Take 1 tablet (4 mg total) by mouth every 6 (six) hours as needed for nausea.      sertraline 25 MG tablet   Commonly known as: ZOLOFT   Take 25 mg by mouth  daily.      ULORIC 40 MG tablet   Generic drug: febuxostat   Take 1 tablet by mouth daily.      vitamin C 1000 MG tablet   Take 1,000 mg by mouth daily.            Significant Diagnostic Studies:  Dg Chest 2 View 10/13/2011  *RADIOLOGY REPORT*  Clinical Data: The patient fell and hurts all over.  CHEST - 2 VIEW  Comparison: 08/02/2011  Findings: Emphysema with scattered fibrosis in the lungs.  Mild cardiac enlargement with prominent pulmonary vascularity and interstitial changes suggesting edema.  This is progressing since the previous study.  Atelectasis or fluid in the minor fissure. This is increased.  No blunting of costophrenic angles.  No pneumothorax.  Calcified and tortuous aorta.  Healing fracture of the right lateral fifth rib.  IMPRESSION: Chronic emphysematous changes and fibrosis in the lungs.  Cardiac enlargement with suggestion of developing pulmonary vascular congestion and edema.  Healing right rib fracture.   Dg Lumbar Spine Complete 10/13/2011  *RADIOLOGY REPORT*  Clinical Data: The patient fell and hurt all over.  LUMBAR SPINE - COMPLETE 4+ VIEW  Comparison: 09/02/2011  Findings: Five lumbar type vertebra.  Scoliosis convex towards the left.  No abnormal anterior subluxation.  Diffuse bone demineralization.  Biconcave endplates at L4, stable since previous study suggesting chronic compression.  No acute compression deformities demonstrated.  Degenerative changes throughout the lumbar spine with narrowed lumbar interspaces and associated endplate hypertrophic changes.  No focal bone lesion or bone destruction.  Vascular calcifications.  Stable appearance since previous study.  IMPRESSION: Degenerative changes, scoliosis, and old L4 compression.  No significant change since previous study.    Dg Pelvis 1-2 Views 10/12/2011  *RADIOLOGY REPORT*  Clinical Data: Right hip pain after fall today.  PELVIS - 1-2 VIEW  Comparison: Abdomen 10/28/2009  Findings: Postoperative changes with  right total hip arthroplasty and internal fixation of the intertrochanteric healed fracture of the left hip.  There is lucency at the bone cement interface of the femoral component of the right hip arthroplasty but this appears stable since the prior study.  Chronic loosening is not excluded. No acute fracture or subluxation is demonstrated.  Visualized pelvis and sacral struts appear intact.  Calcified phleboliths in the pelvis.  Diffuse bone demineralization.  IMPRESSION: Postoperative changes in both hips.  No evidence of acute fracture or subluxation.   Dg Shoulder Right 10/12/2011  *RADIOLOGY REPORT*  Clinical Data: Right shoulder pain after fall.  RIGHT SHOULDER - 2+ VIEW  Comparison: 08/02/2011  Findings: The right shoulder appears intact.  No evidence of acute fracture or subluxation.  Decreased sub acromial space may represent chronic rotator cuff arthropathy.  Incidental note of fibrosis in the lungs.  No significant change since previous study.  IMPRESSION: No acute bony abnormalities.    Dg Hand Complete Left 10/13/2011  *RADIOLOGY REPORT*  Clinical Data: Left hand swelling after fall.  LEFT HAND - COMPLETE 3+ VIEW  Comparison: 05/03/2004  Findings: Diffuse bone demineralization.  Degenerative changes in the left hand and wrist involving the radial carpal, STT, first carpometacarpal, first metacarpal phalangeal, and multiple interphalangeal joints.  There is deformity of the trapezium which is chronic. Small calcifications project over the medial dorsal aspect of the wrist may represent cartilaginous calcification or small avulsion fragments.  Degenerative changes have progressed since the previous study.  No evidence of acute fracture or subluxation.  IMPRESSION: Degenerative changes throughout the left hand and wrist, progressing since the previous study.  Calcification over the dorsum of the wrist which might represent avulsion fractures or cartilaginous calcification.     Brief H and P: 86yoF  with multiple medical co morbidities including but not limited to end stage MDS, renal cell ca s/p RFA, systolic CHF who presented to ED status post fall. Hospital course complicated by profound thrombocytopenia (platelet count <5) but patient is transfusion refractory and per family request and patient wishes we have proceeded with comfort care only and no transfusions.  Physical Exam on Discharge:  Filed Vitals:   10/18/11 0528 10/18/11 1016 10/18/11 1955 10/19/11 0600  BP: 121/62 138/48 142/64 147/60  Pulse: 71   60  Temp: 98.1 F (36.7 C)   97.7 F (36.5 C)  TempSrc: Oral   Oral  Resp: 18   18  Height:      Weight:      SpO2: 94%   95%     Intake/Output Summary (Last 24 hours) at 10/19/11 0837 Last data filed at 10/19/11 0500  Gross per 24 hour  Intake    300 ml  Output      0 ml  Net    300 ml    General:  no acute distress, somewhat sonolent HEENT: No bruits, no goiter; tender swollen left lymph node Heart: Regular rate and rhythm, without murmurs, rubs, gallops. Lungs: Clear to auscultation bilaterally. Abdomen: Soft, nontender, nondistended, positive bowel sounds. Extremities: No clubbing cyanosis or edema with positive pedal pulses. Neuro: Grossly intact, nonfocal.  CBC:    Component Value Date/Time   WBC 0.5* 10/15/2011 1230   WBC 0.5* 10/09/2011 1008   HGB 5.3* 10/15/2011 1230   HGB 5.7* 10/09/2011 1008   HCT 15.7* 10/15/2011 1230   HCT 17.3* 10/09/2011 1008   PLT <5* 10/15/2011 1230   PLT 4* 10/09/2011 1008   MCV 88.2 10/15/2011 1230   MCV 88.7 10/09/2011 1008   NEUTROABS 0.1* 10/09/2011 1008   NEUTROABS 0.4* 08/02/2011 1803   LYMPHSABS 0.0* 10/13/2011 0216   LYMPHSABS 0.3* 10/09/2011 1008   MONOABS 0.0* 10/13/2011 0216   MONOABS 0.0* 10/09/2011 1008   EOSABS 0.0 10/13/2011 0216   EOSABS 0.0 10/09/2011 1008   BASOSABS 0.0  10/13/2011 0216   BASOSABS 0.0 10/09/2011 1008    Basic Metabolic Panel:    Component Value Date/Time   NA 139 10/15/2011 1230   K 4.4  10/15/2011 1230   CL 111 10/15/2011 1230   CO2 20 10/15/2011 1230   BUN 55* 10/15/2011 1230   CREATININE 2.42* 10/15/2011 1230   GLUCOSE 125* 10/15/2011 1230   CALCIUM 8.3* 10/15/2011 1230    Time spent on Discharge: 30 minutes  Signed: Manson Passey 10/19/2011, 8:37 AM

## 2011-10-19 NOTE — Discharge Instructions (Signed)
Thrombocytopenia Thrombocytopenia is a condition in which there is an abnormally small number of platelets in your blood. Platelets are also called thrombocytes. Platelets are needed for blood clotting. CAUSES Thrombocytopenia is caused by:   Decreased production of platelets. This can be caused by:   Aplastic anemia in which your bone marrow quits making blood cells.   Cancer in the bone marrow.   Use of certain medicines, including chemotherapy.   Infection in the bone marrow.   Heavy alcohol consumption.   Increased destruction of platelets. This can be caused by:   Certain immune diseases.   Use of certain drugs.   Certain blood clotting disorders.   Certain inherited disorders.   Certain bleeding disorders.   Pregnancy.   Having an enlarged spleen (hypersplenism). In hypersplenism, the spleen gathers up platelets from circulation. This means the platelets are not available to help with blood clotting. The spleen can enlarge due to cirrhosis or other conditions.  SYMPTOMS  The symptoms of thrombocytopenia are side effects of poor blood clotting. Some of these are:  Abnormal bleeding.   Nosebleeds.   Heavy menstrual periods.   Blood in the urine or stools.   Purpura. This is a purplish discoloration in the skin produced by small bleeding vessels near the surface of the skin.   Bruising.   A rash that may be petechial. This looks like pinpoint, purplish-red spots on the skin and mucous membranes. It is caused by bleeding from small blood vessels (capillaries).  DIAGNOSIS  Your caregiver will make this diagnosis based on your exam and blood tests. Sometimes, a bone marrow study is done to look for the original cells (megakaryocytes) that make platelets. TREATMENT  Treatment depends on the cause of the condition.  Medicines may be given to help protect your platelets from being destroyed.   In some cases, a replacement (transfusion) of platelets may be required  to stop or prevent bleeding.   Sometimes, the spleen must be surgically removed.  HOME CARE INSTRUCTIONS   Check the skin and linings inside your mouth for bruising or bleeding as directed by your caregiver.   Check your sputum, urine, and stool for blood as directed by your caregiver.   Do not return to any activities that could cause bumps or bruises until your caregiver says it is okay.   Take extra care not to cut yourself when shaving or when using scissors, needles, knives, and other tools.   Take extra care not to burn yourself when ironing or cooking.   Ask your caregiver if it is okay for you to drink alcohol.   Only take over-the-counter or prescription medicines as directed by your caregiver.   Notify all your caregivers, including dentists and eye doctors, about your condition.  SEEK IMMEDIATE MEDICAL CARE IF:   You develop active bleeding from anywhere in your body.   You develop unexplained bruising or bleeding.   You have blood in your sputum, urine, or stool.  MAKE SURE YOU:  Understand these instructions.   Will watch your condition.   Will get help right away if you are not doing well or get worse.  Document Released: 05/18/2005 Document Revised: 05/07/2011 Document Reviewed: 03/20/2011 [facility] Patient Information 2012 [place], [place].  Failure to Thrive Failure to Thrive (FTT) is a condition in a baby or child that relates to the child's failure to grow (mentally, physically or emotionally). It also relates to gains in height and weight as expected for the child's age. It usually  is noticed from infancy to the age of 35. When the child is far below normal height and weight gains for his/her age, he or she should be evaluated medically, physically and psychologically. However, a child may be growing at a normal rate but be short in stature due to heredity. There may be a normal delay in growth that usually catches up with their peers at puberty or afterward.   CAUSES   Medical - History of premature birth, infection, newborn illnesses, endocrine gland disorders, and/or chromosome and genetic disorders.   Physical - Child abuse and/or child neglect, inability to suck or swallow, reflux, allergies, exposure to certain medicines before birth, and possible exposure to toxic chemicals.   Psychological - Behavioral, psychological problems with the parents and/or child, adolescent or single parents.  DIAGNOSIS   Detailed information about the pregnancy and any problems that developed while your child was in the nursery.   Detailed information about your child's feeding habits.   Physical examination of the child.   Blood and urine tests.   Psychological tests to evaluate child's emotional condition.   X-rays.  TREATMENT   The earlier the evaluation and diagnosis is made, the more effective the treatment will be.   The treatment should be directed to the problem(s). This may require medical, physical or psychological treatment.  HOME CARE INSTRUCTIONS   Take your child for regular well child checkups.   Work with a nutritionist to evaluate the child's dietary needs.   Keep a log or diary of your child's eating habits.   Point out the positive things that occur with your child.   Help your child cope with setbacks and teasing at school and with friends.   Teach your child to do things on his/her own. Reward the child with compliments after succeeding.  SEEK MEDICAL CARE IF:   Your child's weight drops.   Your child does not have a normal appetite.   Your child develops behavioral problems.   Your child becomes more hyperactive.   Your child seems to be developing social and emotional problems in school and with friends.  MAKE SURE YOU:   Understand these instructions.   Will watch your condition.   Will get help right away if you are not doing well or get worse.  Document Released: 03/17/2007 Document Revised: 05/07/2011  Document Reviewed: 03/17/2007 [facility] Patient Information 2012 [place], [place].

## 2011-10-20 NOTE — Progress Notes (Signed)
CSW was consulted to look into potential Hospice placement for the family. The family is unsure about hospice placement and feel more about taking the pt home. CSW will continue to observe and assist as needed.   Kayleen Memos. Leighton Ruff [phone]

## 2011-10-20 NOTE — Progress Notes (Signed)
Referral received for placement per team meeting, pt plans to D/C home. CSW will sign off, please re consult of CSW needs arise.   Kayleen Memos. Leighton Ruff [phone]

## 2011-10-22 ENCOUNTER — Encounter: Payer: Self-pay | Admitting: *Deleted

## 2011-10-22 NOTE — Progress Notes (Signed)
[facility] called.  Patient deceased as of 2011/11/13 at 2050/11/21.

## 2011-10-23 ENCOUNTER — Ambulatory Visit: Payer: Medicare Other | Admitting: Oncology

## 2011-10-23 ENCOUNTER — Other Ambulatory Visit: Payer: Medicare Other | Admitting: Lab

## 2011-10-31 DEATH — deceased

## 2012-09-01 IMAGING — CR DG LUMBAR SPINE COMPLETE 4+V
5 series · 5 of 5 positions shown · non-contrast
Comparison: CT abdomen and pelvis 08/03/2011

CLINICAL DATA: Low back pain after fall.

LUMBAR SPINE - COMPLETE 4+ VIEW

[t l-spine a.p.]
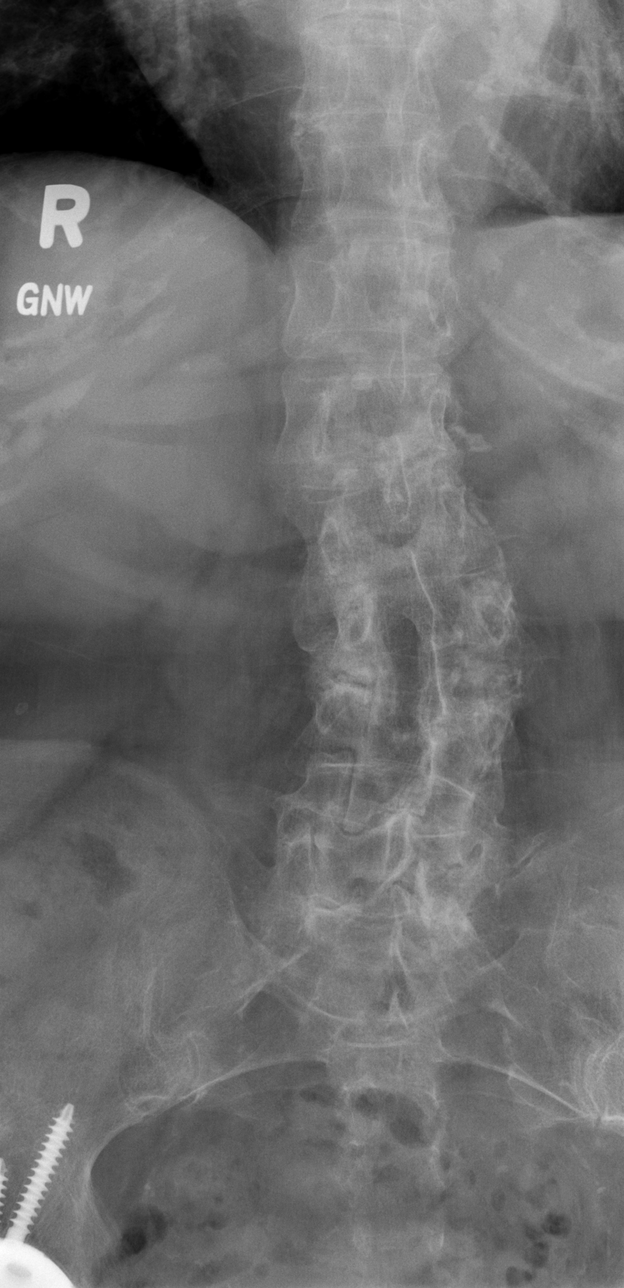

[t l-spine oblique exposure *]
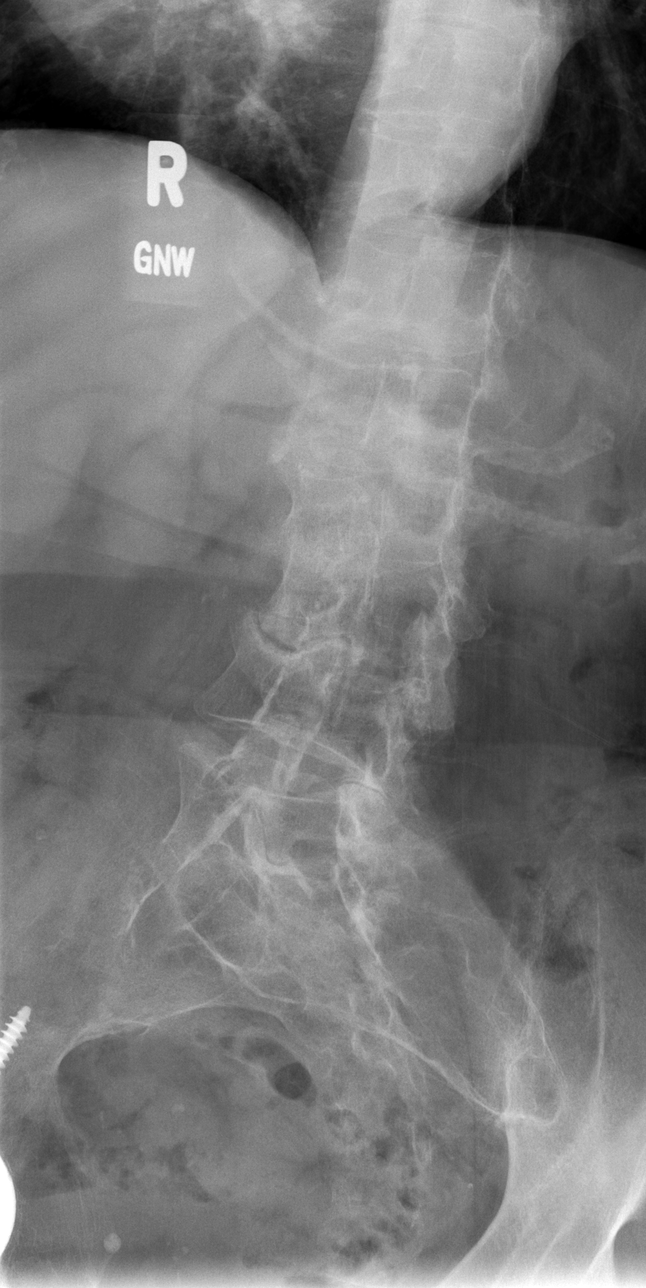

[t l-spine oblique exposure]
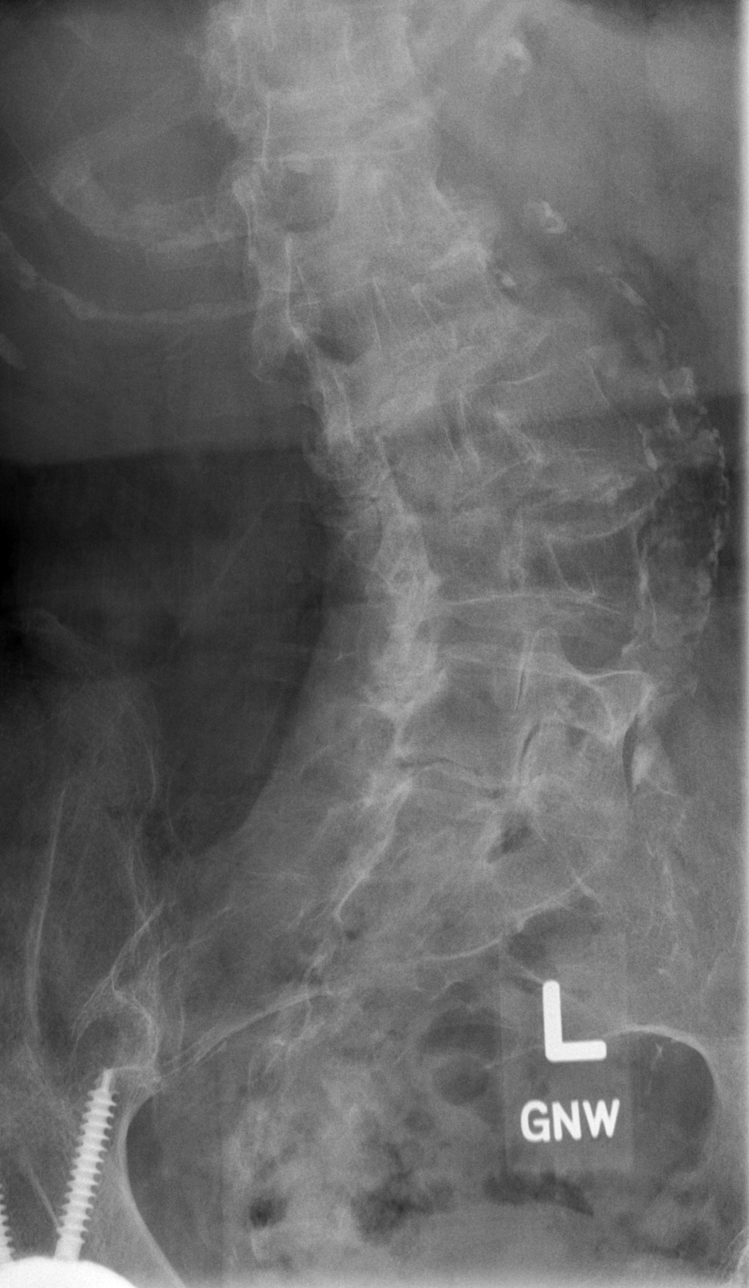

[t l-spine lat]
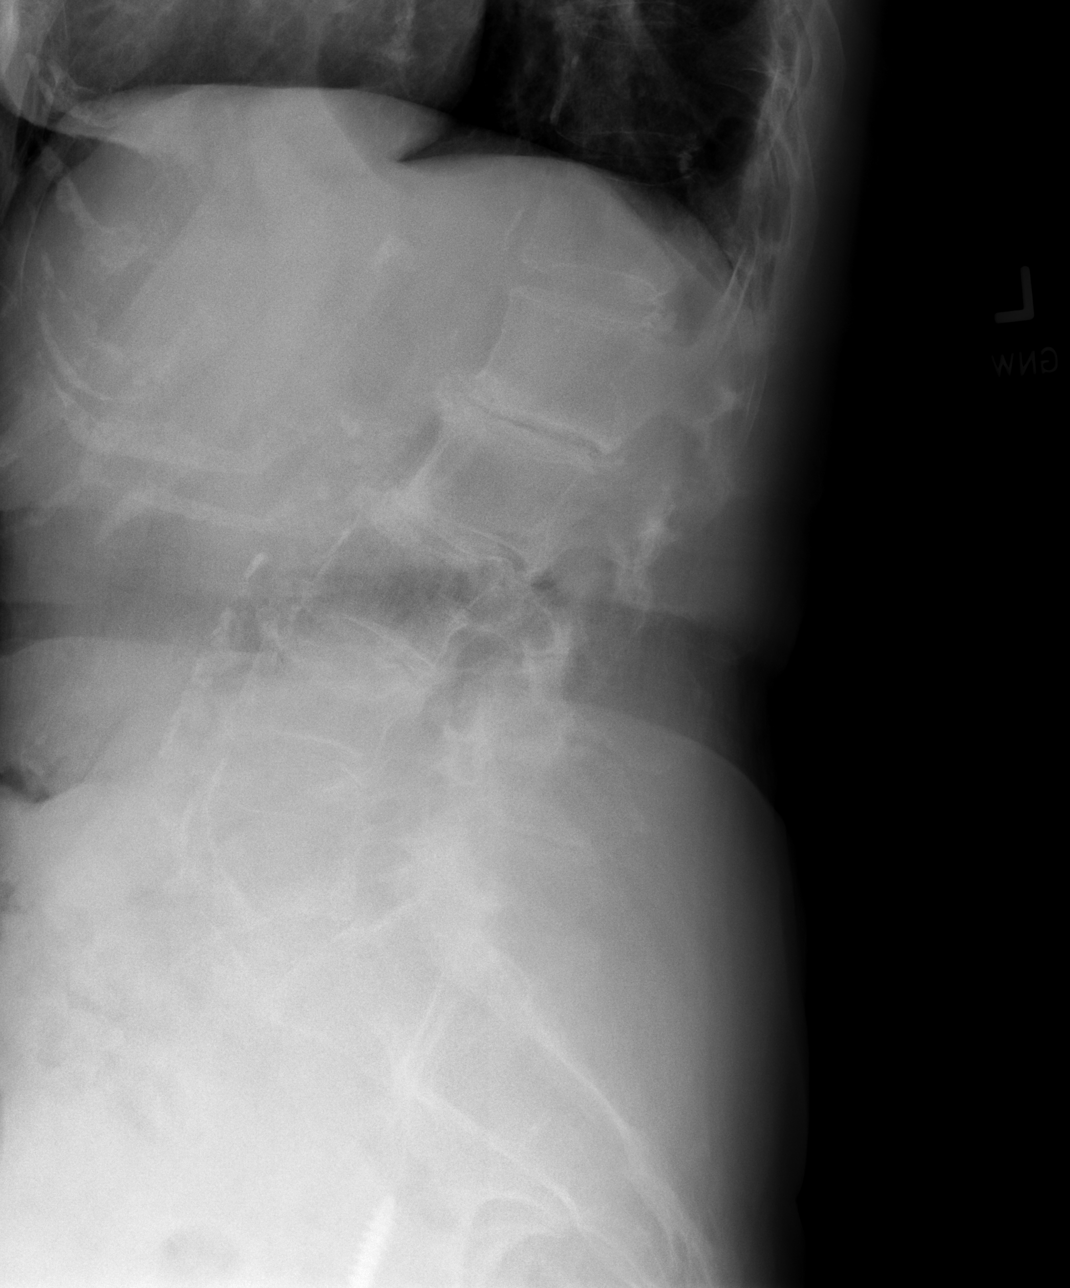

[t l-spine l5-s1 spot]
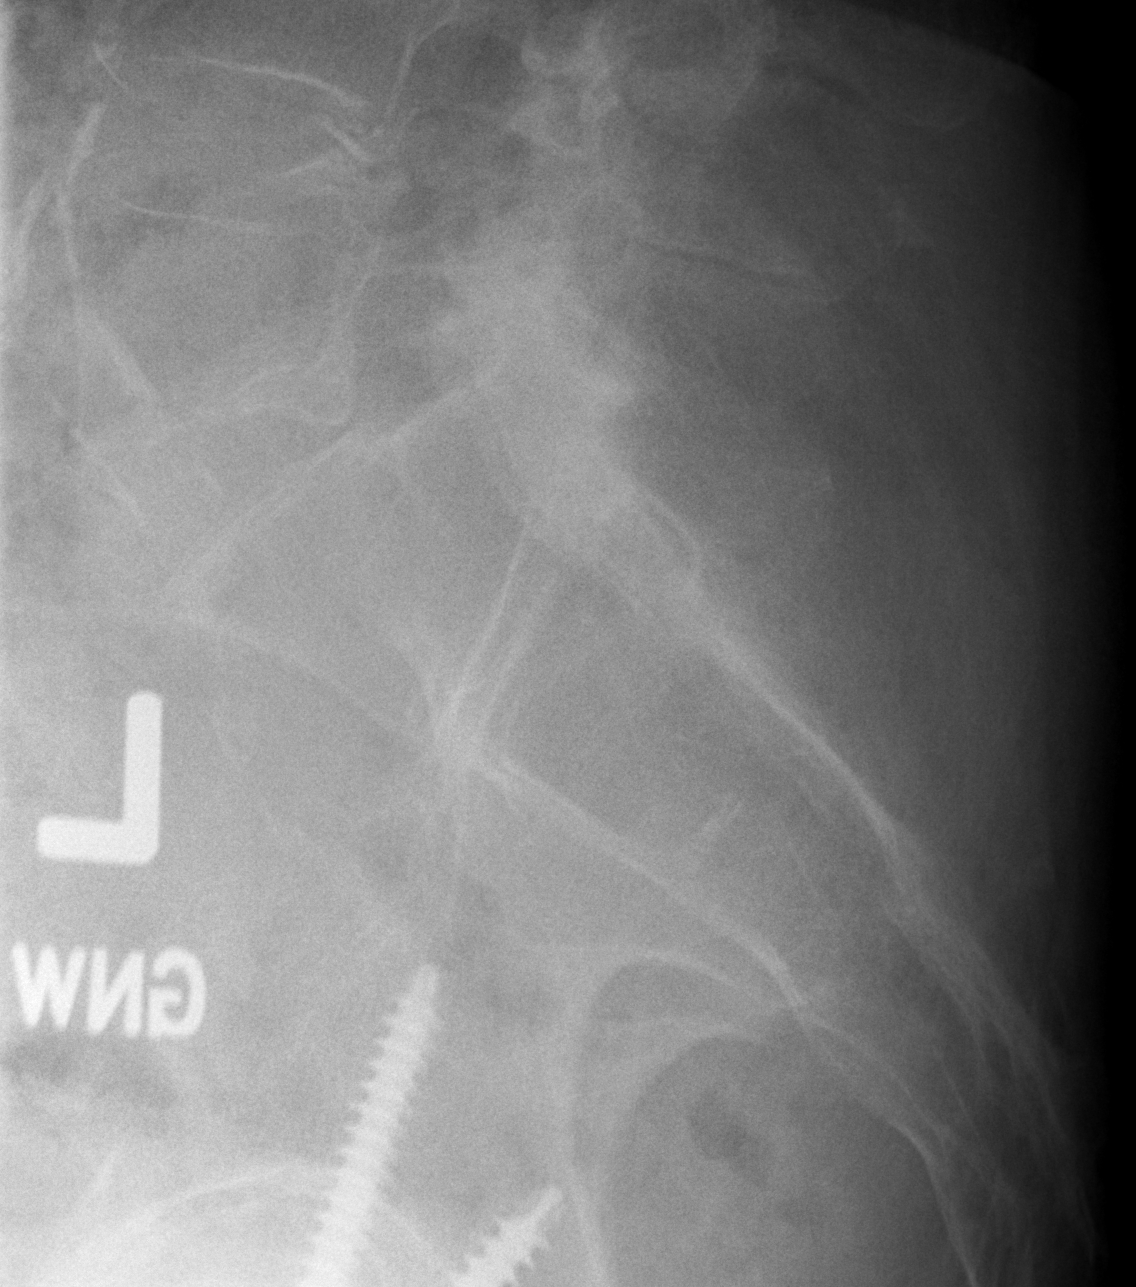

[5 of 5 positions shown; findings below may reference images not displayed]

FINDINGS: Lumbar scoliosis convex towards the left.  Degenerative
changes throughout the lumbar spine with narrowed interspaces and
associated endplate hypertrophic changes.  Compression of the L4
vertebra, stable since previous study.  No abnormal anterior
subluxation of cervical vertebrae.  Degenerative changes in the
facet joints.  Vascular calcifications.  Postoperative changes in
the right hip.
IMPRESSION: Diffuse degenerative changes and scoliosis of the lumbar spine.
Old compression deformity of L4.  No acute displaced fractures
identified.
